# Patient Record
Sex: Male | Born: 1950 | Race: White | Hispanic: No | Marital: Married | Smoking: Never smoker
Health system: Southern US, Community
[De-identification: ages and names within clinical notes are randomized; demographics above are authoritative.]

## PROBLEM LIST (undated history)

## (undated) DIAGNOSIS — I10 Essential (primary) hypertension: Secondary | ICD-10-CM

## (undated) DIAGNOSIS — I455 Other specified heart block: Secondary | ICD-10-CM

## (undated) HISTORY — PX: SHOULDER ARTHROSCOPY: SHX128

---

## 2006-01-24 ENCOUNTER — Encounter: Admission: RE | Admit: 2006-01-24 | Discharge: 2006-01-24 | Payer: Self-pay | Admitting: *Deleted

## 2006-03-20 ENCOUNTER — Ambulatory Visit (HOSPITAL_BASED_OUTPATIENT_CLINIC_OR_DEPARTMENT_OTHER): Admission: RE | Admit: 2006-03-20 | Discharge: 2006-03-20 | Payer: Self-pay | Admitting: Orthopedic Surgery

## 2006-12-08 ENCOUNTER — Emergency Department (HOSPITAL_COMMUNITY): Admission: EM | Admit: 2006-12-08 | Discharge: 2006-12-08 | Payer: Self-pay | Admitting: *Deleted

## 2012-02-26 ENCOUNTER — Ambulatory Visit (INDEPENDENT_AMBULATORY_CARE_PROVIDER_SITE_OTHER): Payer: BC Managed Care – PPO | Admitting: Sports Medicine

## 2012-02-26 ENCOUNTER — Encounter: Payer: Self-pay | Admitting: Sports Medicine

## 2012-02-26 VITALS — BP 123/79 | Ht 69.0 in | Wt 185.0 lb

## 2012-02-26 DIAGNOSIS — M25519 Pain in unspecified shoulder: Secondary | ICD-10-CM

## 2012-02-26 DIAGNOSIS — M67919 Unspecified disorder of synovium and tendon, unspecified shoulder: Secondary | ICD-10-CM

## 2012-02-26 NOTE — Progress Notes (Signed)
  Subjective:    Patient ID: Charles Ellison, male    DOB: 1950-08-22, 61 y.o.   MRN: [id number]  HPI patient is a 61 year old right-hand-dominant male that comes in today complaining of 1 year of right shoulder pain. No recent trauma, but he does state that he fell on his right shoulder on some ice several years ago. Main complaint is a diffuse pain deep in the shoulder which is worse with activities such as throwing a baseball. He's begun to notice some limited range of motion as well some weakness in this arm. No neck pain, no associated numbness or tingling. No significant nighttime pain. His primary care physician has prescribed him some physical therapy but no other treatment. No prior surgery on the right shoulder, but he did have surgery on the left shoulder done by Dr. Teressa Senter several years ago. This sounds like it was a rotator cuff debridement. He has done well postoperatively.   He takes lisinopril for hypertension. No known drug allergies. Family history noncontributory. 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    Review of Systems     Objective:   Physical Exam Well-developed and well-nourished, no acute distress. Awake alert and oriented x3. Right shoulder: Active forward flexion to 160, active abduction to 140. External rotation is 70. Internal rotation is 90 but painful. Negative Spurling's. No tenderness over the contour of the a.c. Joint. No tenderness over the bicipital groove. His strength is 5/5 and slightly reproducible pain with resisted supraspinatus. Negative O'Brien's. No muscle atrophy skin is intact. He is neurovascularly intact distally.  Left shoulder: Full range of motion, no tenderness to palpation. Strength is 5/5, sensation is intact to light-touch. Skin is intact. Neurovascular intact distally.  MSK ultrasound right shoulder: Ultrasound images of the right shoulder were obtained in both  the longitudinal and transverse planes. Patient shows areas of hypoechogenicity in the supraspinatus tendon which may suggest a small tear here. I do not appreciate any full-thickness rotator cuff tear. Biceps tendon sheath has a fair amount of fluid but no discrete tear is seen. This could suggest possible labral pathology. The humeral joint was visualized the labrum was difficult to see. Subscapularis and interspinalis tendons appear to be intact. A.c. joint has small amount of fluid.       Assessment & Plan:  #1. Right shoulder pain secondary to rotator cuff tendinopathy versus possible degenerative labral tear  After informed consent the patient's right shoulder was prepped in a sterile fashion. Intra-articular cortisone injection was administered into the right glenohumeral joint. This was 40 mg of Depo-Medrol and 3 cc of Marcaine. Patient tolerated the procedure without any difficulty. Patient will continue with range of motion exercises at home and will followup with me in 4 weeks. If symptoms persist we may consider merits of MRI arthrogram to rule out a degenerative labral tear. Call questions or concerns in the interim.

## 2012-03-25 ENCOUNTER — Ambulatory Visit (INDEPENDENT_AMBULATORY_CARE_PROVIDER_SITE_OTHER): Payer: BC Managed Care – PPO | Admitting: Sports Medicine

## 2012-03-25 ENCOUNTER — Encounter: Payer: Self-pay | Admitting: Sports Medicine

## 2012-03-25 VITALS — BP 129/81 | HR 50 | Ht 69.0 in | Wt 185.0 lb

## 2012-03-25 DIAGNOSIS — M25511 Pain in right shoulder: Secondary | ICD-10-CM

## 2012-03-25 DIAGNOSIS — M25519 Pain in unspecified shoulder: Secondary | ICD-10-CM

## 2012-03-25 NOTE — Patient Instructions (Addendum)
You have been scheduled for an appointment for MRI 03/26/12 at 4:30 pm at [facility] [address] [place] [address].  Phone number is [phone].  Please go to their office today to have an x-ray of you shoulder.   Thank you for seeing [place] today!

## 2012-03-25 NOTE — Progress Notes (Addendum)
  Subjective:    Patient ID: Charles Ellison, male    DOB: 1951/07/26, 61 y.o.   MRN: [id number]  HPI Patient comes in today for followup on his right shoulder pain. Still struggling with pain despite subacromial cortisone injection. Recent muscle skeletal ultrasound suggested a possible partial thickness rotator cuff tear. He's had one year of symptoms. Has failed conservative treatment to date including physical therapy. He had a similar problem in the left shoulder several years ago that responded well to surgery.   Review of Systems     Objective:   Physical Exam Fairly good range of motion but some limitation with internal rotation. Markedly positive Hawkins. Slightly positive empty can. Rotator cuff strength 5/5. Neurovascular is intact distally.       Assessment & Plan:  Chronic right shoulder pain likely secondary to partial thickness rotator cuff tear-rule out full-thickness rotator cuff  History of prior left shoulder arthroscopy done by Dr. Teressa Senter  MRI of the right shoulder. AP and outlet x-rays. I expect that I will be referring him to Dr. Teressa Senter I will call him with MRI findings once available we will delineate further treatment at that time.   03/27/2012 Addendum to note above  I spoke with the patient today on the phone regarding MRI findings of his right shoulder. He has evidence of a degenerative labral tear. Minimal rotator cuff tendinopathy. Again, his symptoms have been present now for about a year and for that reason I wore refer him to Dr. Teressa Senter for definitive treatment.

## 2012-03-26 ENCOUNTER — Encounter: Payer: Self-pay | Admitting: Sports Medicine

## 2012-03-27 ENCOUNTER — Encounter: Payer: Self-pay | Admitting: *Deleted

## 2012-03-27 NOTE — Progress Notes (Signed)
Patient ID: Charles Ellison, male   DOB: 05/02/1951, 61 y.o.   MRN: [id number]   Pt scheduled for appt with Dr. Teressa Senter 04/03/12 at 10:30 am - arrive at 10 am.  Pt notified of appt via phone.

## 2012-03-28 ENCOUNTER — Ambulatory Visit: Payer: Self-pay | Admitting: Sports Medicine

## 2012-03-28 ENCOUNTER — Encounter: Payer: Self-pay | Admitting: Sports Medicine

## 2012-04-02 ENCOUNTER — Ambulatory Visit: Payer: BC Managed Care – PPO | Admitting: Sports Medicine

## 2014-09-30 ENCOUNTER — Ambulatory Visit (INDEPENDENT_AMBULATORY_CARE_PROVIDER_SITE_OTHER): Payer: BLUE CROSS/BLUE SHIELD | Admitting: Podiatry

## 2014-09-30 ENCOUNTER — Encounter: Payer: Self-pay | Admitting: Podiatry

## 2014-09-30 VITALS — BP 139/76 | HR 58 | Ht 70.0 in | Wt 190.0 lb

## 2014-09-30 DIAGNOSIS — L608 Other nail disorders: Secondary | ICD-10-CM | POA: Diagnosis not present

## 2014-09-30 DIAGNOSIS — L603 Nail dystrophy: Secondary | ICD-10-CM

## 2014-09-30 NOTE — Patient Instructions (Signed)

## 2014-09-30 NOTE — Progress Notes (Signed)
Patient ID: Charles Ellison, male   DOB: 1950/10/07, 64 y.o.   MRN: [id number]  Subjective: 64 year old male presents the office with complaints of nail fungus on his right big toe. He states that he has had fungus there for several years and he is tried multiple treatments. He has attempted multiple over-the-counter treatments as well as home remedies. He states approximately 3 years ago he was on a course of Lamisil which helped some but is not cleared completely. He denies any pain associated with the nail any redness or drainage. No other complaints at this time.  Objective: AAO x3, NAD DP/PT pulses palpable bilaterally, CRT less than 3 seconds Protective sensation intact with Simms Weinstein monofilament, vibratory sensation intact, Achilles tendon reflex intact Right hallux nails hypertrophic, dystrophic, discolored, brittle. The remaining nails without pathology. There is no tenderness palpation along the nail this time and there is no swelling erythema or drainage. No other areas of tenderness to bilateral lower extremities. No overlying edema, erythema, increased warmth. MMT 5/5, ROM WNL No open lesions or pre-ulcerative lesions are identified. No pain with calf compression, swelling, warmth, erythema.  Assessment: 64 year old male with right onychodystrophy, likely onychomycosis  Plan: -Treatment options discussed including alternatives, risks, complications. -At the patient has previously had multiple treatments without resolution nail was biopsied and sent to [facility] labs for evaluation of onychomycosis. Discussed the various treatments of onychomycosis however we'll await the results of the biopsy before proceeding. -Follow-up after the biopsy results are obtained. In the meantime, encouraged to call the office with any questions, concerns, change in symptoms.

## 2014-11-19 ENCOUNTER — Telehealth: Payer: Self-pay | Admitting: *Deleted

## 2014-11-19 NOTE — Telephone Encounter (Signed)
Pt requests results of fungal testing.  I left a message informing pt that the testing of his nails would be back in 4-6 weeks and we would call with the results.

## 2014-12-03 ENCOUNTER — Encounter: Payer: Self-pay | Admitting: Podiatry

## 2014-12-09 ENCOUNTER — Ambulatory Visit (INDEPENDENT_AMBULATORY_CARE_PROVIDER_SITE_OTHER): Payer: BLUE CROSS/BLUE SHIELD | Admitting: Podiatry

## 2014-12-09 ENCOUNTER — Encounter: Payer: Self-pay | Admitting: Podiatry

## 2014-12-09 VITALS — BP 129/75 | HR 62 | Resp 18

## 2014-12-09 DIAGNOSIS — B351 Tinea unguium: Secondary | ICD-10-CM

## 2014-12-09 MED ORDER — ITRACONAZOLE 200 MG PO TABS: 1.0000 | ORAL_TABLET | Freq: Every day | ORAL | Status: DC

## 2014-12-09 MED ORDER — TERBINAFINE HCL 250 MG PO TABS
250.0000 mg | ORAL_TABLET | Freq: Every day | ORAL | Status: DC
Start: 2014-12-09 — End: 2014-12-09

## 2014-12-09 MED ORDER — EFINACONAZOLE 10 % EX SOLN: 1.0000 "application " | Freq: Every day | CUTANEOUS | Status: DC

## 2014-12-09 MED ORDER — TERBINAFINE HCL 250 MG PO TABS: 250.0000 mg | ORAL_TABLET | Freq: Every day | ORAL | Status: DC

## 2014-12-13 ENCOUNTER — Encounter: Payer: Self-pay | Admitting: Podiatry

## 2014-12-13 NOTE — Progress Notes (Signed)
Patient ID: Charles Ellison, male   DOB: [birth year], 64 y.o.   MRN: [id number]  Subjective: 64 year old male returns the office they discussed nail biopsy results. He denies any acute changes since last appointment. Denies any pain or any redness or drainage along the nail sites. He states he is previously been on Lamisil which helps somewhat however he states the fungus reoccurred.   Objective: AAO 3, NAD Neurovascular status unchanged Right hallux nail hypertrophic, dystrophic, discolored, brittle. There is no tenderness palpation along the nail is no surrounding erythema or drainage. The nails have some dystrophy however grossly unremarkable. No overlying edema, erythema, increase in warmth to bilateral lower extremities No areas of tenderness to bilateral lower extremity is. No pain with calf compression, swelling, warmth, erythema.  Assessment: 64 year old male with onychomycosis  Plan: -Nail culture results were obtained which revealed onychomycosis (T. Rubrum). I discussed various shaving options with the patient in regards to this. As he is previously had Lamisil we will try Onmel. He would also like to proceed with topical treatment as well. Prescribed Jublia. The scar side effects with with medications and directed to stop if any are to occur call the office. Follow up as needed. Call with questions or concerns.

## 2015-01-13 ENCOUNTER — Ambulatory Visit (INDEPENDENT_AMBULATORY_CARE_PROVIDER_SITE_OTHER): Payer: BLUE CROSS/BLUE SHIELD | Admitting: Sports Medicine

## 2015-01-13 VITALS — BP 128/64 | HR 57 | Temp 98.4°F | Resp 20 | Ht 69.0 in | Wt 191.4 lb

## 2015-01-13 DIAGNOSIS — H0011 Chalazion right upper eyelid: Secondary | ICD-10-CM | POA: Diagnosis not present

## 2015-01-13 MED ORDER — CIPROFLOXACIN HCL 0.3 % OP SOLN: 1.0000 [drp] | OPHTHALMIC | Status: DC

## 2015-01-13 NOTE — Progress Notes (Signed)
  Charles Ellison - 64 y.o. male MRN [id number]  Date of birth: 06/19/1951  SUBJECTIVE:  Including CC & ROS.  A 64 year old male presents today with right eye irritation. Patient is a foreign body stuck side of his upper eyelid. Denies any itching, redness or purulent drainage. He is not tried any warm compresses has some artificial tears no relief. Denies any history of the past.   ROS:  Constitutional:  No fever, chills, or fatigue.  Respiratory:  No shortness of breath, cough, or wheezing Cardiovascular:  No palpitations, chest pain or syncope Gastrointestinal:  No nausea, no abdominal pain Review of systems otherwise negative except for what is stated in HPI  HISTORY: Past Medical, Surgical, Social, and Family History Reviewed & Updated per EMR. Pertinent Historical Findings include: Hypertension  PHYSICAL EXAM:  VS: BP:128/64 mmHg  HR:(!) 57bpm  TEMP:98.4 F (36.9 C)(Oral)  RESP:97 %  HT:5\' 9"  (175.3 cm)   WT:191 lb 6 oz (86.807 kg)  BMI:28.3 PHYSICAL EXAM: General:  Alert and oriented, No acute distress.   HENT:  Normocephalic, Oral mucosa is moist.  Patient's exam revealed normal ocular motion. Pupils were equal and react to light and accommodating. fluorescein exam of the eye with Wood's lamp revealed no signs of renal abrasion. Eyelid exam revealed a small area of swelling on the upper eyelid consistent with a very early chalazion.  Respiratory:  Lungs are clear to auscultation, Respirations are non-labored, Symmetrical chest wall expansion.   Cardiovascular:  Normal rate, Regular rhythm, No murmur, Good pulses equal in all extremities, No edema.   Gastrointestinal:  Soft, Non-tender, Non-distended, Normal bowel sounds, No organomegaly.   Integumentary:  Warm, Dry, No rash.   Neurologic:  Alert, Oriented, No focal defects Psychiatric:  Cooperative, Appropriate mood & affect.    ASSESSMENT & PLAN:  Impression: Small early on upper eyelid  Chalazion  Recommendations: -Advised patient compresses on daily for the next 5 days to help with the granular tissue swelling. -Advised patient about therapy is not recommended typically. Provided patient with ciprofloxacin more apparent in 5 days he may start drop at that time -Advised patient follow swelling and nodular becomes larger and likely needs lancing

## 2015-01-13 NOTE — Patient Instructions (Addendum)
Chalazion A chalazion is a swelling or hard lump on the eyelid caused by a blocked oil gland. Chalazions may occur on the upper or the lower eyelid.  CAUSES  Oil gland in the eyelid becomes blocked. SYMPTOMS   Swelling or hard lump on the eyelid. This lump may make it hard to see out of the eye.  The swelling may spread to areas around the eye. TREATMENT  Treatment - Antibiotics are not indicated since chalazion is a granulomatous condition. Small chalazia often resolve without intervention. For larger lesions, frequent hot compresses may allow them to drain although typically most clear spontaneously in weeks to months. Symptomatic patients with recalcitrant lesions can be referred to an ophthalmologist for incision and curettage or direct glucocorticoid injection Independence your hands often and dry them with a clean towel. Do not touch the chalazion.  Apply heat to the eyelid several times a day for 10 minutes to help ease discomfort and bring any yellowish white fluid (pus) to the surface. One way to apply heat to a chalazion is to use the handle of a metal spoon.  Hold the handle under hot water until it is hot, and then wrap the handle in paper towels so that the heat can come through without burning your skin.  Hold the wrapped handle against the chalazion and reheat the spoon handle as needed.  Apply heat in this fashion for 10 minutes, 4 times per day.  Return to your caregiver to have the pus removed if it does not break (rupture) on its own.  Do not try to remove the pus yourself by squeezing the chalazion or sticking it with a pin or needle.  Only take over-the-counter or prescription medicines for pain, discomfort, or fever as directed by your caregiver. SEEK IMMEDIATE MEDICAL CARE IF:   You have pain in your eye.  Your vision changes.  The chalazion does not go away.  The chalazion becomes painful, red, or swollen, grows larger, or does not start to  disappear after 2 weeks. MAKE SURE YOU:   Understand these instructions.  Will watch your condition.  Will get help right away if you are not doing well or get worse. Document Released: 07/28/2000 Document Revised: 10/23/2011 Document Reviewed: 11/15/2009 [facility] Patient Information 2015 [place], [place]. This information is not intended to replace advice given to you by your health care provider. Make sure you discuss any questions you have with your health care provider.

## 2016-05-25 DIAGNOSIS — L821 Other seborrheic keratosis: Secondary | ICD-10-CM | POA: Diagnosis not present

## 2016-05-25 DIAGNOSIS — D235 Other benign neoplasm of skin of trunk: Secondary | ICD-10-CM | POA: Diagnosis not present

## 2016-05-25 DIAGNOSIS — D1801 Hemangioma of skin and subcutaneous tissue: Secondary | ICD-10-CM | POA: Diagnosis not present

## 2016-05-25 DIAGNOSIS — L814 Other melanin hyperpigmentation: Secondary | ICD-10-CM | POA: Diagnosis not present

## 2016-05-25 DIAGNOSIS — L570 Actinic keratosis: Secondary | ICD-10-CM | POA: Diagnosis not present

## 2016-11-02 DIAGNOSIS — H524 Presbyopia: Secondary | ICD-10-CM | POA: Diagnosis not present

## 2016-11-02 DIAGNOSIS — H43391 Other vitreous opacities, right eye: Secondary | ICD-10-CM | POA: Diagnosis not present

## 2016-11-02 DIAGNOSIS — H2513 Age-related nuclear cataract, bilateral: Secondary | ICD-10-CM | POA: Diagnosis not present

## 2016-11-02 DIAGNOSIS — H25043 Posterior subcapsular polar age-related cataract, bilateral: Secondary | ICD-10-CM | POA: Diagnosis not present

## 2016-11-22 DIAGNOSIS — N529 Male erectile dysfunction, unspecified: Secondary | ICD-10-CM | POA: Diagnosis not present

## 2016-11-22 DIAGNOSIS — R21 Rash and other nonspecific skin eruption: Secondary | ICD-10-CM | POA: Diagnosis not present

## 2016-11-22 DIAGNOSIS — E782 Mixed hyperlipidemia: Secondary | ICD-10-CM | POA: Diagnosis not present

## 2016-11-22 DIAGNOSIS — Z0000 Encounter for general adult medical examination without abnormal findings: Secondary | ICD-10-CM | POA: Diagnosis not present

## 2016-11-22 DIAGNOSIS — Z125 Encounter for screening for malignant neoplasm of prostate: Secondary | ICD-10-CM | POA: Diagnosis not present

## 2016-11-22 DIAGNOSIS — I10 Essential (primary) hypertension: Secondary | ICD-10-CM | POA: Diagnosis not present

## 2016-11-22 DIAGNOSIS — Z79899 Other long term (current) drug therapy: Secondary | ICD-10-CM | POA: Diagnosis not present

## 2016-12-25 DIAGNOSIS — R001 Bradycardia, unspecified: Secondary | ICD-10-CM | POA: Diagnosis not present

## 2016-12-25 DIAGNOSIS — I952 Hypotension due to drugs: Secondary | ICD-10-CM | POA: Diagnosis not present

## 2016-12-27 ENCOUNTER — Encounter (HOSPITAL_COMMUNITY): Payer: Self-pay | Admitting: *Deleted

## 2016-12-27 ENCOUNTER — Inpatient Hospital Stay (HOSPITAL_COMMUNITY)
Admission: EM | Admit: 2016-12-27 | Discharge: 2016-12-29 | DRG: 244 | Disposition: A | Discharge status: Home / Self Care | Payer: BLUE CROSS/BLUE SHIELD | Attending: Cardiology | Admitting: Cardiology

## 2016-12-27 DIAGNOSIS — Z950 Presence of cardiac pacemaker: Secondary | ICD-10-CM | POA: Diagnosis not present

## 2016-12-27 DIAGNOSIS — I952 Hypotension due to drugs: Secondary | ICD-10-CM | POA: Diagnosis not present

## 2016-12-27 DIAGNOSIS — R55 Syncope and collapse: Secondary | ICD-10-CM | POA: Diagnosis not present

## 2016-12-27 DIAGNOSIS — R001 Bradycardia, unspecified: Secondary | ICD-10-CM | POA: Diagnosis not present

## 2016-12-27 DIAGNOSIS — I10 Essential (primary) hypertension: Secondary | ICD-10-CM | POA: Diagnosis not present

## 2016-12-27 DIAGNOSIS — I495 Sick sinus syndrome: Secondary | ICD-10-CM | POA: Diagnosis not present

## 2016-12-27 DIAGNOSIS — I455 Other specified heart block: Secondary | ICD-10-CM | POA: Diagnosis present

## 2016-12-27 DIAGNOSIS — Z95818 Presence of other cardiac implants and grafts: Secondary | ICD-10-CM

## 2016-12-27 DIAGNOSIS — E782 Mixed hyperlipidemia: Secondary | ICD-10-CM | POA: Diagnosis not present

## 2016-12-27 HISTORY — DX: Essential (primary) hypertension: I10

## 2016-12-27 HISTORY — DX: Other specified heart block: I45.5

## 2016-12-27 LAB — BASIC METABOLIC PANEL
Anion gap: 7 (ref 5–15)
BUN: 16 mg/dL (ref 6–20)
CALCIUM: 9.2 mg/dL (ref 8.9–10.3)
CO2: 26 mmol/L (ref 22–32)
CREATININE: 1.03 mg/dL (ref 0.61–1.24)
Chloride: 108 mmol/L (ref 101–111)
Glucose, Bld: 95 mg/dL (ref 65–99)
Potassium: 4.1 mmol/L (ref 3.5–5.1)
SODIUM: 141 mmol/L (ref 135–145)

## 2016-12-27 LAB — CBC
HCT: 45.7 % (ref 39.0–52.0)
Hemoglobin: 15.1 g/dL (ref 13.0–17.0)
MCH: 30.7 pg (ref 26.0–34.0)
MCHC: 33 g/dL (ref 30.0–36.0)
MCV: 92.9 fL (ref 78.0–100.0)
PLATELETS: 151 10*3/uL (ref 150–400)
RBC: 4.92 MIL/uL (ref 4.22–5.81)
RDW: 12.6 % (ref 11.5–15.5)
WBC: 7.2 10*3/uL (ref 4.0–10.5)

## 2016-12-27 LAB — URINALYSIS, ROUTINE W REFLEX MICROSCOPIC
Bilirubin Urine: NEGATIVE
Glucose, UA: NEGATIVE mg/dL
Hgb urine dipstick: NEGATIVE
KETONES UR: NEGATIVE mg/dL
LEUKOCYTES UA: NEGATIVE
Nitrite: NEGATIVE
PROTEIN: NEGATIVE mg/dL
Specific Gravity, Urine: 1.02 (ref 1.005–1.030)
pH: 5 (ref 5.0–8.0)

## 2016-12-27 LAB — SURGICAL PCR SCREEN
MRSA, PCR: NEGATIVE
Staphylococcus aureus: NEGATIVE

## 2016-12-27 LAB — I-STAT TROPONIN, ED: TROPONIN I, POC: 0 ng/mL (ref 0.00–0.08)

## 2016-12-27 LAB — MAGNESIUM: MAGNESIUM: 2.1 mg/dL (ref 1.7–2.4)

## 2016-12-27 LAB — TSH: TSH: 2.145 u[IU]/mL (ref 0.350–4.500)

## 2016-12-27 MED ORDER — OFF THE BEAT BOOK
Freq: Once | Status: AC
  Administered 2016-12-27: 21:00:00 1
  Filled 2016-12-27: qty 1

## 2016-12-27 MED ORDER — CHLORHEXIDINE GLUCONATE 4 % EX LIQD
60.0000 mL | Freq: Once | CUTANEOUS | Status: AC
  Administered 2016-12-28: 4 via TOPICAL
  Filled 2016-12-27: qty 60

## 2016-12-27 MED ORDER — YOU HAVE A PACEMAKER BOOK
Freq: Once | Status: AC
  Administered 2016-12-27: 21:00:00 1
  Filled 2016-12-27: qty 1

## 2016-12-27 MED ORDER — SODIUM CHLORIDE 0.9 % IV SOLN
INTRAVENOUS | Status: DC
  Administered 2016-12-28: 06:00:00 1000 mL via INTRAVENOUS

## 2016-12-27 MED ORDER — SODIUM CHLORIDE 0.9 % IR SOLN
80.0000 mg | Status: AC
  Administered 2016-12-28: 80 mg

## 2016-12-27 MED ORDER — CEFAZOLIN SODIUM-DEXTROSE 2-4 GM/100ML-% IV SOLN
2.0000 g | INTRAVENOUS | Status: AC
  Administered 2016-12-28: 2 g via INTRAVENOUS

## 2016-12-27 MED ORDER — SODIUM CHLORIDE 0.45 % IV SOLN
INTRAVENOUS | Status: DC
Start: 2016-12-28 — End: 2016-12-28
  Administered 2016-12-28: 08:00:00 via INTRAVENOUS

## 2016-12-27 MED ORDER — CHLORHEXIDINE GLUCONATE 4 % EX LIQD
60.0000 mL | Freq: Once | CUTANEOUS | Status: AC
Start: 2016-12-27 — End: 2016-12-27
  Administered 2016-12-27: 4 via TOPICAL
  Filled 2016-12-27: qty 60

## 2016-12-27 NOTE — ED Provider Notes (Signed)
[facility] DEPT Provider Note   CSN: [id number] Arrival date & time: 12/27/16  1333     History   Chief Complaint Chief Complaint  Patient presents with  . Dizziness  . Bradycardia    HPI Charles Ellison. is a 66 y.o. male.  HPI   66 yo M w/ h/o DM and HTN here with a couple weeks of positional dizziness/llightheadedness. Saw his PCP on Monday, ecg shows junctional rhythm, stopped lisinopril and went home. Continued worsening symptoms, recheck today and still junctional rhythm so sent here for eval. Worsened over last couple days as well. No recent illnesses. Did start a probiotic a few weeks ago but no other new medications or travels.   No CP, SOB or other associated sy mptoms.   Past Medical History:  Diagnosis Date  . Essential hypertension   . 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 PACEMAKER IMPLANT N/A 12/28/2016   Procedure: Pacemaker Implant;  Surgeon: Constance Haw, MD;  Location: [facility];  Service: Cardiovascular;  Laterality: N/A;  . SHOULDER ARTHROSCOPY Left ~ 2005   "for calcium deposits; impinged shoulder"       Home Medications    Prior to Admission medications   Medication Sig Start Date End Date Taking? Authorizing Provider  aspirin EC 81 MG tablet Take 81 mg by mouth daily.   Yes [provider]  Probiotic Product (PROBIOTIC PO) Take 1 tablet by mouth daily.   Yes [provider]    Family History Family History  Problem Relation Age of Onset  . Heart disease Mother        died at 1 unclear heart condition  . Valvular heart disease Father        died at 74, 2/2 traumatic SDH    Social History Social History  Substance Use Topics  . Smoking status: Never Smoker  . Smokeless tobacco: Never Used  . Alcohol use 0.0 oz/week     Comment: 12/27/2016 "might  have 1 drink/month"     Allergies   Patient has no known allergies.   Review of Systems Review of Systems  All other systems reviewed and are negative.    Physical Exam Updated Vital Signs BP 133/70 (BP Location: Right Arm)   Pulse 61   Temp 98.2 F (36.8 C) (Oral)   Resp 20   Ht 5\' 10"  (1.778 m)   Wt 184 lb (83.5 kg)   SpO2 94%   BMI 26.40 kg/m   Physical Exam  Constitutional: He is oriented to person, place, and time. He appears well-developed and well-nourished.  HENT:  Head: Normocephalic and atraumatic.  Eyes: Conjunctivae and EOM are normal.  Neck: Normal range of motion.  Cardiovascular: Regular rhythm.  Bradycardia present.   Pulmonary/Chest: Effort normal. No respiratory distress.  Abdominal: Soft. Bowel sounds are normal. He exhibits no distension. There is no tenderness.  Musculoskeletal: Normal range of motion. He exhibits no edema or deformity.  Neurological: He is alert and oriented to person, place, and time. No cranial nerve deficit. Coordination normal.  Nursing note and vitals reviewed.    ED Treatments / Results  Labs (all labs ordered are listed, but only abnormal results are displayed) Labs Reviewed  SURGICAL PCR SCREEN  BASIC METABOLIC PANEL  CBC  URINALYSIS, ROUTINE W REFLEX MICROSCOPIC  MAGNESIUM  HIV ANTIBODY (ROUTINE TESTING)  TSH  I-STAT TROPOININ, ED    EKG  EKG Interpretation  Date/Time:  Wednesday Dec 27 2016 13:54:44 EDT Ventricular Rate:  42 PR Interval:    QRS Duration: 90 QT Interval:  454 QTC Calculation: 379 R Axis:   45 Text Interpretation:  Marked sinus bradycardia Abnormal ECG bradiacardia is worsened otherwise no significant change Confirmed by Isla Pence [id number]) on 12/29/2016 4:55:00 AM       Radiology Dg Chest 2 View  Result Date: 12/29/2016 CLINICAL DATA:  Status post pacemaker placement. EXAM: CHEST  2 VIEW COMPARISON:  None. FINDINGS: The heart size and mediastinal contours are within normal  limits. Both lungs are clear. No pneumothorax or pleural effusion is noted. Left-sided pacemaker is noted with leads in grossly good position. The visualized skeletal structures are unremarkable. IMPRESSION: No active cardiopulmonary disease. Left-sided pacemaker is noted with leads in grossly good position. Electronically Signed   By: Marijo Conception, M.D.   On: 12/29/2016 07:23    Procedures Procedures (including critical care time)  Medications Ordered in ED Medications  acetaminophen (TYLENOL) tablet 325-650 mg (650 mg Oral Given 12/29/16 0110)  ondansetron (ZOFRAN) injection 4 mg (not administered)  ceFAZolin (ANCEF) IVPB 1 g/50 mL premix (0 g Intravenous Stopped 12/29/16 0138)  you have a pacemaker book (1 each Does not apply Given 12/27/16 2125)  off the beat book (1 each Does not apply Given 12/27/16 2120)  gentamicin (GARAMYCIN) 80 mg in sodium chloride irrigation 0.9 % 500 mL irrigation (80 mg Irrigation Given 12/28/16 1526)  chlorhexidine (HIBICLENS) 4 % liquid 4 application (4 application Topical Given 12/27/16 2235)  chlorhexidine (HIBICLENS) 4 % liquid 4 application (4 application Topical Given 12/28/16 0615)  ceFAZolin (ANCEF) IVPB 2g/100 mL premix (0 g Intravenous Stopped 12/28/16 1452)  heparin infusion 2 units/mL in 0.9 % sodium chloride (500 mLs Other New Bag/Given 12/28/16 1431)     Initial Impression / Assessment and Plan / ED Course  I have reviewed the triage vital signs and the nursing notes.  Pertinent labs & imaging results that were available during my care of the patient were reviewed by me and considered in my medical decision making (see chart for details).     Suspect possible symptomatic bradycardia, cardiology consulted and will admit for consideration of pacemaker for likely sick sinus syndrome.   Final Clinical Impressions(s) / ED Diagnoses   Final diagnoses:  Cardiac device in situ, other      Vinod Mikesell, Corene Cornea, MD 12/29/16 1200

## 2016-12-27 NOTE — ED Triage Notes (Signed)
Pt reports feeling weak and lightheaded for several weeks. Dizzy when standing up. Pt went to pcp and sent here due to HR 40.Charles Ellison

## 2016-12-27 NOTE — H&P (Signed)
Charles Ellison. is an 66 y.o. male.   Chief Complaint: Near syncope and dizziness HPI: Charles Ellison.  is a 66 y.o. male  With mild hypertension, otherwise no other significant past medical history, normal lipids, not a diabetic, nonsmoker who exercises regularly, was the past 2 weeks or so has noticed slight decrease in his exercise tolerance and episodes of unexplained dizziness and near syncope. He is seen by Dr. Louisa Second today and was found to be in junctional rhythm along with bradycardia and hence was advised to go to the emergency room.  Otherwise asymptomatic, he walked 3 miles yesterday, usually they walk 5 miles and he is scheduled to make a trip to [place] where he'll be walking for about 18 miles in a day sometime in September 2018. His wife is present the bedside.   History reviewed. No pertinent surgical history.  History reviewed. No pertinent family history. There is no premature coronary artery disease in the family. Social History:  reports that he has never smoked. He has never used smokeless tobacco. His alcohol and drug histories are not on file. He drinks alcoholic very rarely on a social basis.  Allergies: No Known Allergies  Review of Systems - Negative except Dizziness and near syncope. No TIA, no neurologic weakness, no leg edema, no recent weight changes.    Blood pressure 137/80, pulse (!) 52, temperature 97.7 F (36.5 C), temperature source Oral, resp. rate (!) 21, SpO2 95 %. There is no height or weight on file to calculate BMI.  General appearance: alert, cooperative, appears stated age and no distress Eyes: negative findings: lids and lashes normal Neck: no adenopathy, no carotid bruit, no JVD, supple, symmetrical, trachea midline and thyroid not enlarged, symmetric, no tenderness/mass/nodules Neck: JVP - normal, carotids 2+= without bruits Resp: clear to auscultation bilaterally Chest wall: no tenderness Cardio: regular rate and rhythm, S1, S2  normal, no murmur, click, rub or gallop and Markedly bradycardic. GI: soft, non-tender; bowel sounds normal; no masses,  no organomegaly Extremities: extremities normal, atraumatic, no cyanosis or edema Pulses: 2+ and symmetric Skin: Skin color, texture, turgor normal. No rashes or lesions Neurologic: Grossly normal  Results for orders placed or performed during the hospital encounter of 12/27/16 (from the past 48 hour(s))  Basic metabolic panel     Status: None   Collection Time: 12/27/16  2:00 PM  Result Value Ref Range   Sodium 141 135 - 145 mmol/L   Potassium 4.1 3.5 - 5.1 mmol/L   Chloride 108 101 - 111 mmol/L   CO2 26 22 - 32 mmol/L   Glucose, Bld 95 65 - 99 mg/dL   BUN 16 6 - 20 mg/dL   Creatinine, Ser 1.03 0.61 - 1.24 mg/dL   Calcium 9.2 8.9 - 10.3 mg/dL   GFR calc non Af Amer >60 >60 mL/min   GFR calc Af Amer >60 >60 mL/min    Comment: (NOTE) The eGFR has been calculated using the CKD EPI equation. This calculation has not been validated in all clinical situations. eGFR's persistently <60 mL/min signify possible Chronic Kidney Disease.    Anion gap 7 5 - 15  CBC     Status: None   Collection Time: 12/27/16  2:00 PM  Result Value Ref Range   WBC 7.2 4.0 - 10.5 K/uL   RBC 4.92 4.22 - 5.81 MIL/uL   Hemoglobin 15.1 13.0 - 17.0 g/dL   HCT 45.7 39.0 - 52.0 %   MCV 92.9 78.0 -  100.0 fL   MCH 30.7 26.0 - 34.0 pg   MCHC 33.0 30.0 - 36.0 g/dL   RDW 12.6 11.5 - 15.5 %   Platelets 151 150 - 400 K/uL  I-Stat Troponin, ED (not at [facility])     Status: None   Collection Time: 12/27/16  2:13 PM  Result Value Ref Range   Troponin i, poc 0.00 0.00 - 0.08 ng/mL   Comment 3            Comment: Due to the release kinetics of cTnI, a negative result within the first hours of the onset of symptoms does not rule out myocardial infarction with certainty. If myocardial infarction is still suspected, repeat the test at appropriate intervals.   Urinalysis, Routine w reflex microscopic      Status: None   Collection Time: 12/27/16  3:52 PM  Result Value Ref Range   Color, Urine YELLOW YELLOW   APPearance CLEAR CLEAR   Specific Gravity, Urine 1.020 1.005 - 1.030   pH 5.0 5.0 - 8.0   Glucose, UA NEGATIVE NEGATIVE mg/dL   Hgb urine dipstick NEGATIVE NEGATIVE   Bilirubin Urine NEGATIVE NEGATIVE   Ketones, ur NEGATIVE NEGATIVE mg/dL   Protein, ur NEGATIVE NEGATIVE mg/dL   Nitrite NEGATIVE NEGATIVE   Leukocytes, UA NEGATIVE NEGATIVE    Labs:   Lab Results  Component Value Date   WBC 7.2 12/27/2016   HGB 15.1 12/27/2016   HCT 45.7 12/27/2016   MCV 92.9 12/27/2016   PLT 151 12/27/2016    Recent Labs Lab 12/27/16 1400  NA 141  K 4.1  CL 108  CO2 26  BUN 16  CREATININE 1.03  CALCIUM 9.2  GLUCOSE 95    Lipid Panel  No results found for: CHOL, TRIG, HDL, CHOLHDL, VLDL, LDLCALC  BNP (last 3 results) No results for input(s): BNP in the last 8760 hours.  HEMOGLOBIN A1C No results found for: HGBA1C, MPG  Cardiac Panel (last 3 results) No results for input(s): CKTOTAL, CKMB, TROPONINI, RELINDX in the last 8760 hours.  No results found for: CKTOTAL, CKMB, CKMBINDEX, TROPONINI   TSH No results for input(s): TSH in the last 8760 hours.   No current facility-administered medications for this encounter.   Current Outpatient Prescriptions:  .  aspirin EC 81 MG tablet, Take 81 mg by mouth daily., Disp: , Rfl:  .  Probiotic Product (PROBIOTIC PO), Take 1 tablet by mouth daily., Disp: , Rfl:  .  ciprofloxacin (CILOXAN) 0.3 % ophthalmic solution, Place 1 drop into the right eye every 4 (four) hours. x 5 days (Patient not taking: Reported on 12/27/2016), Disp: 5 mL, Rfl: 0  CARDIAC STUDIES:  EKG 12/27/2016: Marked sinus bradycardia at a rate of 52 bpm. Repeat EKGs show sinus arrest with junctional escape at a rate of 32 bpm, junctional escape as low as 28 bpm.   ECHO pending  Assessment/Plan 1. Near syncope secondary to sick sinus syndrome. 2. Sinus node  dysfunction 3. Hypertension  Recommendation: Patient is very healthy, physical examination is unremarkable, no explanation for his sinus arrest. No clinical suggestion of any connective tissue disorders, not anemic and not jaundiced. He will need permanent pacemaker implantation.   I had a very long discussion with the patient and his wife at the bedside, who agreed very healthy lifestyle, they had multiple questions regarding sick sinus syndrome, types of pacemaker and lifestyle issues that may come about. Also discussed regarding symptoms of complete heart block, exercise intolerance. After long discussion,  they're in agreement to proceed with pacemaker implantation. I'll obtain an echocardiogram to exclude any structural heart disease. I do not suspect ischemic etiology, patient exercises on a regular basis, they walk at least 4-5 days a week for 5 miles without any limitations. Also we could certainly consider stress testing at a later date. I discussed with Dr. Thompson Grayer who will see the patient in the morning.  Adrian Prows, MD 12/27/2016, 5:08 PM [facility] [place] Pager: [phone] Office: [phone] If no answer: Cell:  [phone]

## 2016-12-28 ENCOUNTER — Other Ambulatory Visit (HOSPITAL_COMMUNITY): Payer: BLUE CROSS/BLUE SHIELD

## 2016-12-28 ENCOUNTER — Inpatient Hospital Stay (HOSPITAL_COMMUNITY): Payer: BLUE CROSS/BLUE SHIELD

## 2016-12-28 ENCOUNTER — Encounter (HOSPITAL_COMMUNITY): Payer: Self-pay | Admitting: Physician Assistant

## 2016-12-28 ENCOUNTER — Inpatient Hospital Stay (HOSPITAL_COMMUNITY)
Admission: EM | Disposition: A | Discharge status: Home / Self Care | Payer: Self-pay | Source: Home / Self Care | Attending: Cardiology

## 2016-12-28 DIAGNOSIS — I495 Sick sinus syndrome: Secondary | ICD-10-CM

## 2016-12-28 DIAGNOSIS — Z950 Presence of cardiac pacemaker: Secondary | ICD-10-CM

## 2016-12-28 HISTORY — PX: PACEMAKER IMPLANT: EP1218

## 2016-12-28 LAB — HIV ANTIBODY (ROUTINE TESTING W REFLEX): HIV Screen 4th Generation wRfx: NONREACTIVE

## 2016-12-28 LAB — ECHOCARDIOGRAM COMPLETE
Height: 70 in
WEIGHTICAEL: 2944 [oz_av]

## 2016-12-28 SURGERY — PACEMAKER IMPLANT

## 2016-12-28 MED ORDER — GENTAMICIN SULFATE 40 MG/ML IJ SOLN
INTRAMUSCULAR | Status: AC
  Filled 2016-12-28: qty 2

## 2016-12-28 MED ORDER — CEFAZOLIN SODIUM-DEXTROSE 1-4 GM/50ML-% IV SOLN
1.0000 g | Freq: Four times a day (QID) | INTRAVENOUS | Status: DC
  Administered 2016-12-28 – 2016-12-29 (×2): 1 g via INTRAVENOUS
  Filled 2016-12-28 (×3): qty 50

## 2016-12-28 MED ORDER — CEFAZOLIN SODIUM-DEXTROSE 2-4 GM/100ML-% IV SOLN
INTRAVENOUS | Status: AC
Start: 2016-12-28 — End: 2016-12-28
  Filled 2016-12-28: qty 100

## 2016-12-28 MED ORDER — HEPARIN (PORCINE) IN NACL 2-0.9 UNIT/ML-% IJ SOLN
INTRAMUSCULAR | Status: AC
Start: 2016-12-28 — End: 2016-12-28
  Filled 2016-12-28: qty 500

## 2016-12-28 MED ORDER — ONDANSETRON HCL 4 MG/2ML IJ SOLN: 4.0000 mg | Freq: Four times a day (QID) | INTRAMUSCULAR | Status: DC | PRN

## 2016-12-28 MED ORDER — FENTANYL CITRATE (PF) 100 MCG/2ML IJ SOLN
INTRAMUSCULAR | Status: DC | PRN
  Administered 2016-12-28 (×2): 25 ug via INTRAVENOUS

## 2016-12-28 MED ORDER — FENTANYL CITRATE (PF) 100 MCG/2ML IJ SOLN
INTRAMUSCULAR | Status: AC
  Filled 2016-12-28: qty 2

## 2016-12-28 MED ORDER — MIDAZOLAM HCL 5 MG/5ML IJ SOLN
INTRAMUSCULAR | Status: AC
  Filled 2016-12-28: qty 5

## 2016-12-28 MED ORDER — LIDOCAINE HCL (PF) 1 % IJ SOLN
INTRAMUSCULAR | Status: AC
  Filled 2016-12-28: qty 60

## 2016-12-28 MED ORDER — HEPARIN (PORCINE) IN NACL 2-0.9 UNIT/ML-% IJ SOLN
INTRAMUSCULAR | Status: AC | PRN
  Administered 2016-12-28: 500 mL

## 2016-12-28 MED ORDER — CHLORHEXIDINE GLUCONATE 4 % EX LIQD
CUTANEOUS | Status: AC
  Filled 2016-12-28: qty 15

## 2016-12-28 MED ORDER — LIDOCAINE HCL (PF) 1 % IJ SOLN
INTRAMUSCULAR | Status: DC | PRN
Start: 2016-12-28 — End: 2016-12-28
  Administered 2016-12-28: 40 mL

## 2016-12-28 MED ORDER — ACETAMINOPHEN 325 MG PO TABS
325.0000 mg | ORAL_TABLET | ORAL | Status: DC | PRN
  Administered 2016-12-29: 01:00:00 650 mg via ORAL
  Filled 2016-12-28: qty 2

## 2016-12-28 MED ORDER — MIDAZOLAM HCL 5 MG/5ML IJ SOLN
INTRAMUSCULAR | Status: DC | PRN
Start: 2016-12-28 — End: 2016-12-28
  Administered 2016-12-28 (×3): 1 mg via INTRAVENOUS

## 2016-12-28 SURGICAL SUPPLY — 8 items
CABLE SURGICAL S-101-97-12 (CABLE) ×2 IMPLANT
IPG PACE AZUR XT DR MRI W1DR01 (Pacemaker) ×1 IMPLANT
LEAD CAPSURE NOVUS 5076-52CM (Lead) ×2 IMPLANT
LEAD CAPSURE NOVUS 5076-58CM (Lead) ×2 IMPLANT
PACE AZURE XT DR MRI W1DR01 (Pacemaker) ×2 IMPLANT
PAD DEFIB LIFELINK (PAD) ×2 IMPLANT
SHEATH CLASSIC 7F (SHEATH) ×4 IMPLANT
TRAY PACEMAKER INSERTION (PACKS) ×2 IMPLANT

## 2016-12-28 NOTE — Progress Notes (Signed)
Subjective:  Feels well, no specific symptoms. Wife and daughter present at the bedside.  Objective:  Vital Signs in the last 24 hours: Temp:  [97.7 F (36.5 C)-98.2 F (36.8 C)] 97.7 F (36.5 C) (05/17 1130) Pulse Rate:  [40-52] 40 (05/17 1130) Resp:  [13-25] 22 (05/17 1130) BP: (112-166)/(61-89) 115/82 (05/17 1130) SpO2:  [95 %-100 %] 100 % (05/17 1420) Weight:  [83.5 kg (184 lb)] 83.5 kg (184 lb) (05/16 1825)  Intake/Output from previous day: 05/16 0701 - 05/17 0700 In: 240 [P.O.:240] Out: -   Physical Exam:   General appearance: alert, cooperative, appears stated age and no distress Eyes: negative findings: lids and lashes normal Neck: no adenopathy, no carotid bruit, no JVD, supple, symmetrical, trachea midline and thyroid not enlarged, symmetric, no tenderness/mass/nodules Neck: JVP - normal, carotids 2+= without bruits Resp: clear to auscultation bilaterally Chest wall: no tenderness Cardio: regular rate and rhythm, S1, S2 normal, no murmur, click, rub or gallop GI: soft, non-tender; bowel sounds normal; no masses,  no organomegaly Extremities: extremities normal, atraumatic, no cyanosis or edema    Lab Results: BMP  Recent Labs  12/27/16 1400  NA 141  K 4.1  CL 108  CO2 26  GLUCOSE 95  BUN 16  CREATININE 1.03  CALCIUM 9.2  GFRNONAA >60  GFRAA >60    CBC  Recent Labs Lab 12/27/16 1400  WBC 7.2  RBC 4.92  HGB 15.1  HCT 45.7  PLT 151  MCV 92.9  MCH 30.7  MCHC 33.0  RDW 12.6    HEMOGLOBIN A1C No results found for: HGBA1C, MPG  Cardiac Panel (last 3 results) No results for input(s): CKTOTAL, CKMB, TROPONINI, RELINDX in the last 8760 hours.  BNP (last 3 results) No results for input(s): PROBNP in the last 8760 hours.  TSH  Recent Labs  12/27/16 1852  TSH 2.145    Lipid Panel  No results found for: CHOL, TRIG, HDL, CHOLHDL, VLDL, LDLCALC, LDLDIRECT   Hepatic Function Panel No results for input(s): PROT, ALBUMIN, AST, ALT,  ALKPHOS, BILITOT, BILIDIR, IBILI in the last 8760 hours.  Imaging: Imaging results have been reviewed  Cardiac Studies: EKG 12/27/2016: Marked sinus bradycardia at a rate of 52 bpm. Repeat EKGs show sinus arrest with junctional escape at a rate of 32 bpm, junctional escape as low as 28 bpm.  Telemetry 12/28/2016: Sinus rhythm, sinus bradycardia, paroxysmal episodes of sinus arrest with junctional escape rhythm.  ECHO: 12/28/2016: Normal LV systolic function, mild MR, mild to moderate TR, no pulmonary hypertension.   Assessment/Plan:  1. Sick sinus syndrome, sinus arrest 2. Near syncope, dizziness 3. Hypertension  Recommendation: Patient and the family are now ready for this evening day permanent pacemaker. They have done their own research and agree with my assessment. Dr. Curt Bears has gracefully accepted the patient for the procedure. I will continue to follow the patient. All questions answered.  Adrian Prows, M.D. 12/28/2016, 2:36 PM [facility] Pager: [phone] Office: [phone] If no answer: [phone]

## 2016-12-28 NOTE — Care Management Note (Signed)
Case Management Note  Patient Details  Name: Charles Ellison. MRN: [id number] Date of Birth: 01/08/1951  Subjective/Objective:     From home with wife, presents with symptomatic bradycardia, he is for PPM implantation.                Action/Plan: NCM will follow for dc needs.   Expected Discharge Date:                  Expected Discharge Plan:  Home/Self Care  In-House Referral:     Discharge planning Services  CM Consult  Post Acute Care Choice:    Choice offered to:     DME Arranged:    DME Agency:     HH Arranged:    HH Agency:     Status of Service:  In process, will continue to follow  If discussed at Long Length of Stay Meetings, dates discussed:    Additional Comments:  Zenon Mayo, RN 12/28/2016, 11:32 AM

## 2016-12-28 NOTE — Consult Note (Signed)
ELECTROPHYSIOLOGY CONSULT NOTE    Patient ID: Charles Ellison. MRN: [id number], DOB/AGE: 04-09-1951 66 y.o.  Admit date: 12/27/2016 Date of Consult: 12/28/2016  Primary Physician: Charles Huddle, MD Primary Cardiologist: Dr. Einar Ellison   HPI: Charles Ellison. is a 66 y.o. male who is being seen today for the evaluation of symptomatic bradycardia at the request of [place].  PMHx is noted for HTN only.  The patient in hindsight reports for a bout a month feeling lightheaded particularly upon standing, didn't think too much of it, in the last couple weeks not much energy, and last week started checking his BP thinking it was the cause, he did notice his BP unusually low, but more so, his HR was in the 40's, this persisted and he saw his PMD who had him stop his lisinopril noting his BP low.  The patient is very active, walks daily 4-5 miles at a brisk pace, often outdoors [place] hike as well, all without any kind of CP or SOB.  He continued have weak spells but no dizziness, his BP and HR continued to be low and was noted by his PMD to have junctional rhythm and referred to the ER yesterday.  He has not had syncope.  This morning at rest he feels well.   LABS K+ 4.1 BUN/Creat 16/1.03 Mag 2.1 WBC 7.2 H/H 15/45 Plts 151 TSH 2.145  Home meds reviewed: No nodal blocking/rate limiting agents  Past Medical History:  Diagnosis Date  . Essential hypertension   . Sinus arrest 12/27/2016   Charles Ellison 12/27/2016     Surgical History:  Past Surgical History:  Procedure Laterality Date  . SHOULDER ARTHROSCOPY Left ~ 2005   "for calcium deposits; impinged shoulder"     Prescriptions Prior to Admission  Medication Sig Dispense Refill Last Dose  . aspirin EC 81 MG tablet Take 81 mg by mouth daily.   Past Week at Unknown time  . Probiotic Product (PROBIOTIC PO) Take 1 tablet by mouth daily.   12/26/2016 at Unknown time  . ciprofloxacin (CILOXAN) 0.3 % ophthalmic solution Place 1 drop into the  right eye every 4 (four) hours. x 5 days (Patient not taking: Reported on 12/27/2016) 5 mL 0 Completed Course at Unknown time    Inpatient Medications:  . gentamicin irrigation  80 mg Irrigation On Call    Allergies: No Known Allergies  Social History   Social History  . Marital status: Married    Spouse name: N/A  . Number of children: N/A  . Years of education: N/A   Occupational History  . Not on file.   Social History Main Topics  . Smoking status: Never Smoker  . Smokeless tobacco: Never Used  . Alcohol use 0.0 oz/week     Comment: 12/27/2016 "might have 1 drink/month"  . Drug use: No  . Sexual activity: Yes   Other Topics Concern  . Not on file   Social History Narrative  . No narrative on file     Family History  Problem Relation Age of Onset  . Heart disease Mother        died at 50 unclear heart condition  . Valvular heart disease Father        died at 23, 2/2 traumatic SDH     Review of Systems: All other systems reviewed and are otherwise negative except as noted above.  Physical Exam: Vitals:   12/27/16 1938 12/27/16 2135 12/27/16 2320 12/28/16 0530  BP: (!) 166/89 131/72  131/61 132/72  Pulse: (!) 42 (!) 47 (!) 43 (!) 47  Resp: 15 (!) 21 (!) 21 17  Temp: 97.7 F (36.5 C)   98.2 F (36.8 C)  TempSrc: Oral   Oral  SpO2: 98% 95% 95% 95%  Weight:      Height:        GEN- The patient is well appearing, alert and oriented x 3 today.   HEENT: normocephalic, atraumatic; sclera clear, conjunctiva pink; hearing intact; oropharynx clear; neck supple, no JVP Lymph- no cervical lymphadenopathy Lungs- CTA b/l, normal work of breathing.  No wheezes, rales, rhonchi Heart- RRR, bradycardic, no murmurs, rubs or gallops, PMI not laterally displaced GI- soft, non-tender, non-distended Extremities- no clubbing, cyanosis, or edema MS- no significant deformity or atrophy Skin- warm and dry, no rash or lesion Psych- euthymic mood, full affect Neuro- no gross  deficits observed  Labs:   Lab Results  Component Value Date   WBC 7.2 12/27/2016   HGB 15.1 12/27/2016   HCT 45.7 12/27/2016   MCV 92.9 12/27/2016   PLT 151 12/27/2016     Recent Labs Lab 12/27/16 1400  NA 141  K 4.1  CL 108  CO2 26  BUN 16  CREATININE 1.03  CALCIUM 9.2  GLUCOSE 95      Radiology/Studies: No results found.  Reviewed by myself: EKG: SB w/junctional beats as well, PR 176ms, QRS 85ms, QTc 333ms TELEMETRY: SB 40's with junctional rhythm 35-40's  Echo is ordered, pending    Assessment and Plan:   1. Symptomatic bradycardia     Sinus node dysfunction     No reversible causes are identified     Echo is pending    I have discussed PPM implantation with the patient and his wife, risks/benefits of the procedure and he is agreeable to proceed.  Dr. Curt Ellison to see  2. HTN     No meds for now,looks OK    Signed, Charles Standard, PA-C 12/28/2016 7:35 AM   I have seen and examined this patient with Charles Ellison.  Agree with above, note added to reflect my findings.  On exam, bradycardic, no murmurs, lungs clear. Presented to the hospital with fatigue, found to have sinus bradycardia with episodes of junctional rhythm. No medications to cause bradycardia. Discussed pacemaker. Risks and benefits discussed. Risks include but not limited to bleeding, infection, tamponade, pneumothorax. The patient understands the risks and has agreed to the procedure..    Charles M. Camnitz MD 12/28/2016 12:54 PM

## 2016-12-28 NOTE — Progress Notes (Signed)
  Echocardiogram 2D Echocardiogram has been performed.  Darlina Sicilian M 12/28/2016, 8:59 AM

## 2016-12-28 NOTE — Progress Notes (Signed)
0545 Consent form signed and on front of chart. Pt NPO since MN. Chest shaved and prepped as ordered. Hibiclens bath administered x2. 1000 cc's NSS hung and infusing at 50 cc/hr through 2nd IV site. Pt slept comfortably throughout the night. VSS HR 32-44 SB/JR.

## 2016-12-29 ENCOUNTER — Encounter (HOSPITAL_COMMUNITY): Payer: Self-pay | Admitting: Cardiology

## 2016-12-29 ENCOUNTER — Inpatient Hospital Stay (HOSPITAL_COMMUNITY): Payer: BLUE CROSS/BLUE SHIELD

## 2016-12-29 DIAGNOSIS — Z950 Presence of cardiac pacemaker: Secondary | ICD-10-CM | POA: Insufficient documentation

## 2016-12-29 DIAGNOSIS — Z45018 Encounter for adjustment and management of other part of cardiac pacemaker: Secondary | ICD-10-CM | POA: Insufficient documentation

## 2016-12-29 DIAGNOSIS — I495 Sick sinus syndrome: Secondary | ICD-10-CM | POA: Insufficient documentation

## 2016-12-29 MED FILL — Cefazolin Sodium-Dextrose IV Solution 2 GM/100ML-4%: INTRAVENOUS | Qty: 100 | Status: AC

## 2016-12-29 MED FILL — Gentamicin Sulfate Inj 40 MG/ML: INTRAMUSCULAR | Qty: 2 | Status: AC

## 2016-12-29 MED FILL — Sodium Chloride Irrigation Soln 0.9%: Qty: 500 | Status: AC

## 2016-12-29 NOTE — Discharge Summary (Signed)
Physician Discharge Summary  Patient ID: Charles Ellison. MRN: [id number] DOB/AGE: 1951-05-17 66 y.o.  Admit date: 12/27/2016 Discharge date: 12/29/2016  Primary Discharge Diagnosis 1.  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 History of symptomatic bradycardia                                                          3. Hypertension  Significant Diagnostic Studies:  Echo 12/28/2016: - Left ventricle: The cavity size was normal. Systolic function was   normal. The estimated ejection fraction was in the range of 55%   to 60%. Wall motion was normal; there were no regional wall   motion abnormalities. Left ventricular diastolic function   parameters were normal. - Aortic valve: There was trivial regurgitation. - Mitral valve: There was mild regurgitation. - Pulmonary arteries: The main pulmonary artery was normal-sized.   PA peak pressure: 29 mm Hg (S).  Hospital Course: Patient presented with near syncope and dizziness and was found to be in junctional rhythm along with bradycardia in PCP office. Underwent pacemaker implantation with Dr. Curt Bears on 12/28/2016.   Patient has recovered well overnight from the procedure. No complaints today.    Recommendations on discharge: Patient will need follow up with wound care after discharge as well as follow up with pacemaker check in 1 month. Arrangements will be arranged prior to discharge. Post-pacemaker limitation instructions will be provided.   Discharge Exam: Blood pressure (!) 142/73, pulse 60, temperature 97.8 F (36.6 C), temperature source Oral, resp. rate 15, height 5\' 10"  (1.778 m), weight 83.5 kg (184 lb), SpO2 96 %.     General appearance: alert, cooperative, appears stated age and no distress Resp: clear to auscultation bilaterally Chest wall: no tenderness Cardio: regular rate and rhythm, S1, S2 normal, no murmur,  click, rub or gallop Extremities: extremities normal, atraumatic, no cyanosis or edema Pulses: 2+ and symmetric Skin: Skin color, texture, turgor normal. No rashes or lesions Incision/Wound: Left chest pacemaker scar noted. No pocket hematoma. No drainage. Dsg d/i Labs:   Lab Results  Component Value Date   WBC 7.2 12/27/2016   HGB 15.1 12/27/2016   HCT 45.7 12/27/2016   MCV 92.9 12/27/2016   PLT 151 12/27/2016    Recent Labs Lab 12/27/16 1400  NA 141  K 4.1  CL 108  CO2 26  BUN 16  CREATININE 1.03  CALCIUM 9.2  GLUCOSE 95    Lipid Panel  No results found for: CHOL, TRIG, HDL, CHOLHDL, VLDL, LDLCALC  BNP (last 3 results) No results for input(s): BNP in the last 8760 hours.  HEMOGLOBIN A1C No results found for: HGBA1C, MPG  Cardiac Panel (last 3 results) No results for input(s): CKTOTAL, CKMB, TROPONINI, RELINDX in the last 8760 hours.  No results found for: CKTOTAL, CKMB, CKMBINDEX, TROPONINI   TSH  Recent Labs  12/27/16 1852  TSH 2.145    EKG 12/29/2016: Atrial paced rhythm at 60 bpm, inferior infarct old  Telemetry 12/29/2016: AV paced rhythm at 60 bpm    Radiology: Dg Chest 2 View  Result Date: 12/29/2016 CLINICAL DATA:  Status post pacemaker placement. EXAM: CHEST  2 VIEW COMPARISON:  None. FINDINGS: The heart size and mediastinal contours  are within normal limits. Both lungs are clear. No pneumothorax or pleural effusion is noted. Left-sided pacemaker is noted with leads in grossly good position. The visualized skeletal structures are unremarkable. IMPRESSION: No active cardiopulmonary disease. Left-sided pacemaker is noted with leads in grossly good position. Electronically Signed   By: Marijo Conception, M.D.   On: 12/29/2016 07:23      FOLLOW UP PLANS AND APPOINTMENTS      Follow-up Information    [facility] Follow up on 01/11/2017.   Specialty:  Cardiology Why:  at 12noon for wound check  Contact information: [address] [place]       Constance Haw, MD Follow up on 04/03/2017.   Specialty:  Cardiology Why:  at 8:45AM Contact information: [address] [place] [address] [phone]            Miquel Dunn, [place] 12/29/2016, 7:44 AM  Pager: [phone] Office: [phone] If no answer: [phone]

## 2016-12-29 NOTE — Discharge Instructions (Signed)
° ° °  Supplemental Discharge Instructions for  Pacemaker/Defibrillator Patients  Activity No heavy lifting or vigorous activity with your left/right arm for 6 to 8 weeks.  Do not raise your left/right arm above your head for one week.  Gradually raise your affected arm as drawn below.           __         01/01/17                     01/02/17                    01/03/17                 01/04/17  NO DRIVING for  1 week   ; you may begin driving on   4/96/75  .  WOUND CARE - Keep the wound area clean and dry.  Do not get this area wet for one week. No showers for one week; you may shower on  01/04/17   . - The tape/steri-strips on your wound will fall off; do not pull them off.  No bandage is needed on the site.  DO  NOT apply any creams, oils, or ointments to the wound area. - If you notice any drainage or discharge from the wound, any swelling or bruising at the site, or you develop a fever > 101? F after you are discharged home, call the office at once.  Special Instructions - You are still able to use cellular telephones; use the ear opposite the side where you have your pacemaker/defibrillator.  Avoid carrying your cellular phone near your device. - When traveling through airports, show security personnel your identification card to avoid being screened in the metal detectors.  Ask the security personnel to use the hand wand. - Avoid arc welding equipment, TENS units (transcutaneous nerve stimulators).  Call the office for questions about other devices. - Avoid electrical appliances that are in poor condition or are not properly grounded. - Microwave ovens are safe to be near or to operate.

## 2017-01-04 ENCOUNTER — Encounter: Payer: Self-pay | Admitting: Cardiology

## 2017-01-04 DIAGNOSIS — Z95810 Presence of automatic (implantable) cardiac defibrillator: Secondary | ICD-10-CM | POA: Diagnosis not present

## 2017-01-04 NOTE — Telephone Encounter (Signed)
lmtcb

## 2017-01-04 NOTE — Telephone Encounter (Signed)
Pt returns my call.  He is going to send manual transmission.  He understands office will only call him back if there is a concern or need to discuss transmission findings. The reported dizziness is not new as he experienced it prior to PPM placement.  He does state that it is improved from before PPM, but still slightly lingering.  He is going to continue to monitor dizziness.

## 2017-01-11 ENCOUNTER — Ambulatory Visit (INDEPENDENT_AMBULATORY_CARE_PROVIDER_SITE_OTHER): Payer: BLUE CROSS/BLUE SHIELD | Admitting: *Deleted

## 2017-01-11 DIAGNOSIS — Z950 Presence of cardiac pacemaker: Secondary | ICD-10-CM

## 2017-01-11 DIAGNOSIS — I455 Other specified heart block: Secondary | ICD-10-CM

## 2017-01-11 LAB — CUP PACEART INCLINIC DEVICE CHECK
Brady Statistic AP VS Percent: 96.7 %
Brady Statistic AS VP Percent: 0.15 %
Brady Statistic AS VS Percent: 2.45 %
Date Time Interrogation Session: 20180531134728
Implantable Lead Implant Date: 20180517
Implantable Lead Location: 753859
Implantable Lead Location: 753860
Implantable Lead Model: 5076
Lead Channel Impedance Value: 361 Ohm
Lead Channel Pacing Threshold Amplitude: 1.25 V
Lead Channel Pacing Threshold Pulse Width: 0.4 ms
Lead Channel Setting Pacing Amplitude: 3.5 V
Lead Channel Setting Pacing Amplitude: 3.5 V
Lead Channel Setting Pacing Pulse Width: 0.4 ms
MDC IDC LEAD IMPLANT DT: 20180517
MDC IDC MSMT BATTERY REMAINING LONGEVITY: 144 mo
MDC IDC MSMT BATTERY VOLTAGE: 3.17 V
MDC IDC MSMT LEADCHNL RA IMPEDANCE VALUE: 361 Ohm
MDC IDC MSMT LEADCHNL RA IMPEDANCE VALUE: 494 Ohm
MDC IDC MSMT LEADCHNL RA PACING THRESHOLD AMPLITUDE: 0.75 V
MDC IDC MSMT LEADCHNL RA PACING THRESHOLD PULSEWIDTH: 0.4 ms
MDC IDC MSMT LEADCHNL RV IMPEDANCE VALUE: 456 Ohm
MDC IDC MSMT LEADCHNL RV SENSING INTR AMPL: 24.125 mV
MDC IDC PG IMPLANT DT: 20180517
MDC IDC SET LEADCHNL RV SENSING SENSITIVITY: 2 mV
MDC IDC STAT BRADY AP VP PERCENT: 0.71 %
MDC IDC STAT BRADY RA PERCENT PACED: 97.53 %
MDC IDC STAT BRADY RV PERCENT PACED: 0.86 %

## 2017-01-11 NOTE — Progress Notes (Signed)
Wound check appointment. Steri-strips removed. Wound without redness or edema. Incision edges approximated, wound well healed. Normal device function. Thresholds, sensing, and impedances consistent with implant measurements. Device programmed at 3.5V with auto capture programmed on for extra safety margin until 3 month visit. Reprogrammed RA/RV max pacing impedance to 2000ohms. Histogram distribution appropriate for patient and level of activity. No mode switches or high ventricular rates noted. Patient educated about wound care, arm mobility, lifting restrictions. ROV with WC on 04/03/17.

## 2017-01-29 DIAGNOSIS — H2513 Age-related nuclear cataract, bilateral: Secondary | ICD-10-CM | POA: Diagnosis not present

## 2017-01-29 DIAGNOSIS — H25043 Posterior subcapsular polar age-related cataract, bilateral: Secondary | ICD-10-CM | POA: Diagnosis not present

## 2017-02-21 DIAGNOSIS — H25042 Posterior subcapsular polar age-related cataract, left eye: Secondary | ICD-10-CM | POA: Diagnosis not present

## 2017-02-21 DIAGNOSIS — H25041 Posterior subcapsular polar age-related cataract, right eye: Secondary | ICD-10-CM | POA: Diagnosis not present

## 2017-02-21 DIAGNOSIS — H2512 Age-related nuclear cataract, left eye: Secondary | ICD-10-CM | POA: Diagnosis not present

## 2017-02-21 DIAGNOSIS — H2511 Age-related nuclear cataract, right eye: Secondary | ICD-10-CM | POA: Diagnosis not present

## 2017-02-28 DIAGNOSIS — H25042 Posterior subcapsular polar age-related cataract, left eye: Secondary | ICD-10-CM | POA: Diagnosis not present

## 2017-02-28 DIAGNOSIS — H2512 Age-related nuclear cataract, left eye: Secondary | ICD-10-CM | POA: Diagnosis not present

## 2017-03-11 DIAGNOSIS — Z45010 Encounter for checking and testing of cardiac pacemaker pulse generator [battery]: Secondary | ICD-10-CM | POA: Diagnosis not present

## 2017-03-11 DIAGNOSIS — Z950 Presence of cardiac pacemaker: Secondary | ICD-10-CM | POA: Diagnosis not present

## 2017-03-12 DIAGNOSIS — Z0189 Encounter for other specified special examinations: Secondary | ICD-10-CM | POA: Diagnosis not present

## 2017-03-12 DIAGNOSIS — Z950 Presence of cardiac pacemaker: Secondary | ICD-10-CM | POA: Diagnosis not present

## 2017-03-12 DIAGNOSIS — I495 Sick sinus syndrome: Secondary | ICD-10-CM | POA: Diagnosis not present

## 2017-03-12 DIAGNOSIS — I340 Nonrheumatic mitral (valve) insufficiency: Secondary | ICD-10-CM | POA: Diagnosis not present

## 2017-04-03 ENCOUNTER — Encounter: Payer: BLUE CROSS/BLUE SHIELD | Admitting: Cardiology

## 2017-04-09 DIAGNOSIS — Z950 Presence of cardiac pacemaker: Secondary | ICD-10-CM | POA: Diagnosis not present

## 2017-04-09 DIAGNOSIS — Z0189 Encounter for other specified special examinations: Secondary | ICD-10-CM | POA: Diagnosis not present

## 2017-04-09 DIAGNOSIS — I495 Sick sinus syndrome: Secondary | ICD-10-CM | POA: Diagnosis not present

## 2017-04-09 DIAGNOSIS — I340 Nonrheumatic mitral (valve) insufficiency: Secondary | ICD-10-CM | POA: Diagnosis not present

## 2017-05-07 DIAGNOSIS — I10 Essential (primary) hypertension: Secondary | ICD-10-CM | POA: Diagnosis not present

## 2017-05-07 DIAGNOSIS — Z136 Encounter for screening for cardiovascular disorders: Secondary | ICD-10-CM | POA: Diagnosis not present

## 2017-05-07 DIAGNOSIS — I472 Ventricular tachycardia: Secondary | ICD-10-CM | POA: Diagnosis not present

## 2017-05-30 DIAGNOSIS — L821 Other seborrheic keratosis: Secondary | ICD-10-CM | POA: Diagnosis not present

## 2017-05-30 DIAGNOSIS — L304 Erythema intertrigo: Secondary | ICD-10-CM | POA: Diagnosis not present

## 2017-05-30 DIAGNOSIS — D229 Melanocytic nevi, unspecified: Secondary | ICD-10-CM | POA: Diagnosis not present

## 2017-06-21 DIAGNOSIS — L600 Ingrowing nail: Secondary | ICD-10-CM | POA: Diagnosis not present

## 2017-07-04 DIAGNOSIS — R202 Paresthesia of skin: Secondary | ICD-10-CM | POA: Diagnosis not present

## 2017-07-04 DIAGNOSIS — L304 Erythema intertrigo: Secondary | ICD-10-CM | POA: Diagnosis not present

## 2017-07-09 DIAGNOSIS — Z01818 Encounter for other preprocedural examination: Secondary | ICD-10-CM | POA: Diagnosis not present

## 2017-07-09 DIAGNOSIS — Z1211 Encounter for screening for malignant neoplasm of colon: Secondary | ICD-10-CM | POA: Diagnosis not present

## 2017-07-11 DIAGNOSIS — H9041 Sensorineural hearing loss, unilateral, right ear, with unrestricted hearing on the contralateral side: Secondary | ICD-10-CM | POA: Diagnosis not present

## 2017-07-11 DIAGNOSIS — H903 Sensorineural hearing loss, bilateral: Secondary | ICD-10-CM | POA: Diagnosis not present

## 2017-07-12 DIAGNOSIS — H04122 Dry eye syndrome of left lacrimal gland: Secondary | ICD-10-CM | POA: Diagnosis not present

## 2017-07-12 DIAGNOSIS — Z961 Presence of intraocular lens: Secondary | ICD-10-CM | POA: Diagnosis not present

## 2017-07-16 DIAGNOSIS — Z45018 Encounter for adjustment and management of other part of cardiac pacemaker: Secondary | ICD-10-CM | POA: Diagnosis not present

## 2017-07-16 DIAGNOSIS — I495 Sick sinus syndrome: Secondary | ICD-10-CM | POA: Diagnosis not present

## 2017-07-16 DIAGNOSIS — Z950 Presence of cardiac pacemaker: Secondary | ICD-10-CM | POA: Diagnosis not present

## 2017-08-03 DIAGNOSIS — D175 Benign lipomatous neoplasm of intra-abdominal organs: Secondary | ICD-10-CM | POA: Diagnosis not present

## 2017-08-03 DIAGNOSIS — D126 Benign neoplasm of colon, unspecified: Secondary | ICD-10-CM | POA: Diagnosis not present

## 2017-08-03 DIAGNOSIS — Z1211 Encounter for screening for malignant neoplasm of colon: Secondary | ICD-10-CM | POA: Diagnosis not present

## 2017-08-10 DIAGNOSIS — Z1211 Encounter for screening for malignant neoplasm of colon: Secondary | ICD-10-CM | POA: Diagnosis not present

## 2017-08-10 DIAGNOSIS — D126 Benign neoplasm of colon, unspecified: Secondary | ICD-10-CM | POA: Diagnosis not present

## 2017-09-16 IMAGING — DX DG CHEST 2V
2 series · 2 of 2 positions shown · non-contrast
Comparison: None.

CLINICAL DATA: Status post pacemaker placement.

EXAM:
CHEST  2 VIEW

[chest pa]
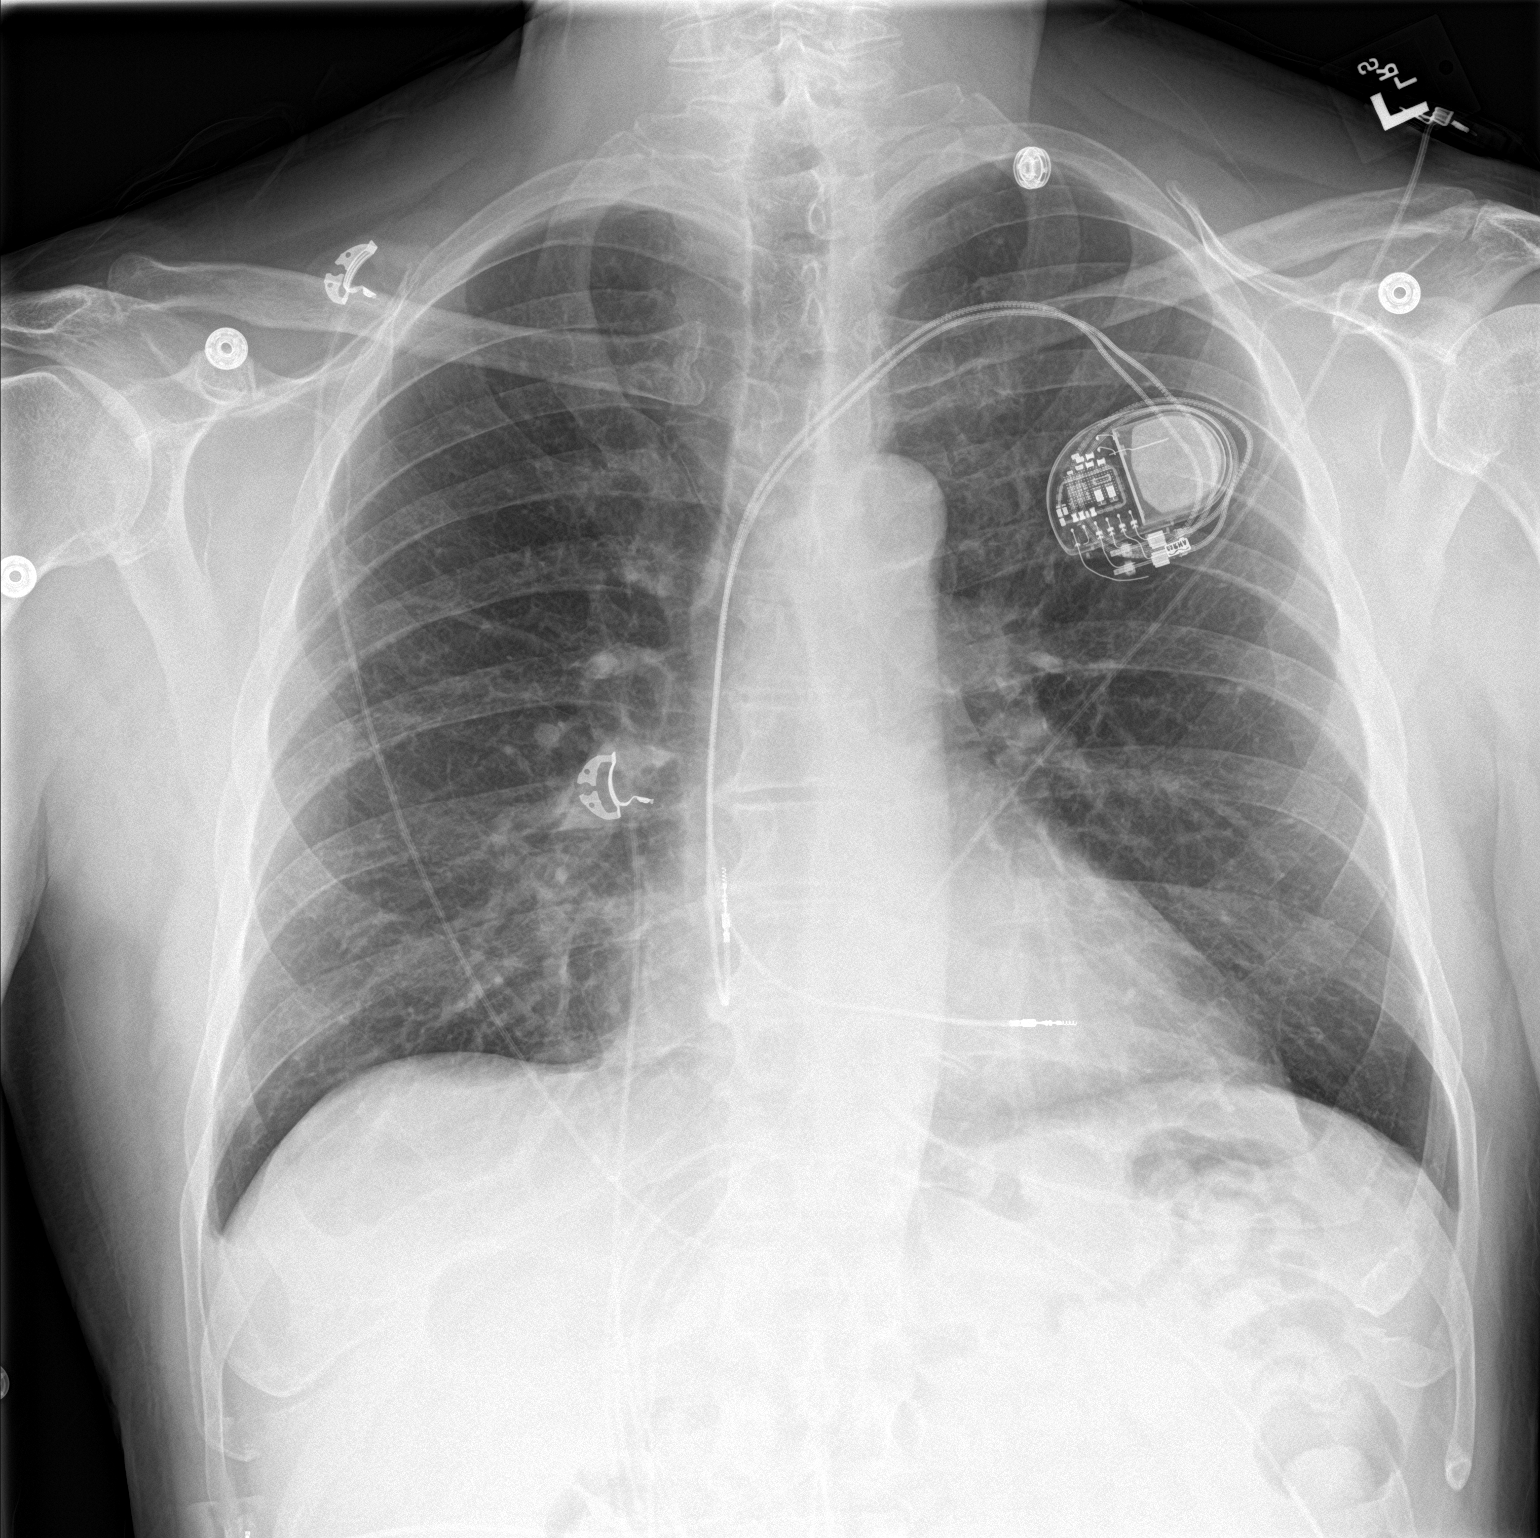

[chest lat]
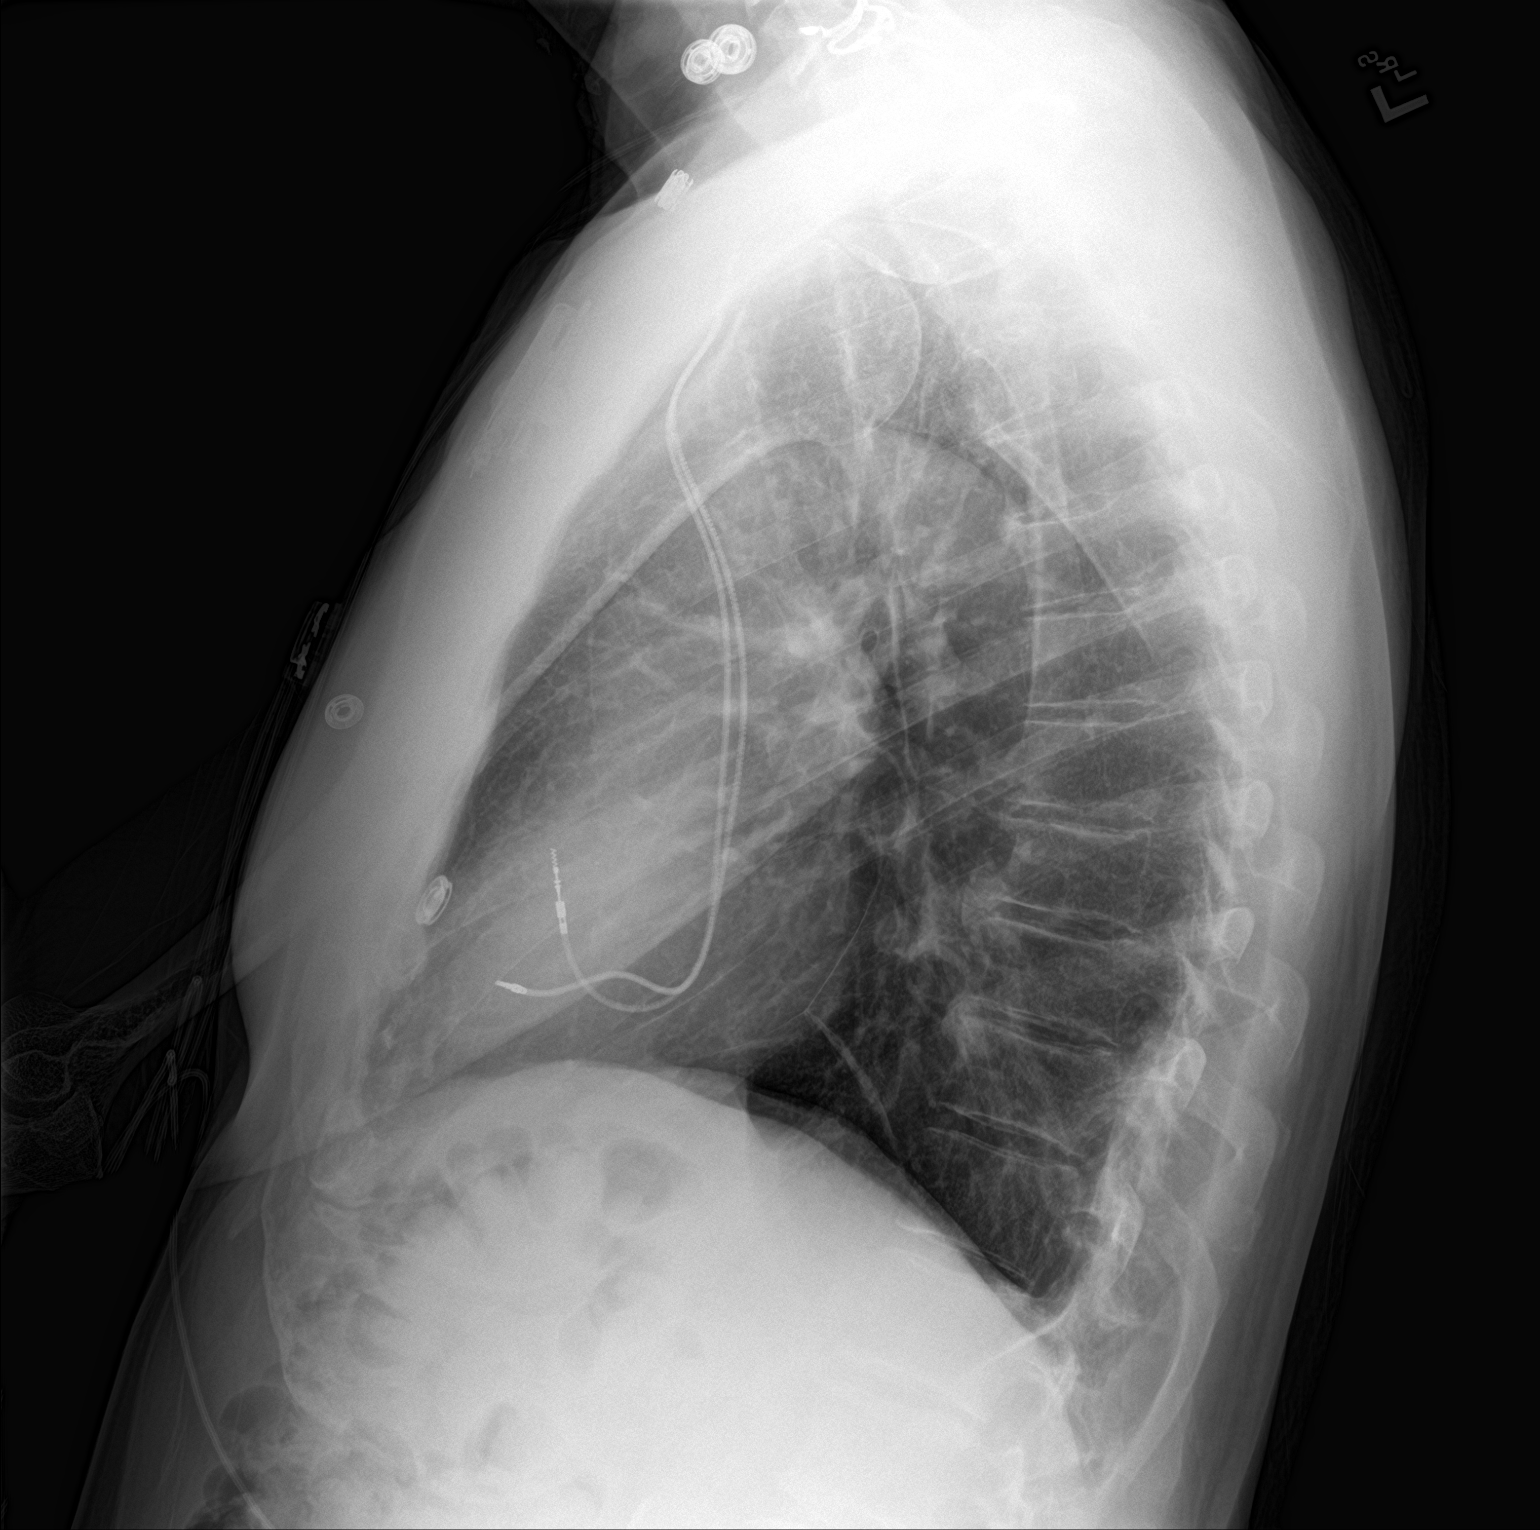

[2 of 2 positions shown; findings below may reference images not displayed]

FINDINGS: The heart size and mediastinal contours are within normal limits.
Both lungs are clear. No pneumothorax or pleural effusion is noted.
Left-sided pacemaker is noted with leads in grossly good position.
The visualized skeletal structures are unremarkable.
IMPRESSION: No active cardiopulmonary disease. Left-sided pacemaker is noted
with leads in grossly good position.

## 2017-10-12 DIAGNOSIS — I10 Essential (primary) hypertension: Secondary | ICD-10-CM | POA: Diagnosis not present

## 2017-10-12 DIAGNOSIS — Z950 Presence of cardiac pacemaker: Secondary | ICD-10-CM | POA: Diagnosis not present

## 2017-10-12 DIAGNOSIS — I340 Nonrheumatic mitral (valve) insufficiency: Secondary | ICD-10-CM | POA: Diagnosis not present

## 2017-10-15 DIAGNOSIS — Z45018 Encounter for adjustment and management of other part of cardiac pacemaker: Secondary | ICD-10-CM | POA: Diagnosis not present

## 2017-10-15 DIAGNOSIS — Z950 Presence of cardiac pacemaker: Secondary | ICD-10-CM | POA: Diagnosis not present

## 2017-10-15 DIAGNOSIS — I498 Other specified cardiac arrhythmias: Secondary | ICD-10-CM | POA: Diagnosis not present

## 2017-12-03 DIAGNOSIS — L03031 Cellulitis of right toe: Secondary | ICD-10-CM | POA: Diagnosis not present

## 2017-12-03 DIAGNOSIS — E782 Mixed hyperlipidemia: Secondary | ICD-10-CM | POA: Diagnosis not present

## 2017-12-03 DIAGNOSIS — Z125 Encounter for screening for malignant neoplasm of prostate: Secondary | ICD-10-CM | POA: Diagnosis not present

## 2017-12-03 DIAGNOSIS — R001 Bradycardia, unspecified: Secondary | ICD-10-CM | POA: Diagnosis not present

## 2017-12-03 DIAGNOSIS — I10 Essential (primary) hypertension: Secondary | ICD-10-CM | POA: Diagnosis not present

## 2017-12-03 DIAGNOSIS — Z79899 Other long term (current) drug therapy: Secondary | ICD-10-CM | POA: Diagnosis not present

## 2018-01-14 DIAGNOSIS — Z950 Presence of cardiac pacemaker: Secondary | ICD-10-CM | POA: Diagnosis not present

## 2018-01-14 DIAGNOSIS — Z45018 Encounter for adjustment and management of other part of cardiac pacemaker: Secondary | ICD-10-CM | POA: Diagnosis not present

## 2018-02-28 ENCOUNTER — Encounter: Payer: Self-pay | Admitting: Cardiology

## 2018-04-15 DIAGNOSIS — Z45018 Encounter for adjustment and management of other part of cardiac pacemaker: Secondary | ICD-10-CM | POA: Diagnosis not present

## 2018-04-15 DIAGNOSIS — Z950 Presence of cardiac pacemaker: Secondary | ICD-10-CM | POA: Diagnosis not present

## 2018-07-15 DIAGNOSIS — H524 Presbyopia: Secondary | ICD-10-CM | POA: Diagnosis not present

## 2018-07-15 DIAGNOSIS — Z961 Presence of intraocular lens: Secondary | ICD-10-CM | POA: Diagnosis not present

## 2018-10-14 DIAGNOSIS — Z45018 Encounter for adjustment and management of other part of cardiac pacemaker: Secondary | ICD-10-CM | POA: Diagnosis not present

## 2018-10-14 DIAGNOSIS — Z950 Presence of cardiac pacemaker: Secondary | ICD-10-CM | POA: Diagnosis not present

## 2018-10-14 DIAGNOSIS — I495 Sick sinus syndrome: Secondary | ICD-10-CM | POA: Diagnosis not present

## 2018-10-19 ENCOUNTER — Telehealth: Payer: Self-pay | Admitting: Cardiology

## 2018-10-19 ENCOUNTER — Encounter: Payer: Self-pay | Admitting: Cardiology

## 2018-10-19 DIAGNOSIS — I10 Essential (primary) hypertension: Secondary | ICD-10-CM | POA: Insufficient documentation

## 2018-10-28 NOTE — Progress Notes (Deleted)
Patient is here for follow up visit.  Subjective:   @Patient  ID: [name], male    DOB: 1951/07/03, 68 y.o.   MRN: [id number]  No chief complaint on file.   *** HPI  68 y.o.***male with ***  *** Past Medical History:  Diagnosis Date  . Essential hypertension   . Sinus arrest 12/27/2016   [name] 12/27/2016    *** Past Surgical History:  Procedure Laterality Date  . PACEMAKER IMPLANT N/A 12/28/2016   Procedure: Pacemaker Implant;  Surgeon: Constance Haw, MD;  Location: [place] [facility];  Service: Cardiovascular;  Laterality: N/A;  . SHOULDER ARTHROSCOPY Left ~ 2005   "for calcium deposits; impinged shoulder"    *** Social History   Socioeconomic History  . Marital status: Married    Spouse name: Not on file  . Number of children: Not on file  . Years of education: Not on file  . Highest education level: Not on file  Occupational History  . Not on file  Social Needs  . Financial resource strain: Not on file  . Food insecurity:    Worry: Not on file    Inability: Not on file  . Transportation needs:    Medical: Not on file    Non-medical: Not on file  Tobacco Use  . Smoking status: Never Smoker  . Smokeless tobacco: Never Used  Substance and Sexual Activity  . Alcohol use: Yes    Alcohol/week: 0.0 standard drinks    Comment: 12/27/2016 "might have 1 drink/month"  . Drug use: No  . Sexual activity: Yes  Lifestyle  . Physical activity:    Days per week: Not on file    Minutes per session: Not on file  . Stress: Not on file  Relationships  . Social connections:    Talks on phone: Not on file    Gets together: Not on file    Attends religious service: Not on file    Active member of club or organization: Not on file    Attends meetings of clubs or organizations: Not on file    Relationship status: Not on file  . Intimate partner violence:    Fear of current or ex partner: Not on file    Emotionally abused: Not on file   Physically abused: Not on file    Forced sexual activity: Not on file  Other Topics Concern  . Not on file  Social History Narrative  . Not on file    *** Current Outpatient Medications on File Prior to Visit  Medication Sig Dispense Refill  . aspirin EC 81 MG tablet Take 81 mg by mouth daily.    . Probiotic Product (PROBIOTIC PO) Take 1 tablet by mouth daily.     No current facility-administered medications on file prior to visit.     Cardiovascular studies:  ***  *** Recent labs:  ***  Review of Systems  Constitution: Negative for decreased appetite, malaise/fatigue, weight gain and weight loss.  HENT: Negative for congestion.   Eyes: Negative for visual disturbance.  Cardiovascular: Negative for chest pain, dyspnea on exertion, leg swelling, palpitations and syncope.  Respiratory: Negative for shortness of breath.   Endocrine: Negative for cold intolerance.  Hematologic/Lymphatic: Does not bruise/bleed easily.  Skin: Negative for itching and rash.  Musculoskeletal: Negative for myalgias.  Gastrointestinal: Negative for abdominal pain, nausea and vomiting.  Genitourinary: Negative for dysuria.  Neurological: Negative for dizziness and weakness.  Psychiatric/Behavioral: The patient is not  nervous/anxious.   All other systems reviewed and are negative.      Objective:   *** There were no vitals filed for this visit.   Physical Exam  Constitutional: He is oriented to person, place, and time. He appears well-developed and well-nourished. No distress.  HENT:  Head: Normocephalic and atraumatic.  Eyes: Pupils are equal, round, and reactive to light. Conjunctivae are normal.  Neck: No JVD present.  Cardiovascular: Normal rate, regular rhythm and intact distal pulses.  Pulmonary/Chest: Effort normal and breath sounds normal. He has no wheezes. He has no rales.  Abdominal: Soft. Bowel sounds are normal. There is no rebound.  Musculoskeletal:        General: No  edema.  Lymphadenopathy:    He has no cervical adenopathy.  Neurological: He is alert and oriented to person, place, and time. No cranial nerve deficit.  Skin: Skin is warm and dry.  Psychiatric: He has a normal mood and affect.  Nursing note and vitals reviewed.       Assessment & Recommendations:   ***  There are no diagnoses linked to this encounter.   Nigel Mormon, MD [facility]. [place] Pager: [phone] Office: [phone] If no answer Cell [phone]

## 2018-10-30 ENCOUNTER — Ambulatory Visit: Payer: Self-pay | Admitting: Cardiology

## 2018-12-23 ENCOUNTER — Telehealth: Payer: Self-pay

## 2018-12-23 ENCOUNTER — Telehealth: Payer: Self-pay | Admitting: Cardiology

## 2018-12-23 DIAGNOSIS — I10 Essential (primary) hypertension: Secondary | ICD-10-CM

## 2018-12-23 MED ORDER — METOPROLOL SUCCINATE ER 50 MG PO TB24: 50.0000 mg | ORAL_TABLET | Freq: Every day | ORAL | 1 refills | Status: DC

## 2018-12-23 MED ORDER — METOPROLOL SUCCINATE ER 50 MG PO TB24: 50.0000 mg | ORAL_TABLET | Freq: Every day | ORAL | 0 refills | Status: DC

## 2018-12-24 ENCOUNTER — Telehealth: Payer: Self-pay | Admitting: Cardiology

## 2018-12-24 NOTE — Telephone Encounter (Signed)
New message:    Patient calling to get a appt with Dr. Curt Bears. Patient has not been seen since 2018. Please call patient.

## 2018-12-25 NOTE — Telephone Encounter (Signed)
Requested the pt be released in Carelink today.

## 2018-12-25 NOTE — Telephone Encounter (Signed)
Returned pt call.  Pt would like to transfer his care back to [facility]. Pt aware I will forward this to our device clinic to aid in getting his device transferred from [facility] to [place].  Once done pt understands we will obtain a transmission and then schedule him for visit w/ Camnitz. Patient verbalized understanding and agreeable to plan.

## 2018-12-26 NOTE — Telephone Encounter (Signed)
Shanna from Dr. Einar Gip office called and agreed to release the pt in Carelink.

## 2018-12-31 NOTE — Telephone Encounter (Signed)
error 

## 2019-01-02 NOTE — Telephone Encounter (Signed)
Patient will be scheduled for 1 year appt with Dr. Virgina Jock

## 2019-01-13 DIAGNOSIS — I480 Paroxysmal atrial fibrillation: Secondary | ICD-10-CM

## 2019-01-13 DIAGNOSIS — Z950 Presence of cardiac pacemaker: Secondary | ICD-10-CM

## 2019-01-13 DIAGNOSIS — Z45018 Encounter for adjustment and management of other part of cardiac pacemaker: Secondary | ICD-10-CM

## 2019-01-16 ENCOUNTER — Telehealth: Payer: Self-pay

## 2019-01-16 NOTE — Telephone Encounter (Signed)
The pt called because he received the error code 3248 on his home monitor. I gave him the number to [facility] support. I notice that Dr. Nadyne Coombes office still have not release Charles Ellison in [place] yet.   I Called Dr. Nadyne Coombes office again and talked with Rachel Moulds again. She asked was the pt coming to our office permanently. I told her I was told by the pt to have him released to our office in [place]. She states she will do it. This is the 3rd time talking to Dr. Nadyne Coombes office to get this pt released.

## 2019-01-17 NOTE — Telephone Encounter (Signed)
Spoke with patient and he stated that he hasn't called Carelink tech support. He said he lets it charge all the time but he still gets the error code 3248. Monitor will send automatic transmissions. Patient stated that he would call carelink today.

## 2019-01-20 ENCOUNTER — Other Ambulatory Visit: Payer: Self-pay | Admitting: Cardiology

## 2019-01-20 ENCOUNTER — Ambulatory Visit (INDEPENDENT_AMBULATORY_CARE_PROVIDER_SITE_OTHER): Payer: PPO | Admitting: *Deleted

## 2019-01-20 DIAGNOSIS — I455 Other specified heart block: Secondary | ICD-10-CM | POA: Diagnosis not present

## 2019-01-20 DIAGNOSIS — R001 Bradycardia, unspecified: Secondary | ICD-10-CM

## 2019-01-20 DIAGNOSIS — I10 Essential (primary) hypertension: Secondary | ICD-10-CM

## 2019-01-20 NOTE — Telephone Encounter (Signed)
Transmission received 01/17/2019

## 2019-01-21 ENCOUNTER — Other Ambulatory Visit: Payer: Self-pay

## 2019-01-21 LAB — CUP PACEART REMOTE DEVICE CHECK
Battery Remaining Longevity: 132 mo
Battery Voltage: 3.02 V
Brady Statistic AP VP Percent: 0.08 %
Brady Statistic AP VS Percent: 99.54 %
Brady Statistic AS VP Percent: 0 %
Brady Statistic AS VS Percent: 0.38 %
Brady Statistic RA Percent Paced: 99.65 %
Brady Statistic RV Percent Paced: 0.08 %
Date Time Interrogation Session: 20200605151152
Implantable Lead Implant Date: 20180517
Implantable Lead Implant Date: 20180517
Implantable Lead Location: 753859
Implantable Lead Location: 753860
Implantable Lead Model: 5076
Implantable Lead Model: 5076
Implantable Pulse Generator Implant Date: 20180517
Lead Channel Impedance Value: 285 Ohm
Lead Channel Impedance Value: 323 Ohm
Lead Channel Impedance Value: 361 Ohm
Lead Channel Impedance Value: 494 Ohm
Lead Channel Pacing Threshold Amplitude: 0.75 V
Lead Channel Pacing Threshold Amplitude: 1.25 V
Lead Channel Pacing Threshold Pulse Width: 0.4 ms
Lead Channel Pacing Threshold Pulse Width: 0.4 ms
Lead Channel Sensing Intrinsic Amplitude: 1.5 mV
Lead Channel Sensing Intrinsic Amplitude: 1.5 mV
Lead Channel Sensing Intrinsic Amplitude: 16.875 mV
Lead Channel Sensing Intrinsic Amplitude: 16.875 mV
Lead Channel Setting Pacing Amplitude: 2 V
Lead Channel Setting Pacing Amplitude: 2.75 V
Lead Channel Setting Pacing Pulse Width: 0.4 ms
Lead Channel Setting Sensing Sensitivity: 2 mV

## 2019-01-22 ENCOUNTER — Encounter: Payer: Self-pay | Admitting: Cardiology

## 2019-01-22 ENCOUNTER — Ambulatory Visit: Payer: Self-pay | Admitting: Cardiology

## 2019-01-28 NOTE — Progress Notes (Signed)
Remote pacemaker transmission.   

## 2019-01-31 ENCOUNTER — Telehealth: Payer: Self-pay | Admitting: Cardiology

## 2019-01-31 NOTE — Telephone Encounter (Signed)
Pt scheduled for in-office visit w/ Camnitz to re-establish, next Thursday. Instructions on coming into the office during pandemic rv w/ pt Patient verbalized understanding and agreeable to plan.

## 2019-01-31 NOTE — Telephone Encounter (Signed)
New message   Per the previous message on 12/24/18 the patient states that he has sent a transmission in and now wants to set up an appointment with Dr. Curt Bears. Please call patient.

## 2019-02-06 ENCOUNTER — Encounter: Payer: Self-pay | Admitting: Cardiology

## 2019-02-06 ENCOUNTER — Other Ambulatory Visit: Payer: Self-pay

## 2019-02-06 ENCOUNTER — Ambulatory Visit: Payer: PPO | Admitting: Cardiology

## 2019-02-06 VITALS — BP 126/80 | HR 65 | Ht 70.0 in | Wt 196.0 lb

## 2019-02-06 DIAGNOSIS — I495 Sick sinus syndrome: Secondary | ICD-10-CM | POA: Diagnosis not present

## 2019-02-06 DIAGNOSIS — I10 Essential (primary) hypertension: Secondary | ICD-10-CM | POA: Diagnosis not present

## 2019-02-06 LAB — CUP PACEART INCLINIC DEVICE CHECK
Battery Remaining Longevity: 131 mo
Battery Voltage: 3.02 V
Brady Statistic AP VP Percent: 0.23 %
Brady Statistic AP VS Percent: 98.67 %
Brady Statistic AS VP Percent: 0.02 %
Brady Statistic AS VS Percent: 1.08 %
Brady Statistic RA Percent Paced: 98.98 %
Brady Statistic RV Percent Paced: 0.25 %
Date Time Interrogation Session: 20200625164403
Implantable Lead Implant Date: 20180517
Implantable Lead Implant Date: 20180517
Implantable Lead Location: 753859
Implantable Lead Location: 753860
Implantable Lead Model: 5076
Implantable Lead Model: 5076
Implantable Pulse Generator Implant Date: 20180517
Lead Channel Impedance Value: 285 Ohm
Lead Channel Impedance Value: 323 Ohm
Lead Channel Impedance Value: 361 Ohm
Lead Channel Impedance Value: 475 Ohm
Lead Channel Pacing Threshold Amplitude: 0.625 V
Lead Channel Pacing Threshold Amplitude: 1.25 V
Lead Channel Pacing Threshold Pulse Width: 0.4 ms
Lead Channel Pacing Threshold Pulse Width: 0.4 ms
Lead Channel Sensing Intrinsic Amplitude: 1.375 mV
Lead Channel Sensing Intrinsic Amplitude: 1.375 mV
Lead Channel Sensing Intrinsic Amplitude: 22.125 mV
Lead Channel Setting Pacing Amplitude: 2 V
Lead Channel Setting Pacing Amplitude: 2.5 V
Lead Channel Setting Pacing Pulse Width: 0.4 ms
Lead Channel Setting Sensing Sensitivity: 2 mV

## 2019-02-06 MED ORDER — METOPROLOL SUCCINATE ER 50 MG PO TB24: ORAL_TABLET | ORAL | 11 refills | Status: DC

## 2019-02-06 NOTE — Addendum Note (Signed)
Addended by: Stanton Kidney on: 02/06/2019 05:40 PM   Modules accepted: Orders

## 2019-02-06 NOTE — Progress Notes (Signed)
Electrophysiology Office Note   Date:  02/06/2019   ID:  Charles Ellison., DOB 04-13-1951, MRN [id number]  PCP:  Charles Huddle, MD  Cardiologist:  Charles Ellison Primary Electrophysiologist:   Charles Leeds, MD    No chief complaint on file.    History of Present Illness: Charles Ellison. is a 68 y.o. male who is being seen today for the evaluation of pacemaker at the request of Charles Huddle, MD. Presenting today for electrophysiology evaluation.  He has a history of hypertension and sinus arrest.  He is now status post Medtronic dual-chamber pacemaker implanted May 2018.  Today, he denies symptoms of palpitations, chest pain, shortness of breath, orthopnea, PND, lower extremity edema, claudication, dizziness, presyncope, syncope, bleeding, or neurologic sequela. The patient is tolerating medications without difficulties.    Past Medical History:  Diagnosis Date  . Essential hypertension   . Sinus arrest 12/27/2016   [name] 12/27/2016   Past Surgical History:  Procedure Laterality Date  . PACEMAKER IMPLANT N/A 12/28/2016   Procedure: Pacemaker Implant;  Surgeon: Constance Haw, MD;  Location: [place] [facility];  Service: Cardiovascular;  Laterality: N/A;  . SHOULDER ARTHROSCOPY Left ~ 2005   "for calcium deposits; impinged shoulder"     Current Outpatient Medications  Medication Sig Dispense Refill  . metoprolol succinate (TOPROL-XL) 50 MG 24 hr tablet TAKE 1 TABLET(50 MG) BY MOUTH DAILY 30 tablet 5   No current facility-administered medications for this visit.     Allergies:   Patient has no known allergies.   Social History:  The patient  reports that he has never smoked. He has never used smokeless tobacco. He reports current alcohol use. He reports that he does not use drugs.   Family History:  The patient's family history includes Heart disease in his mother; Valvular heart disease in his father.    ROS:  Please see the history of present illness.    Otherwise, review of systems is positive for none.   All other systems are reviewed and negative.    PHYSICAL EXAM: VS:  BP 126/80   Pulse 65   Ht 5\' 10"  (1.778 m)   Wt 196 lb (88.9 kg)   BMI 28.12 kg/m  , BMI Body mass index is 28.12 kg/m. GEN: Well nourished, well developed, in no acute distress  HEENT: normal  Neck: no JVD, carotid bruits, or masses Cardiac: RRR; no murmurs, rubs, or gallops,no edema  Respiratory:  clear to auscultation bilaterally, normal work of breathing GI: soft, nontender, nondistended, + BS MS: no deformity or atrophy  Skin: warm and dry, device pocket is well healed Neuro:  Strength and sensation are intact Psych: euthymic mood, full affect  EKG:  EKG is ordered today. Personal review of the ekg ordered shows atrial paced, ventricular sensed  Device interrogation is reviewed today in detail.  See PaceArt for details.   Recent Labs: No results found for requested labs within last 8760 hours.    Lipid Panel  No results found for: CHOL, TRIG, HDL, CHOLHDL, VLDL, LDLCALC, LDLDIRECT   Wt Readings from Last 3 Encounters:  02/06/19 196 lb (88.9 kg)  12/27/16 184 lb (83.5 kg)  01/13/15 191 lb 6 oz (86.8 kg)      Other studies Reviewed: Additional studies/ records that were reviewed today include: TTE 12/28/16  Review of the above records today demonstrates:  - Left ventricle: The cavity size was normal. Systolic function was   normal. The estimated ejection fraction  was in the range of 55%   to 60%. Wall motion was normal; there were no regional wall   motion abnormalities. Left ventricular diastolic function   parameters were normal. - Aortic valve: There was trivial regurgitation. - Mitral valve: There was mild regurgitation. - Pulmonary arteries: The main pulmonary artery was normal-sized.   PA peak pressure: 29 mm Hg (S).    ASSESSMENT AND PLAN:  1.  Sinus arrest: Status post Medtronic dual-chamber pacemaker implanted 12/28/2016.   Device functioning appropriately.  No changes at this time.  2.  Hypertension: Currently well controlled.  We  refill his metoprolol today.    Current medicines are reviewed at length with the patient today.   The patient does not have concerns regarding his medicines.  The following changes were made today:  none  Labs/ tests ordered today include:  Orders Placed This Encounter  Procedures  . EKG 12-Lead     Disposition:   FU with   1 year  Signed,  Charles Leeds, MD  02/06/2019 4:28 PM     [facility] [address] [place] [address] [phone] (office) [phone] (fax)

## 2019-02-11 DIAGNOSIS — Z79899 Other long term (current) drug therapy: Secondary | ICD-10-CM | POA: Diagnosis not present

## 2019-02-11 DIAGNOSIS — Z0000 Encounter for general adult medical examination without abnormal findings: Secondary | ICD-10-CM | POA: Diagnosis not present

## 2019-02-11 DIAGNOSIS — Z23 Encounter for immunization: Secondary | ICD-10-CM | POA: Diagnosis not present

## 2019-02-11 DIAGNOSIS — E782 Mixed hyperlipidemia: Secondary | ICD-10-CM | POA: Diagnosis not present

## 2019-02-11 DIAGNOSIS — Z125 Encounter for screening for malignant neoplasm of prostate: Secondary | ICD-10-CM | POA: Diagnosis not present

## 2019-02-11 DIAGNOSIS — I10 Essential (primary) hypertension: Secondary | ICD-10-CM | POA: Diagnosis not present

## 2019-02-11 DIAGNOSIS — Z950 Presence of cardiac pacemaker: Secondary | ICD-10-CM | POA: Diagnosis not present

## 2019-02-23 NOTE — Telephone Encounter (Signed)
Patient not seen by me for 2 years and missed appointments. Now under care of Dr. Curt Bears

## 2019-02-25 NOTE — Telephone Encounter (Signed)
Pt aware and will stay with camnitz

## 2019-04-22 ENCOUNTER — Ambulatory Visit (INDEPENDENT_AMBULATORY_CARE_PROVIDER_SITE_OTHER): Payer: PPO | Admitting: *Deleted

## 2019-04-22 DIAGNOSIS — I495 Sick sinus syndrome: Secondary | ICD-10-CM

## 2019-04-22 LAB — CUP PACEART REMOTE DEVICE CHECK
Battery Remaining Longevity: 128 mo
Battery Voltage: 3.02 V
Brady Statistic AP VP Percent: 0.13 %
Brady Statistic AP VS Percent: 99.49 %
Brady Statistic AS VP Percent: 0.01 %
Brady Statistic AS VS Percent: 0.37 %
Brady Statistic RA Percent Paced: 99.63 %
Brady Statistic RV Percent Paced: 0.13 %
Date Time Interrogation Session: 20200908055949
Implantable Lead Implant Date: 20180517
Implantable Lead Implant Date: 20180517
Implantable Lead Location: 753859
Implantable Lead Location: 753860
Implantable Lead Model: 5076
Implantable Lead Model: 5076
Implantable Pulse Generator Implant Date: 20180517
Lead Channel Impedance Value: 285 Ohm
Lead Channel Impedance Value: 304 Ohm
Lead Channel Impedance Value: 361 Ohm
Lead Channel Impedance Value: 456 Ohm
Lead Channel Pacing Threshold Amplitude: 0.75 V
Lead Channel Pacing Threshold Amplitude: 1.375 V
Lead Channel Pacing Threshold Pulse Width: 0.4 ms
Lead Channel Pacing Threshold Pulse Width: 0.4 ms
Lead Channel Sensing Intrinsic Amplitude: 1.625 mV
Lead Channel Sensing Intrinsic Amplitude: 1.625 mV
Lead Channel Sensing Intrinsic Amplitude: 20.25 mV
Lead Channel Sensing Intrinsic Amplitude: 20.25 mV
Lead Channel Setting Pacing Amplitude: 2 V
Lead Channel Setting Pacing Amplitude: 2.75 V
Lead Channel Setting Pacing Pulse Width: 0.4 ms
Lead Channel Setting Sensing Sensitivity: 2 mV

## 2019-04-29 ENCOUNTER — Telehealth: Payer: Self-pay

## 2019-04-29 NOTE — Telephone Encounter (Signed)
I spoke with the pt and he states he do not want to transfer to [facility]. I have rejected there request in [place]. The pt thanked me for calling to check with him first.

## 2019-04-29 NOTE — Telephone Encounter (Signed)
Refer to last note

## 2019-05-07 ENCOUNTER — Encounter: Payer: Self-pay | Admitting: Cardiology

## 2019-05-07 NOTE — Progress Notes (Signed)
Remote pacemaker transmission.   

## 2019-05-16 ENCOUNTER — Telehealth: Payer: Self-pay

## 2019-05-16 NOTE — Telephone Encounter (Signed)
I spoke with the pt and he states he do not want to switch to [facility]. Pt states he wants to stay with our office. I let the pt know that I have reject [facility] request in [place]. The pt states he will give them a call to let them know he wants to keep his Care with [facility]. The pt thanked me for giving him a call.

## 2019-05-16 NOTE — Telephone Encounter (Signed)
I left a message for the pt to call me at my direct office number. I need to confirm with the pt if he wants to be release in [facility]link to [facility].

## 2019-06-30 DIAGNOSIS — Z20828 Contact with and (suspected) exposure to other viral communicable diseases: Secondary | ICD-10-CM | POA: Diagnosis not present

## 2019-07-18 DIAGNOSIS — Z961 Presence of intraocular lens: Secondary | ICD-10-CM | POA: Diagnosis not present

## 2019-07-18 DIAGNOSIS — H524 Presbyopia: Secondary | ICD-10-CM | POA: Diagnosis not present

## 2019-07-22 ENCOUNTER — Ambulatory Visit (INDEPENDENT_AMBULATORY_CARE_PROVIDER_SITE_OTHER): Payer: PPO | Admitting: *Deleted

## 2019-07-22 DIAGNOSIS — I498 Other specified cardiac arrhythmias: Secondary | ICD-10-CM

## 2019-07-22 DIAGNOSIS — I495 Sick sinus syndrome: Secondary | ICD-10-CM

## 2019-07-22 LAB — CUP PACEART REMOTE DEVICE CHECK
Battery Remaining Longevity: 124 mo
Battery Voltage: 3.01 V
Brady Statistic AP VP Percent: 0.1 %
Brady Statistic AP VS Percent: 99.54 %
Brady Statistic AS VP Percent: 0 %
Brady Statistic AS VS Percent: 0.36 %
Brady Statistic RA Percent Paced: 99.66 %
Brady Statistic RV Percent Paced: 0.1 %
Date Time Interrogation Session: 20201207195140
Implantable Lead Implant Date: 20180517
Implantable Lead Implant Date: 20180517
Implantable Lead Location: 753859
Implantable Lead Location: 753860
Implantable Lead Model: 5076
Implantable Lead Model: 5076
Implantable Pulse Generator Implant Date: 20180517
Lead Channel Impedance Value: 285 Ohm
Lead Channel Impedance Value: 304 Ohm
Lead Channel Impedance Value: 361 Ohm
Lead Channel Impedance Value: 437 Ohm
Lead Channel Pacing Threshold Amplitude: 0.625 V
Lead Channel Pacing Threshold Amplitude: 1.25 V
Lead Channel Pacing Threshold Pulse Width: 0.4 ms
Lead Channel Pacing Threshold Pulse Width: 0.4 ms
Lead Channel Sensing Intrinsic Amplitude: 1.875 mV
Lead Channel Sensing Intrinsic Amplitude: 1.875 mV
Lead Channel Sensing Intrinsic Amplitude: 18.5 mV
Lead Channel Sensing Intrinsic Amplitude: 18.5 mV
Lead Channel Setting Pacing Amplitude: 2 V
Lead Channel Setting Pacing Amplitude: 2.5 V
Lead Channel Setting Pacing Pulse Width: 0.4 ms
Lead Channel Setting Sensing Sensitivity: 2 mV

## 2019-08-14 ENCOUNTER — Telehealth: Payer: Self-pay

## 2019-08-14 NOTE — Telephone Encounter (Signed)
error 

## 2019-08-22 NOTE — Progress Notes (Signed)
PPM Remote  

## 2019-09-03 ENCOUNTER — Ambulatory Visit: Payer: PPO | Attending: Internal Medicine

## 2019-09-03 DIAGNOSIS — Z23 Encounter for immunization: Secondary | ICD-10-CM | POA: Insufficient documentation

## 2019-09-03 NOTE — Progress Notes (Signed)
   [id number]ination [facility]  Name:  [name].    MRN: [id number] DOB: 1951-03-01  09/03/2019  [name] was observed post Covid-19 immunization for 15 minutes without incidence. He was provided with Vaccine Information Sheet and instruction to access the V-Safe system.   [name] was instructed to call 911 with any severe reactions post vaccine: Marland Kitchen Difficulty breathing  . Swelling of your face and throat  . A fast heartbeat  . A bad rash all over your body  . Dizziness and weakness    Immunizations Administered    Name Date Dose VIS Date Route   Pfizer COVID-19 Vaccine 09/03/2019 11:46 AM 0.3 mL 07/25/2019 Intramuscular   Manufacturer: [place]   Lot: [id number]   [place]: [id number]

## 2019-09-24 ENCOUNTER — Ambulatory Visit: Payer: PPO | Attending: Internal Medicine

## 2019-09-24 DIAGNOSIS — Z23 Encounter for immunization: Secondary | ICD-10-CM | POA: Insufficient documentation

## 2019-09-24 NOTE — Progress Notes (Signed)
   [id number] Vaccination Clinic  Name:  Charles Ellison.    MRN: [id number] DOB: [birth year]  09/24/2019  Mr. Charles Ellison was observed post Covid-19 immunization for 15 minutes without incidence. He was provided with Vaccine Information Sheet and instruction to access the V-Safe system.   Mr. Charles Ellison was instructed to call 911 with any severe reactions post vaccine: Marland Kitchen Difficulty breathing  . Swelling of your face and throat  . A fast heartbeat  . A bad rash all over your body  . Dizziness and weakness    Immunizations Administered    Name Date Dose VIS Date Route   Pfizer COVID-19 Vaccine 09/24/2019  8:09 AM 0.3 mL 07/25/2019 Intramuscular   Manufacturer: [place]   Lot: [id number]   [place]: [id number]

## 2019-10-21 ENCOUNTER — Ambulatory Visit (INDEPENDENT_AMBULATORY_CARE_PROVIDER_SITE_OTHER): Payer: PPO | Admitting: *Deleted

## 2019-10-21 DIAGNOSIS — I495 Sick sinus syndrome: Secondary | ICD-10-CM | POA: Diagnosis not present

## 2019-10-21 DIAGNOSIS — I498 Other specified cardiac arrhythmias: Secondary | ICD-10-CM

## 2019-10-21 LAB — CUP PACEART REMOTE DEVICE CHECK
Battery Remaining Longevity: 122 mo
Battery Voltage: 3.01 V
Brady Statistic AP VP Percent: 0.07 %
Brady Statistic AP VS Percent: 99.58 %
Brady Statistic AS VP Percent: 0 %
Brady Statistic AS VS Percent: 0.35 %
Brady Statistic RA Percent Paced: 99.65 %
Brady Statistic RV Percent Paced: 0.07 %
Date Time Interrogation Session: 20210308221645
Implantable Lead Implant Date: 20180517
Implantable Lead Implant Date: 20180517
Implantable Lead Location: 753859
Implantable Lead Location: 753860
Implantable Lead Model: 5076
Implantable Lead Model: 5076
Implantable Pulse Generator Implant Date: 20180517
Lead Channel Impedance Value: 304 Ohm
Lead Channel Impedance Value: 323 Ohm
Lead Channel Impedance Value: 380 Ohm
Lead Channel Impedance Value: 456 Ohm
Lead Channel Pacing Threshold Amplitude: 0.75 V
Lead Channel Pacing Threshold Amplitude: 1.375 V
Lead Channel Pacing Threshold Pulse Width: 0.4 ms
Lead Channel Pacing Threshold Pulse Width: 0.4 ms
Lead Channel Sensing Intrinsic Amplitude: 1.625 mV
Lead Channel Sensing Intrinsic Amplitude: 1.625 mV
Lead Channel Sensing Intrinsic Amplitude: 18 mV
Lead Channel Sensing Intrinsic Amplitude: 18 mV
Lead Channel Setting Pacing Amplitude: 2 V
Lead Channel Setting Pacing Amplitude: 2.75 V
Lead Channel Setting Pacing Pulse Width: 0.4 ms
Lead Channel Setting Sensing Sensitivity: 2 mV

## 2019-10-22 NOTE — Progress Notes (Signed)
PPM Remote  

## 2020-01-20 ENCOUNTER — Ambulatory Visit (INDEPENDENT_AMBULATORY_CARE_PROVIDER_SITE_OTHER): Payer: PPO | Admitting: *Deleted

## 2020-01-20 DIAGNOSIS — R001 Bradycardia, unspecified: Secondary | ICD-10-CM

## 2020-01-20 LAB — CUP PACEART REMOTE DEVICE CHECK
Battery Remaining Longevity: 119 mo
Battery Voltage: 3.01 V
Brady Statistic AP VP Percent: 0.1 %
Brady Statistic AP VS Percent: 99.46 %
Brady Statistic AS VP Percent: 0.01 %
Brady Statistic AS VS Percent: 0.43 %
Brady Statistic RA Percent Paced: 99.58 %
Brady Statistic RV Percent Paced: 0.11 %
Date Time Interrogation Session: 20210607205312
Implantable Lead Implant Date: 20180517
Implantable Lead Implant Date: 20180517
Implantable Lead Location: 753859
Implantable Lead Location: 753860
Implantable Lead Model: 5076
Implantable Lead Model: 5076
Implantable Pulse Generator Implant Date: 20180517
Lead Channel Impedance Value: 304 Ohm
Lead Channel Impedance Value: 323 Ohm
Lead Channel Impedance Value: 380 Ohm
Lead Channel Impedance Value: 475 Ohm
Lead Channel Pacing Threshold Amplitude: 0.625 V
Lead Channel Pacing Threshold Amplitude: 1.375 V
Lead Channel Pacing Threshold Pulse Width: 0.4 ms
Lead Channel Pacing Threshold Pulse Width: 0.4 ms
Lead Channel Sensing Intrinsic Amplitude: 1 mV
Lead Channel Sensing Intrinsic Amplitude: 1 mV
Lead Channel Sensing Intrinsic Amplitude: 19.125 mV
Lead Channel Sensing Intrinsic Amplitude: 19.125 mV
Lead Channel Setting Pacing Amplitude: 2 V
Lead Channel Setting Pacing Amplitude: 2.75 V
Lead Channel Setting Pacing Pulse Width: 0.4 ms
Lead Channel Setting Sensing Sensitivity: 2 mV

## 2020-01-21 NOTE — Progress Notes (Signed)
Remote pacemaker transmission.   

## 2020-01-23 ENCOUNTER — Telehealth: Payer: Self-pay | Admitting: *Deleted

## 2020-01-23 MED ORDER — METOPROLOL SUCCINATE ER 100 MG PO TB24: 100.0000 mg | ORAL_TABLET | Freq: Every day | ORAL | 3 refills | Status: DC

## 2020-01-23 NOTE — Telephone Encounter (Signed)
-----   Message from [name], MD sent at 01/20/2020  3:04 PM EDT ----- Abnormal device interrogation reviewed.  Lead parameters and battery status stable.  19 beats NSVT. Incresase toprol xl to 100 mg.

## 2020-01-23 NOTE — Telephone Encounter (Signed)
Pt informed of transmission findings and recommendation.  He will take two 50 mg tablets daily until he f/u w/ PA next week for his yearly OV. Will update chart, but not send in Rx.  If tolerating aware to request updated Rx to be sent at [place]. I attempted to explain to pt the best I could what NSVT was.  Went back as far as 01/2019 transmission (and made pt aware of each transmission finding), 5 nsvt episodes then, another 14 beat run on 07/2019 transmission and then this current transmission findings. Explained rationale as to why medication should be increased at this point.  Pt aware that Oda Kilts will further educate pt on this arrhythmia at [facility] next week.  Will forward to him for his FYI of further education discussion. Pt agreeable to plan and will call office if SE begin after medication increase.

## 2020-01-27 NOTE — Progress Notes (Signed)
Electrophysiology Office Note Date: 01/28/2020  ID:  Charles Crimes., DOB 1951-01-19, MRN [id number]  PCP: Josetta Huddle, MD Primary Cardiologist: No primary care provider on file. Electrophysiologist: [name], MD   CC: Pacemaker follow-up  Charles Pinn. is a 69 y.o. male seen today for [name], MD for routine electrophysiology followup.  Since last being seen in our clinic the patient reports doing very well. He was not aware of his NSVT. He has not yet increased his metoprolol. He denies chest pain, palpitations, dyspnea, PND, orthopnea, nausea, vomiting, dizziness, syncope, edema, weight gain, or early satiety.  Device History: Medtronic Dual Chamber PPM implanted 12/2016 for sinus arrest  Past Medical History:  Diagnosis Date  . Essential hypertension   . Sinus arrest 12/27/2016   Charles Ellison 12/27/2016   Past Surgical History:  Procedure Laterality Date  . PACEMAKER IMPLANT N/A 12/28/2016   Procedure: Pacemaker Implant;  Surgeon: Constance Haw, MD;  Location: [place] [facility];  Service: Cardiovascular;  Laterality: N/A;  . SHOULDER ARTHROSCOPY Left ~ 2005   "for calcium deposits; impinged shoulder"    Current Outpatient Medications  Medication Sig Dispense Refill  . metoprolol succinate (TOPROL-XL) 100 MG 24 hr tablet Take 1 tablet (100 mg total) by mouth daily. Take with or immediately following a meal. 90 tablet 3   No current facility-administered medications for this visit.    Allergies:   Patient has no known allergies.   Social History: Social History   Socioeconomic History  . Marital status: Married    Spouse name: Not on file  . Number of children: Not on file  . Years of education: Not on file  . Highest education level: Not on file  Occupational History  . Not on file  Tobacco Use  . Smoking status: Never Smoker  . Smokeless tobacco: Never Used  Vaping Use  . Vaping Use: Never used  Substance and Sexual  Activity  . Alcohol use: Yes    Alcohol/week: 0.0 standard drinks    Comment: 12/27/2016 "might have 1 drink/month"  . Drug use: No  . Sexual activity: Yes  Other Topics Concern  . Not on file  Social History Narrative  . Not on file   Social Determinants of Health   Financial Resource Strain:   . Difficulty of Paying Living Expenses:   Food Insecurity:   . Worried About Charity fundraiser in the Last Year:   . Arboriculturist in the Last Year:   Transportation Needs:   . Film/video editor (Medical):   Marland Kitchen Lack of Transportation (Non-Medical):   Physical Activity:   . Days of Exercise per Week:   . Minutes of Exercise per Session:   Stress:   . Feeling of Stress :   Social Connections:   . Frequency of Communication with Friends and Family:   . Frequency of Social Gatherings with Friends and Family:   . Attends Religious Services:   . Active Member of Clubs or Organizations:   . Attends Archivist Meetings:   Marland Kitchen Marital Status:   Intimate Partner Violence:   . Fear of Current or Ex-Partner:   . Emotionally Abused:   Marland Kitchen Physically Abused:   . Sexually Abused:     Family History: Family History  Problem Relation Age of Onset  . Heart disease Mother        died at 46 unclear heart condition  . Valvular heart disease  Father        died at 20, 2/2 traumatic SDH     Review of Systems: All other systems reviewed and are otherwise negative except as noted above.  Physical Exam: Vitals:   01/28/20 1027  BP: 120/78  Pulse: 62  SpO2: 96%  Weight: 197 lb (89.4 kg)  Height: 5\' 10"  (1.778 m)     GEN- The patient is well appearing, alert and oriented x 3 today.   HEENT: normocephalic, atraumatic; sclera clear, conjunctiva pink; hearing intact; oropharynx clear; neck supple  Lungs- Clear to ausculation bilaterally, normal work of breathing.  No wheezes, rales, rhonchi Heart- Regular rate and rhythm, no murmurs, rubs or gallops  GI- soft, non-tender,  non-distended, bowel sounds present  Extremities- no clubbing, cyanosis, or edema  MS- no significant deformity or atrophy Skin- warm and dry, no rash or lesion; PPM pocket well healed Psych- euthymic mood, full affect Neuro- strength and sensation are intact  PPM Interrogation- reviewed in detail today,  See PACEART report  EKG:  EKG is ordered today. The ekg ordered today shows A paced at 62 bpm.   Recent Labs: No results found for requested labs within last 8760 hours.   Wt Readings from Last 3 Encounters:  01/28/20 197 lb (89.4 kg)  02/06/19 196 lb (88.9 kg)  12/27/16 184 lb (83.5 kg)     Other studies Reviewed: Additional studies/ records that were reviewed today include: Previous EP office notes, Previous remote checks, Most recent labwork.   Assessment and Plan:  1. Sinus arrest s/p Medtronic PPM  Normal PPM function See Pace Art report No changes today  2. Hypertension Currently well controlled  3. NSVT Increase toprol to 100 mg daily as previously instructed.  He denies chest pain or any other symptoms at this time. He would prefer to hold off on echocardiography unless he has recurrence.  Labs today.  Current medicines are reviewed at length with the patient today.   The patient does not have concerns regarding his medicines.  The following changes were made today:  Increased metoprolol as discussed in phone notes.   Labs/ tests ordered today include:  Orders Placed This Encounter  Procedures  . Basic Metabolic Panel (BMET)  . CBC w/Diff  . TSH  . Magnesium  . EKG 12-Lead    Disposition:   Follow up with Dr. Curt Bears in 12 Months.   Charles Lefevre, PA-C  01/28/2020 2:00 PM  [place] [address] [phone] (office) [phone] (fax)

## 2020-01-28 ENCOUNTER — Other Ambulatory Visit: Payer: Self-pay

## 2020-01-28 ENCOUNTER — Encounter: Payer: Self-pay | Admitting: Student

## 2020-01-28 ENCOUNTER — Ambulatory Visit: Payer: PPO | Admitting: Student

## 2020-01-28 VITALS — BP 120/78 | HR 62 | Ht 70.0 in | Wt 197.0 lb

## 2020-01-28 DIAGNOSIS — R001 Bradycardia, unspecified: Secondary | ICD-10-CM

## 2020-01-28 DIAGNOSIS — I495 Sick sinus syndrome: Secondary | ICD-10-CM

## 2020-01-28 DIAGNOSIS — I4729 Other ventricular tachycardia: Secondary | ICD-10-CM

## 2020-01-28 DIAGNOSIS — I472 Ventricular tachycardia: Secondary | ICD-10-CM | POA: Diagnosis not present

## 2020-01-28 DIAGNOSIS — I10 Essential (primary) hypertension: Secondary | ICD-10-CM

## 2020-01-28 LAB — BASIC METABOLIC PANEL
BUN/Creatinine Ratio: 15 (ref 10–24)
BUN: 16 mg/dL (ref 8–27)
CO2: 25 mmol/L (ref 20–29)
Calcium: 9.6 mg/dL (ref 8.6–10.2)
Chloride: 104 mmol/L (ref 96–106)
Creatinine, Ser: 1.1 mg/dL (ref 0.76–1.27)
GFR calc Af Amer: 80 mL/min/{1.73_m2} (ref >59)
GFR calc non Af Amer: 70 mL/min/{1.73_m2} (ref >59)
Glucose: 88 mg/dL (ref 65–99)
Potassium: 4.3 mmol/L (ref 3.5–5.2)
Sodium: 143 mmol/L (ref 134–144)

## 2020-01-28 LAB — CUP PACEART INCLINIC DEVICE CHECK
Battery Remaining Longevity: 120 mo
Battery Voltage: 3.01 V
Brady Statistic AP VP Percent: 0.11 %
Brady Statistic AP VS Percent: 99.5 %
Brady Statistic AS VP Percent: 0.01 %
Brady Statistic AS VS Percent: 0.38 %
Brady Statistic RA Percent Paced: 99.63 %
Brady Statistic RV Percent Paced: 0.12 %
Date Time Interrogation Session: 20210616135506
Implantable Lead Implant Date: 20180517
Implantable Lead Implant Date: 20180517
Implantable Lead Location: 753859
Implantable Lead Location: 753860
Implantable Lead Model: 5076
Implantable Lead Model: 5076
Implantable Pulse Generator Implant Date: 20180517
Lead Channel Impedance Value: 323 Ohm
Lead Channel Impedance Value: 342 Ohm
Lead Channel Impedance Value: 399 Ohm
Lead Channel Impedance Value: 513 Ohm
Lead Channel Pacing Threshold Amplitude: 0.625 V
Lead Channel Pacing Threshold Amplitude: 1.375 V
Lead Channel Pacing Threshold Pulse Width: 0.4 ms
Lead Channel Pacing Threshold Pulse Width: 0.4 ms
Lead Channel Sensing Intrinsic Amplitude: 1.375 mV
Lead Channel Sensing Intrinsic Amplitude: 1.375 mV
Lead Channel Sensing Intrinsic Amplitude: 20.25 mV
Lead Channel Sensing Intrinsic Amplitude: 22.375 mV
Lead Channel Setting Pacing Amplitude: 2 V
Lead Channel Setting Pacing Amplitude: 2.75 V
Lead Channel Setting Pacing Pulse Width: 0.4 ms
Lead Channel Setting Sensing Sensitivity: 2 mV

## 2020-01-28 LAB — CBC WITH DIFFERENTIAL/PLATELET
Basophils Absolute: 0 10*3/uL (ref 0.0–0.2)
Basos: 0 %
EOS (ABSOLUTE): 0.2 10*3/uL (ref 0.0–0.4)
Eos: 3 %
Hematocrit: 46.5 % (ref 37.5–51.0)
Hemoglobin: 16.2 g/dL (ref 13.0–17.7)
Immature Grans (Abs): 0 10*3/uL (ref 0.0–0.1)
Immature Granulocytes: 0 %
Lymphocytes Absolute: 3.7 10*3/uL — ABNORMAL HIGH (ref 0.7–3.1)
Lymphs: 46 %
MCH: 30.5 pg (ref 26.6–33.0)
MCHC: 34.8 g/dL (ref 31.5–35.7)
MCV: 88 fL (ref 79–97)
Monocytes Absolute: 0.7 10*3/uL (ref 0.1–0.9)
Monocytes: 8 %
Neutrophils Absolute: 3.5 10*3/uL (ref 1.4–7.0)
Neutrophils: 43 %
Platelets: 187 10*3/uL (ref 150–450)
RBC: 5.31 x10E6/uL (ref 4.14–5.80)
RDW: 12.1 % (ref 11.6–15.4)
WBC: 8.1 10*3/uL (ref 3.4–10.8)

## 2020-01-28 LAB — MAGNESIUM: Magnesium: 2.3 mg/dL (ref 1.6–2.3)

## 2020-01-28 LAB — TSH: TSH: 1.95 u[IU]/mL (ref 0.450–4.500)

## 2020-01-28 MED ORDER — METOPROLOL SUCCINATE ER 100 MG PO TB24: 100.0000 mg | ORAL_TABLET | Freq: Every day | ORAL | 3 refills | Status: DC

## 2020-01-28 NOTE — Patient Instructions (Addendum)
Medication Instructions:  Your physician has recommended you make the following change in your medication:  --Start Metoprolol (Torpol XL) 100 mg - Take 1 tablet by mouth once daily  *If you need a refill on your cardiac medications before your next appointment, please call your pharmacy*  Lab Work: Your physician has recommended that you have lab work today: BMET, CBC, Mag, TSH  If you have labs (blood work) drawn today and your tests are completely normal, you will receive your results only by: Marland Kitchen MyChart Message (if you have MyChart) OR . A paper copy in the mail If you have any lab test that is abnormal or we need to change your treatment, we will call you to review the results.  Testing/Procedures: NONE  Follow-Up: At [facility], you and your health needs are our priority.  As part of our continuing mission to provide you with exceptional heart care, we have created designated Provider Care Teams.  These Care Teams include your primary Cardiologist (physician) and Advanced Practice Providers (APPs -  Physician Assistants and Nurse Practitioners) who all work together to provide you with the care you need, when you need it.  We recommend signing up for the patient portal called "MyChart".  Sign up information is provided on this After Visit Summary.  MyChart is used to connect with patients for Virtual Visits (Telemedicine).  Patients are able to view lab/test results, encounter notes, upcoming appointments, etc.  Non-urgent messages can be sent to your provider as well.   To learn more about what you can do with MyChart, go to [email or web address].    Your next appointment:   Your physician wants you to follow-up in: 1 YEAR with Dr. Curt Bears. You will receive a reminder letter in the mail two months in advance. If you don't receive a letter, please call our office to schedule the follow-up appointment.  The format for your next appointment:   In Person  Provider:   Allegra Lai, MD  Other Instructions

## 2020-03-09 DIAGNOSIS — F341 Dysthymic disorder: Secondary | ICD-10-CM | POA: Diagnosis not present

## 2020-03-09 DIAGNOSIS — Z0001 Encounter for general adult medical examination with abnormal findings: Secondary | ICD-10-CM | POA: Diagnosis not present

## 2020-03-09 DIAGNOSIS — Z125 Encounter for screening for malignant neoplasm of prostate: Secondary | ICD-10-CM | POA: Diagnosis not present

## 2020-03-09 DIAGNOSIS — L309 Dermatitis, unspecified: Secondary | ICD-10-CM | POA: Diagnosis not present

## 2020-03-09 DIAGNOSIS — I10 Essential (primary) hypertension: Secondary | ICD-10-CM | POA: Diagnosis not present

## 2020-03-09 DIAGNOSIS — B351 Tinea unguium: Secondary | ICD-10-CM | POA: Diagnosis not present

## 2020-03-09 DIAGNOSIS — Z79899 Other long term (current) drug therapy: Secondary | ICD-10-CM | POA: Diagnosis not present

## 2020-03-09 DIAGNOSIS — E782 Mixed hyperlipidemia: Secondary | ICD-10-CM | POA: Diagnosis not present

## 2020-03-09 DIAGNOSIS — Z1211 Encounter for screening for malignant neoplasm of colon: Secondary | ICD-10-CM | POA: Diagnosis not present

## 2020-04-10 ENCOUNTER — Other Ambulatory Visit: Payer: Self-pay | Admitting: Cardiology

## 2020-04-10 DIAGNOSIS — I10 Essential (primary) hypertension: Secondary | ICD-10-CM

## 2020-04-14 DIAGNOSIS — L814 Other melanin hyperpigmentation: Secondary | ICD-10-CM | POA: Diagnosis not present

## 2020-04-14 DIAGNOSIS — L819 Disorder of pigmentation, unspecified: Secondary | ICD-10-CM | POA: Diagnosis not present

## 2020-04-14 DIAGNOSIS — D1801 Hemangioma of skin and subcutaneous tissue: Secondary | ICD-10-CM | POA: Diagnosis not present

## 2020-04-14 DIAGNOSIS — D229 Melanocytic nevi, unspecified: Secondary | ICD-10-CM | POA: Diagnosis not present

## 2020-04-14 DIAGNOSIS — L218 Other seborrheic dermatitis: Secondary | ICD-10-CM | POA: Diagnosis not present

## 2020-04-14 DIAGNOSIS — B351 Tinea unguium: Secondary | ICD-10-CM | POA: Diagnosis not present

## 2020-04-14 DIAGNOSIS — L821 Other seborrheic keratosis: Secondary | ICD-10-CM | POA: Diagnosis not present

## 2020-04-15 DIAGNOSIS — J3089 Other allergic rhinitis: Secondary | ICD-10-CM | POA: Diagnosis not present

## 2020-04-15 DIAGNOSIS — R21 Rash and other nonspecific skin eruption: Secondary | ICD-10-CM | POA: Diagnosis not present

## 2020-04-20 ENCOUNTER — Ambulatory Visit (INDEPENDENT_AMBULATORY_CARE_PROVIDER_SITE_OTHER): Payer: PPO | Admitting: *Deleted

## 2020-04-20 DIAGNOSIS — R001 Bradycardia, unspecified: Secondary | ICD-10-CM

## 2020-04-20 LAB — CUP PACEART REMOTE DEVICE CHECK
Battery Remaining Longevity: 114 mo
Battery Voltage: 3.01 V
Brady Statistic AP VP Percent: 0.27 %
Brady Statistic AP VS Percent: 99.41 %
Brady Statistic AS VP Percent: 0 %
Brady Statistic AS VS Percent: 0.32 %
Brady Statistic RA Percent Paced: 99.68 %
Brady Statistic RV Percent Paced: 0.27 %
Date Time Interrogation Session: 20210906203858
Implantable Lead Implant Date: 20180517
Implantable Lead Implant Date: 20180517
Implantable Lead Location: 753859
Implantable Lead Location: 753860
Implantable Lead Model: 5076
Implantable Lead Model: 5076
Implantable Pulse Generator Implant Date: 20180517
Lead Channel Impedance Value: 285 Ohm
Lead Channel Impedance Value: 304 Ohm
Lead Channel Impedance Value: 380 Ohm
Lead Channel Impedance Value: 437 Ohm
Lead Channel Pacing Threshold Amplitude: 0.625 V
Lead Channel Pacing Threshold Amplitude: 1.375 V
Lead Channel Pacing Threshold Pulse Width: 0.4 ms
Lead Channel Pacing Threshold Pulse Width: 0.4 ms
Lead Channel Sensing Intrinsic Amplitude: 0.625 mV
Lead Channel Sensing Intrinsic Amplitude: 0.625 mV
Lead Channel Sensing Intrinsic Amplitude: 17.875 mV
Lead Channel Sensing Intrinsic Amplitude: 17.875 mV
Lead Channel Setting Pacing Amplitude: 2 V
Lead Channel Setting Pacing Amplitude: 2.75 V
Lead Channel Setting Pacing Pulse Width: 0.4 ms
Lead Channel Setting Sensing Sensitivity: 2 mV

## 2020-04-22 NOTE — Progress Notes (Signed)
Remote pacemaker transmission.   

## 2020-05-17 DIAGNOSIS — L249 Irritant contact dermatitis, unspecified cause: Secondary | ICD-10-CM | POA: Diagnosis not present

## 2020-05-17 DIAGNOSIS — L218 Other seborrheic dermatitis: Secondary | ICD-10-CM | POA: Diagnosis not present

## 2020-05-23 DIAGNOSIS — Z20822 Contact with and (suspected) exposure to covid-19: Secondary | ICD-10-CM | POA: Diagnosis not present

## 2020-07-19 DIAGNOSIS — H524 Presbyopia: Secondary | ICD-10-CM | POA: Diagnosis not present

## 2020-07-19 DIAGNOSIS — Z961 Presence of intraocular lens: Secondary | ICD-10-CM | POA: Diagnosis not present

## 2020-07-20 ENCOUNTER — Ambulatory Visit (INDEPENDENT_AMBULATORY_CARE_PROVIDER_SITE_OTHER): Payer: PPO

## 2020-07-20 DIAGNOSIS — R001 Bradycardia, unspecified: Secondary | ICD-10-CM

## 2020-07-20 LAB — CUP PACEART REMOTE DEVICE CHECK
Battery Remaining Longevity: 112 mo
Battery Voltage: 3 V
Brady Statistic AP VP Percent: 0.08 %
Brady Statistic AP VS Percent: 99.49 %
Brady Statistic AS VP Percent: 0 %
Brady Statistic AS VS Percent: 0.43 %
Brady Statistic RA Percent Paced: 99.56 %
Brady Statistic RV Percent Paced: 0.08 %
Date Time Interrogation Session: 20211206203955
Implantable Lead Implant Date: 20180517
Implantable Lead Implant Date: 20180517
Implantable Lead Location: 753859
Implantable Lead Location: 753860
Implantable Lead Model: 5076
Implantable Lead Model: 5076
Implantable Pulse Generator Implant Date: 20180517
Lead Channel Impedance Value: 285 Ohm
Lead Channel Impedance Value: 323 Ohm
Lead Channel Impedance Value: 380 Ohm
Lead Channel Impedance Value: 456 Ohm
Lead Channel Pacing Threshold Amplitude: 0.625 V
Lead Channel Pacing Threshold Amplitude: 1.25 V
Lead Channel Pacing Threshold Pulse Width: 0.4 ms
Lead Channel Pacing Threshold Pulse Width: 0.4 ms
Lead Channel Sensing Intrinsic Amplitude: 1.5 mV
Lead Channel Sensing Intrinsic Amplitude: 1.5 mV
Lead Channel Sensing Intrinsic Amplitude: 16.75 mV
Lead Channel Sensing Intrinsic Amplitude: 16.75 mV
Lead Channel Setting Pacing Amplitude: 2 V
Lead Channel Setting Pacing Amplitude: 2.75 V
Lead Channel Setting Pacing Pulse Width: 0.4 ms
Lead Channel Setting Sensing Sensitivity: 2 mV

## 2020-07-30 NOTE — Progress Notes (Signed)
Remote pacemaker transmission.   

## 2020-08-19 DIAGNOSIS — Z1159 Encounter for screening for other viral diseases: Secondary | ICD-10-CM | POA: Diagnosis not present

## 2020-08-24 DIAGNOSIS — K648 Other hemorrhoids: Secondary | ICD-10-CM | POA: Diagnosis not present

## 2020-08-24 DIAGNOSIS — K621 Rectal polyp: Secondary | ICD-10-CM | POA: Diagnosis not present

## 2020-08-24 DIAGNOSIS — K5730 Diverticulosis of large intestine without perforation or abscess without bleeding: Secondary | ICD-10-CM | POA: Diagnosis not present

## 2020-08-24 DIAGNOSIS — Z86010 Personal history of colonic polyps: Secondary | ICD-10-CM | POA: Diagnosis not present

## 2020-08-27 DIAGNOSIS — K621 Rectal polyp: Secondary | ICD-10-CM | POA: Diagnosis not present

## 2020-09-20 DIAGNOSIS — D229 Melanocytic nevi, unspecified: Secondary | ICD-10-CM | POA: Diagnosis not present

## 2020-09-20 DIAGNOSIS — L820 Inflamed seborrheic keratosis: Secondary | ICD-10-CM | POA: Diagnosis not present

## 2020-09-20 DIAGNOSIS — L819 Disorder of pigmentation, unspecified: Secondary | ICD-10-CM | POA: Diagnosis not present

## 2020-09-20 DIAGNOSIS — L814 Other melanin hyperpigmentation: Secondary | ICD-10-CM | POA: Diagnosis not present

## 2020-09-20 DIAGNOSIS — D485 Neoplasm of uncertain behavior of skin: Secondary | ICD-10-CM | POA: Diagnosis not present

## 2020-09-20 DIAGNOSIS — L821 Other seborrheic keratosis: Secondary | ICD-10-CM | POA: Diagnosis not present

## 2020-10-19 ENCOUNTER — Ambulatory Visit (INDEPENDENT_AMBULATORY_CARE_PROVIDER_SITE_OTHER): Payer: PPO

## 2020-10-19 DIAGNOSIS — R001 Bradycardia, unspecified: Secondary | ICD-10-CM

## 2020-10-19 LAB — CUP PACEART REMOTE DEVICE CHECK
Battery Remaining Longevity: 109 mo
Battery Voltage: 3 V
Brady Statistic AP VP Percent: 0.07 %
Brady Statistic AP VS Percent: 99.17 %
Brady Statistic AS VP Percent: 0 %
Brady Statistic AS VS Percent: 0.77 %
Brady Statistic RA Percent Paced: 99.24 %
Brady Statistic RV Percent Paced: 0.07 %
Date Time Interrogation Session: 20220307202305
Implantable Lead Implant Date: 20180517
Implantable Lead Implant Date: 20180517
Implantable Lead Location: 753859
Implantable Lead Location: 753860
Implantable Lead Model: 5076
Implantable Lead Model: 5076
Implantable Pulse Generator Implant Date: 20180517
Lead Channel Impedance Value: 304 Ohm
Lead Channel Impedance Value: 323 Ohm
Lead Channel Impedance Value: 380 Ohm
Lead Channel Impedance Value: 456 Ohm
Lead Channel Pacing Threshold Amplitude: 0.625 V
Lead Channel Pacing Threshold Amplitude: 1.375 V
Lead Channel Pacing Threshold Pulse Width: 0.4 ms
Lead Channel Pacing Threshold Pulse Width: 0.4 ms
Lead Channel Sensing Intrinsic Amplitude: 1 mV
Lead Channel Sensing Intrinsic Amplitude: 1 mV
Lead Channel Sensing Intrinsic Amplitude: 18.375 mV
Lead Channel Sensing Intrinsic Amplitude: 18.375 mV
Lead Channel Setting Pacing Amplitude: 2 V
Lead Channel Setting Pacing Amplitude: 2.75 V
Lead Channel Setting Pacing Pulse Width: 0.4 ms
Lead Channel Setting Sensing Sensitivity: 2 mV

## 2020-10-28 NOTE — Progress Notes (Signed)
Remote pacemaker transmission.   

## 2020-12-08 DIAGNOSIS — L600 Ingrowing nail: Secondary | ICD-10-CM | POA: Diagnosis not present

## 2021-01-18 ENCOUNTER — Ambulatory Visit (INDEPENDENT_AMBULATORY_CARE_PROVIDER_SITE_OTHER): Payer: PPO

## 2021-01-18 DIAGNOSIS — I495 Sick sinus syndrome: Secondary | ICD-10-CM | POA: Diagnosis not present

## 2021-01-18 LAB — CUP PACEART REMOTE DEVICE CHECK
Battery Remaining Longevity: 106 mo
Battery Voltage: 3 V
Brady Statistic AP VP Percent: 0.27 %
Brady Statistic AP VS Percent: 98.93 %
Brady Statistic AS VP Percent: 0.01 %
Brady Statistic AS VS Percent: 0.8 %
Brady Statistic RA Percent Paced: 99.21 %
Brady Statistic RV Percent Paced: 0.27 %
Date Time Interrogation Session: 20220607000235
Implantable Lead Implant Date: 20180517
Implantable Lead Implant Date: 20180517
Implantable Lead Location: 753859
Implantable Lead Location: 753860
Implantable Lead Model: 5076
Implantable Lead Model: 5076
Implantable Pulse Generator Implant Date: 20180517
Lead Channel Impedance Value: 304 Ohm
Lead Channel Impedance Value: 304 Ohm
Lead Channel Impedance Value: 380 Ohm
Lead Channel Impedance Value: 437 Ohm
Lead Channel Pacing Threshold Amplitude: 0.5 V
Lead Channel Pacing Threshold Amplitude: 1.375 V
Lead Channel Pacing Threshold Pulse Width: 0.4 ms
Lead Channel Pacing Threshold Pulse Width: 0.4 ms
Lead Channel Sensing Intrinsic Amplitude: 16 mV
Lead Channel Sensing Intrinsic Amplitude: 16 mV
Lead Channel Sensing Intrinsic Amplitude: 3 mV
Lead Channel Sensing Intrinsic Amplitude: 3 mV
Lead Channel Setting Pacing Amplitude: 2 V
Lead Channel Setting Pacing Amplitude: 2.75 V
Lead Channel Setting Pacing Pulse Width: 0.4 ms
Lead Channel Setting Sensing Sensitivity: 2 mV

## 2021-02-05 ENCOUNTER — Other Ambulatory Visit: Payer: Self-pay | Admitting: Student

## 2021-02-07 NOTE — Telephone Encounter (Signed)
Rx(s) sent to pharmacy electronically.  

## 2021-02-09 NOTE — Progress Notes (Signed)
Remote pacemaker transmission.   

## 2021-03-07 ENCOUNTER — Other Ambulatory Visit: Payer: Self-pay

## 2021-03-07 MED ORDER — METOPROLOL SUCCINATE ER 100 MG PO TB24: 100.0000 mg | ORAL_TABLET | Freq: Every day | ORAL | 0 refills | Status: DC

## 2021-03-07 NOTE — Telephone Encounter (Signed)
Received voicemail from patient requesting a refill on his Metoprolol. He states he thinks he needs an appointment and will schedule one if needed.   Chart reviewed.  Follow up appointment scheduled.  Rx(s) sent to pharmacy electronically.  Patient voiced understanding.

## 2021-03-16 DIAGNOSIS — Z125 Encounter for screening for malignant neoplasm of prostate: Secondary | ICD-10-CM | POA: Diagnosis not present

## 2021-03-16 DIAGNOSIS — L600 Ingrowing nail: Secondary | ICD-10-CM | POA: Diagnosis not present

## 2021-03-16 DIAGNOSIS — E782 Mixed hyperlipidemia: Secondary | ICD-10-CM | POA: Diagnosis not present

## 2021-03-16 DIAGNOSIS — B351 Tinea unguium: Secondary | ICD-10-CM | POA: Diagnosis not present

## 2021-03-16 DIAGNOSIS — F341 Dysthymic disorder: Secondary | ICD-10-CM | POA: Diagnosis not present

## 2021-03-16 DIAGNOSIS — I10 Essential (primary) hypertension: Secondary | ICD-10-CM | POA: Diagnosis not present

## 2021-03-16 DIAGNOSIS — Z79899 Other long term (current) drug therapy: Secondary | ICD-10-CM | POA: Diagnosis not present

## 2021-03-16 DIAGNOSIS — K5730 Diverticulosis of large intestine without perforation or abscess without bleeding: Secondary | ICD-10-CM | POA: Diagnosis not present

## 2021-03-16 DIAGNOSIS — Z0001 Encounter for general adult medical examination with abnormal findings: Secondary | ICD-10-CM | POA: Diagnosis not present

## 2021-03-16 DIAGNOSIS — Z23 Encounter for immunization: Secondary | ICD-10-CM | POA: Diagnosis not present

## 2021-03-16 DIAGNOSIS — K621 Rectal polyp: Secondary | ICD-10-CM | POA: Diagnosis not present

## 2021-03-20 NOTE — Progress Notes (Signed)
Electrophysiology Office Note Date: 03/22/2021  ID:  Charles Ellison., DOB 06-29-1951, MRN [id number]  PCP: Josetta Huddle, MD Primary Cardiologist: None Electrophysiologist: [name], MD   CC: Pacemaker follow-up  Charles Ellison. is a 70 y.o. male seen today for [name], MD for routine electrophysiology followup.  Since last being seen in our clinic the patient reports doing very well.  he denies chest pain, palpitations, dyspnea, PND, orthopnea, nausea, vomiting, dizziness, syncope, edema, weight gain, or early satiety.  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 90 tablet 0   No current facility-administered medications for this visit.    Allergies:   Patient has no known allergies.   Social History: Social History   Socioeconomic History   Marital status: Married    Spouse name: Not on file   Number of children: Not on file   Years of education: Not on file   Highest education level: Not on file  Occupational History   Not on file  Tobacco Use   Smoking status: Never   Smokeless tobacco: Never  Vaping Use   Vaping Use: Never used  Substance and Sexual Activity   Alcohol use: Yes    Alcohol/week: 0.0 standard drinks    Comment: 12/27/2016 "might have 1 drink/month"   Drug use: No   Sexual activity: Yes  Other Topics  Concern   Not on file  Social History Narrative   Not on file   Social Determinants of Health   Financial Resource Strain: Not on file  Food Insecurity: Not on file  Transportation Needs: Not on file  Physical Activity: Not on file  Stress: Not on file  Social Connections: Not on file  Intimate Partner Violence: Not on file    Family History: Family History  Problem Relation Age of Onset   Heart disease Mother        died at 65 unclear heart condition   Valvular heart disease Father        died at 40, 2/2 traumatic SDH     Review of Systems: All other systems reviewed and are otherwise negative except as noted above.  Physical Exam: Vitals:   03/22/21 0827  BP: 130/70  Pulse: 61  SpO2: 96%  Weight: 190 lb (86.2 kg)  Height: '5\' 9"'$  (1.753 m)     GEN- The patient is well appearing, alert and oriented x 3 today.   HEENT: normocephalic, atraumatic; sclera clear, conjunctiva pink; hearing intact; oropharynx clear; neck supple  Lungs- Clear to ausculation bilaterally, normal work of breathing.  No wheezes, rales, rhonchi Heart- Regular rate and rhythm, no murmurs, rubs or gallops  GI- soft, non-tender, non-distended, bowel sounds present  Extremities- no clubbing or cyanosis. No edema MS- no significant deformity or atrophy Skin- warm and dry, no rash or lesion; PPM pocket well healed Psych- euthymic  mood, full affect Neuro- strength and sensation are intact  PPM Interrogation- reviewed in detail today,  See PACEART report  EKG:  EKG is ordered today. Personal review of ekg ordered today shows AP/VS at 61 bpm (NSR)   Recent Labs: No results found for requested labs within last 8760 hours.   Wt Readings from Last 3 Encounters:  03/22/21 190 lb (86.2 kg)  01/28/20 197 lb (89.4 kg)  02/06/19 196 lb (88.9 kg)     Other studies Reviewed: Additional studies/ records that were reviewed today include: Previous EP office notes, Previous remote checks, Most recent  labwork.   Assessment and Plan:  1.  Sinus arrest  s/p Medtronic PPM  Normal PPM function See Pace Art report No changes today  2. Hypertension Well controlled on current regimen   3. NSVT Continue toprol 100 mg daily Labs today.    Current medicines are reviewed at length with the patient today.   The patient does not have concerns regarding his medicines.  The following changes were made today:  none  Labs/ tests ordered today include:  Labs requested from PCP No orders of the defined types were placed in this encounter.  Disposition:   Follow up with Dr. Curt Bears in 12 Months    Signed, Annamaria Helling  03/22/2021 8:37 AM  [facility] [address] [place] [address] [phone] (office) [phone] (fax)

## 2021-03-22 ENCOUNTER — Ambulatory Visit: Payer: PPO | Admitting: Student

## 2021-03-22 ENCOUNTER — Other Ambulatory Visit: Payer: Self-pay

## 2021-03-22 ENCOUNTER — Encounter: Payer: Self-pay | Admitting: Student

## 2021-03-22 VITALS — BP 130/70 | HR 61 | Ht 69.0 in | Wt 190.0 lb

## 2021-03-22 DIAGNOSIS — I10 Essential (primary) hypertension: Secondary | ICD-10-CM

## 2021-03-22 DIAGNOSIS — R001 Bradycardia, unspecified: Secondary | ICD-10-CM | POA: Diagnosis not present

## 2021-03-22 DIAGNOSIS — I472 Ventricular tachycardia: Secondary | ICD-10-CM | POA: Diagnosis not present

## 2021-03-22 DIAGNOSIS — I495 Sick sinus syndrome: Secondary | ICD-10-CM

## 2021-03-22 DIAGNOSIS — I4729 Other ventricular tachycardia: Secondary | ICD-10-CM

## 2021-03-22 LAB — CUP PACEART INCLINIC DEVICE CHECK
Battery Remaining Longevity: 106 mo
Battery Voltage: 3 V
Brady Statistic AP VP Percent: 0.19 %
Brady Statistic AP VS Percent: 99.26 %
Brady Statistic AS VP Percent: 0 %
Brady Statistic AS VS Percent: 0.55 %
Brady Statistic RA Percent Paced: 99.46 %
Brady Statistic RV Percent Paced: 0.2 %
Date Time Interrogation Session: 20220809084708
Implantable Lead Implant Date: 20180517
Implantable Lead Implant Date: 20180517
Implantable Lead Location: 753859
Implantable Lead Location: 753860
Implantable Lead Model: 5076
Implantable Lead Model: 5076
Implantable Pulse Generator Implant Date: 20180517
Lead Channel Impedance Value: 304 Ohm
Lead Channel Impedance Value: 323 Ohm
Lead Channel Impedance Value: 380 Ohm
Lead Channel Impedance Value: 475 Ohm
Lead Channel Pacing Threshold Amplitude: 0.625 V
Lead Channel Pacing Threshold Amplitude: 1.25 V
Lead Channel Pacing Threshold Pulse Width: 0.4 ms
Lead Channel Pacing Threshold Pulse Width: 0.4 ms
Lead Channel Sensing Intrinsic Amplitude: 1.625 mV
Lead Channel Sensing Intrinsic Amplitude: 1.625 mV
Lead Channel Sensing Intrinsic Amplitude: 19.25 mV
Lead Channel Sensing Intrinsic Amplitude: 21.875 mV
Lead Channel Setting Pacing Amplitude: 2 V
Lead Channel Setting Pacing Amplitude: 2.75 V
Lead Channel Setting Pacing Pulse Width: 0.4 ms
Lead Channel Setting Sensing Sensitivity: 2 mV

## 2021-03-22 NOTE — Patient Instructions (Signed)
Medication Instructions:  Your physician recommends that you continue on your current medications as directed. Please refer to the Current Medication list given to you today.  *If you need a refill on your cardiac medications before your next appointment, please call your pharmacy*   Lab Work: None If you have labs (blood work) drawn today and your tests are completely normal, you will receive your results only by: [place] (if you have MyChart) OR A paper copy in the mail If you have any lab test that is abnormal or we need to change your treatment, we will call you to review the results.   Follow-Up: At [facility], you and your health needs are our priority.  As part of our continuing mission to provide you with exceptional heart care, we have created designated Provider Care Teams.  These Care Teams include your primary Cardiologist (physician) and Advanced Practice Providers (APPs -  Physician Assistants and Nurse Practitioners) who all work together to provide you with the care you need, when you need it.   Your next appointment:   1 year(s)  The format for your next appointment:   In Person  Provider:   You may see [name], MD or one of the following Advanced Practice Providers on your designated Care Team:   Tommye Standard, [place] Legrand Como "[facility]" [place], [place]

## 2021-04-05 ENCOUNTER — Other Ambulatory Visit: Payer: Self-pay | Admitting: Cardiology

## 2021-04-19 ENCOUNTER — Ambulatory Visit (INDEPENDENT_AMBULATORY_CARE_PROVIDER_SITE_OTHER): Payer: PPO

## 2021-04-19 DIAGNOSIS — I495 Sick sinus syndrome: Secondary | ICD-10-CM

## 2021-04-19 LAB — CUP PACEART REMOTE DEVICE CHECK
Battery Remaining Longevity: 102 mo
Battery Voltage: 3 V
Brady Statistic AP VP Percent: 0.25 %
Brady Statistic AP VS Percent: 99.27 %
Brady Statistic AS VP Percent: 0.02 %
Brady Statistic AS VS Percent: 0.47 %
Brady Statistic RA Percent Paced: 99.6 %
Brady Statistic RV Percent Paced: 0.26 %
Date Time Interrogation Session: 20220905200549
Implantable Lead Implant Date: 20180517
Implantable Lead Implant Date: 20180517
Implantable Lead Location: 753859
Implantable Lead Location: 753860
Implantable Lead Model: 5076
Implantable Lead Model: 5076
Implantable Pulse Generator Implant Date: 20180517
Lead Channel Impedance Value: 285 Ohm
Lead Channel Impedance Value: 304 Ohm
Lead Channel Impedance Value: 361 Ohm
Lead Channel Impedance Value: 418 Ohm
Lead Channel Pacing Threshold Amplitude: 0.5 V
Lead Channel Pacing Threshold Amplitude: 1.375 V
Lead Channel Pacing Threshold Pulse Width: 0.4 ms
Lead Channel Pacing Threshold Pulse Width: 0.4 ms
Lead Channel Sensing Intrinsic Amplitude: 1.25 mV
Lead Channel Sensing Intrinsic Amplitude: 1.25 mV
Lead Channel Sensing Intrinsic Amplitude: 15.625 mV
Lead Channel Sensing Intrinsic Amplitude: 15.625 mV
Lead Channel Setting Pacing Amplitude: 2 V
Lead Channel Setting Pacing Amplitude: 2.75 V
Lead Channel Setting Pacing Pulse Width: 0.4 ms
Lead Channel Setting Sensing Sensitivity: 2 mV

## 2021-04-28 NOTE — Progress Notes (Signed)
Remote pacemaker transmission.   

## 2021-07-19 ENCOUNTER — Ambulatory Visit (INDEPENDENT_AMBULATORY_CARE_PROVIDER_SITE_OTHER): Payer: PPO

## 2021-07-19 DIAGNOSIS — I495 Sick sinus syndrome: Secondary | ICD-10-CM | POA: Diagnosis not present

## 2021-07-19 DIAGNOSIS — H524 Presbyopia: Secondary | ICD-10-CM | POA: Diagnosis not present

## 2021-07-19 DIAGNOSIS — Z961 Presence of intraocular lens: Secondary | ICD-10-CM | POA: Diagnosis not present

## 2021-07-19 LAB — CUP PACEART REMOTE DEVICE CHECK
Battery Remaining Longevity: 101 mo
Battery Voltage: 2.99 V
Brady Statistic AP VP Percent: 0.11 %
Brady Statistic AP VS Percent: 99.1 %
Brady Statistic AS VP Percent: 0.01 %
Brady Statistic AS VS Percent: 0.78 %
Brady Statistic RA Percent Paced: 99.23 %
Brady Statistic RV Percent Paced: 0.12 %
Date Time Interrogation Session: 20221205223814
Implantable Lead Implant Date: 20180517
Implantable Lead Implant Date: 20180517
Implantable Lead Location: 753859
Implantable Lead Location: 753860
Implantable Lead Model: 5076
Implantable Lead Model: 5076
Implantable Pulse Generator Implant Date: 20180517
Lead Channel Impedance Value: 304 Ohm
Lead Channel Impedance Value: 323 Ohm
Lead Channel Impedance Value: 380 Ohm
Lead Channel Impedance Value: 437 Ohm
Lead Channel Pacing Threshold Amplitude: 0.5 V
Lead Channel Pacing Threshold Amplitude: 1.375 V
Lead Channel Pacing Threshold Pulse Width: 0.4 ms
Lead Channel Pacing Threshold Pulse Width: 0.4 ms
Lead Channel Sensing Intrinsic Amplitude: 16.625 mV
Lead Channel Sensing Intrinsic Amplitude: 16.625 mV
Lead Channel Sensing Intrinsic Amplitude: 3.25 mV
Lead Channel Sensing Intrinsic Amplitude: 3.25 mV
Lead Channel Setting Pacing Amplitude: 2 V
Lead Channel Setting Pacing Amplitude: 2.75 V
Lead Channel Setting Pacing Pulse Width: 0.4 ms
Lead Channel Setting Sensing Sensitivity: 2 mV

## 2021-07-28 NOTE — Progress Notes (Signed)
Remote pacemaker transmission.   

## 2021-09-20 DIAGNOSIS — L819 Disorder of pigmentation, unspecified: Secondary | ICD-10-CM | POA: Diagnosis not present

## 2021-09-20 DIAGNOSIS — D225 Melanocytic nevi of trunk: Secondary | ICD-10-CM | POA: Diagnosis not present

## 2021-09-20 DIAGNOSIS — L578 Other skin changes due to chronic exposure to nonionizing radiation: Secondary | ICD-10-CM | POA: Diagnosis not present

## 2021-09-20 DIAGNOSIS — L570 Actinic keratosis: Secondary | ICD-10-CM | POA: Diagnosis not present

## 2021-09-20 DIAGNOSIS — L814 Other melanin hyperpigmentation: Secondary | ICD-10-CM | POA: Diagnosis not present

## 2021-09-20 DIAGNOSIS — L821 Other seborrheic keratosis: Secondary | ICD-10-CM | POA: Diagnosis not present

## 2021-10-18 ENCOUNTER — Ambulatory Visit (INDEPENDENT_AMBULATORY_CARE_PROVIDER_SITE_OTHER): Payer: PPO

## 2021-10-18 DIAGNOSIS — I495 Sick sinus syndrome: Secondary | ICD-10-CM | POA: Diagnosis not present

## 2021-10-18 LAB — CUP PACEART REMOTE DEVICE CHECK
Battery Remaining Longevity: 99 mo
Battery Voltage: 2.99 V
Brady Statistic AP VP Percent: 0.16 %
Brady Statistic AP VS Percent: 99.04 %
Brady Statistic AS VP Percent: 0.01 %
Brady Statistic AS VS Percent: 0.8 %
Brady Statistic RA Percent Paced: 99.17 %
Brady Statistic RV Percent Paced: 0.17 %
Date Time Interrogation Session: 20230306234902
Implantable Lead Implant Date: 20180517
Implantable Lead Implant Date: 20180517
Implantable Lead Location: 753859
Implantable Lead Location: 753860
Implantable Lead Model: 5076
Implantable Lead Model: 5076
Implantable Pulse Generator Implant Date: 20180517
Lead Channel Impedance Value: 304 Ohm
Lead Channel Impedance Value: 323 Ohm
Lead Channel Impedance Value: 380 Ohm
Lead Channel Impedance Value: 437 Ohm
Lead Channel Pacing Threshold Amplitude: 0.5 V
Lead Channel Pacing Threshold Amplitude: 1.25 V
Lead Channel Pacing Threshold Pulse Width: 0.4 ms
Lead Channel Pacing Threshold Pulse Width: 0.4 ms
Lead Channel Sensing Intrinsic Amplitude: 1.375 mV
Lead Channel Sensing Intrinsic Amplitude: 1.375 mV
Lead Channel Sensing Intrinsic Amplitude: 16.25 mV
Lead Channel Sensing Intrinsic Amplitude: 16.25 mV
Lead Channel Setting Pacing Amplitude: 2 V
Lead Channel Setting Pacing Amplitude: 2.5 V
Lead Channel Setting Pacing Pulse Width: 0.4 ms
Lead Channel Setting Sensing Sensitivity: 2 mV

## 2021-11-01 NOTE — Progress Notes (Signed)
Remote pacemaker transmission.   

## 2022-01-17 ENCOUNTER — Ambulatory Visit (INDEPENDENT_AMBULATORY_CARE_PROVIDER_SITE_OTHER): Payer: PPO

## 2022-01-17 DIAGNOSIS — I495 Sick sinus syndrome: Secondary | ICD-10-CM

## 2022-01-17 LAB — CUP PACEART REMOTE DEVICE CHECK
Battery Remaining Longevity: 96 mo
Battery Voltage: 2.99 V
Brady Statistic AP VP Percent: 0.08 %
Brady Statistic AP VS Percent: 99.26 %
Brady Statistic AS VP Percent: 0 %
Brady Statistic AS VS Percent: 0.65 %
Brady Statistic RA Percent Paced: 99.45 %
Brady Statistic RV Percent Paced: 0.09 %
Date Time Interrogation Session: 20230605225225
Implantable Lead Implant Date: 20180517
Implantable Lead Implant Date: 20180517
Implantable Lead Location: 753859
Implantable Lead Location: 753860
Implantable Lead Model: 5076
Implantable Lead Model: 5076
Implantable Pulse Generator Implant Date: 20180517
Lead Channel Impedance Value: 285 Ohm
Lead Channel Impedance Value: 304 Ohm
Lead Channel Impedance Value: 361 Ohm
Lead Channel Impedance Value: 437 Ohm
Lead Channel Pacing Threshold Amplitude: 0.5 V
Lead Channel Pacing Threshold Amplitude: 1.25 V
Lead Channel Pacing Threshold Pulse Width: 0.4 ms
Lead Channel Pacing Threshold Pulse Width: 0.4 ms
Lead Channel Sensing Intrinsic Amplitude: 1.125 mV
Lead Channel Sensing Intrinsic Amplitude: 1.125 mV
Lead Channel Sensing Intrinsic Amplitude: 15.625 mV
Lead Channel Sensing Intrinsic Amplitude: 15.625 mV
Lead Channel Setting Pacing Amplitude: 2 V
Lead Channel Setting Pacing Amplitude: 2.5 V
Lead Channel Setting Pacing Pulse Width: 0.4 ms
Lead Channel Setting Sensing Sensitivity: 2 mV

## 2022-01-31 NOTE — Progress Notes (Signed)
Remote pacemaker transmission.   

## 2022-04-03 DIAGNOSIS — J309 Allergic rhinitis, unspecified: Secondary | ICD-10-CM | POA: Diagnosis not present

## 2022-04-03 DIAGNOSIS — Z1331 Encounter for screening for depression: Secondary | ICD-10-CM | POA: Diagnosis not present

## 2022-04-03 DIAGNOSIS — N529 Male erectile dysfunction, unspecified: Secondary | ICD-10-CM | POA: Diagnosis not present

## 2022-04-03 DIAGNOSIS — K5730 Diverticulosis of large intestine without perforation or abscess without bleeding: Secondary | ICD-10-CM | POA: Diagnosis not present

## 2022-04-03 DIAGNOSIS — K621 Rectal polyp: Secondary | ICD-10-CM | POA: Diagnosis not present

## 2022-04-03 DIAGNOSIS — I10 Essential (primary) hypertension: Secondary | ICD-10-CM | POA: Diagnosis not present

## 2022-04-03 DIAGNOSIS — R635 Abnormal weight gain: Secondary | ICD-10-CM | POA: Diagnosis not present

## 2022-04-03 DIAGNOSIS — E782 Mixed hyperlipidemia: Secondary | ICD-10-CM | POA: Diagnosis not present

## 2022-04-03 DIAGNOSIS — F341 Dysthymic disorder: Secondary | ICD-10-CM | POA: Diagnosis not present

## 2022-04-03 DIAGNOSIS — Z79899 Other long term (current) drug therapy: Secondary | ICD-10-CM | POA: Diagnosis not present

## 2022-04-03 DIAGNOSIS — Z0000 Encounter for general adult medical examination without abnormal findings: Secondary | ICD-10-CM | POA: Diagnosis not present

## 2022-04-03 DIAGNOSIS — Z125 Encounter for screening for malignant neoplasm of prostate: Secondary | ICD-10-CM | POA: Diagnosis not present

## 2022-04-03 DIAGNOSIS — Z950 Presence of cardiac pacemaker: Secondary | ICD-10-CM | POA: Diagnosis not present

## 2022-04-03 DIAGNOSIS — Z23 Encounter for immunization: Secondary | ICD-10-CM | POA: Diagnosis not present

## 2022-04-03 DIAGNOSIS — Z7189 Other specified counseling: Secondary | ICD-10-CM | POA: Diagnosis not present

## 2022-04-18 ENCOUNTER — Ambulatory Visit (INDEPENDENT_AMBULATORY_CARE_PROVIDER_SITE_OTHER): Payer: PPO

## 2022-04-18 DIAGNOSIS — I495 Sick sinus syndrome: Secondary | ICD-10-CM

## 2022-04-25 LAB — CUP PACEART REMOTE DEVICE CHECK
Battery Remaining Longevity: 93 mo
Battery Voltage: 2.99 V
Brady Statistic AP VP Percent: 0.28 %
Brady Statistic AP VS Percent: 99.14 %
Brady Statistic AS VP Percent: 0.02 %
Brady Statistic AS VS Percent: 0.57 %
Brady Statistic RA Percent Paced: 99.2 %
Brady Statistic RV Percent Paced: 0.39 %
Date Time Interrogation Session: 20230905001204
Implantable Lead Implant Date: 20180517
Implantable Lead Implant Date: 20180517
Implantable Lead Location: 753859
Implantable Lead Location: 753860
Implantable Lead Model: 5076
Implantable Lead Model: 5076
Implantable Pulse Generator Implant Date: 20180517
Lead Channel Impedance Value: 304 Ohm
Lead Channel Impedance Value: 323 Ohm
Lead Channel Impedance Value: 380 Ohm
Lead Channel Impedance Value: 418 Ohm
Lead Channel Pacing Threshold Amplitude: 0.5 V
Lead Channel Pacing Threshold Amplitude: 1.25 V
Lead Channel Pacing Threshold Pulse Width: 0.4 ms
Lead Channel Pacing Threshold Pulse Width: 0.4 ms
Lead Channel Sensing Intrinsic Amplitude: 16.5 mV
Lead Channel Sensing Intrinsic Amplitude: 16.5 mV
Lead Channel Sensing Intrinsic Amplitude: 2.75 mV
Lead Channel Sensing Intrinsic Amplitude: 2.75 mV
Lead Channel Setting Pacing Amplitude: 2 V
Lead Channel Setting Pacing Amplitude: 2.5 V
Lead Channel Setting Pacing Pulse Width: 0.4 ms
Lead Channel Setting Sensing Sensitivity: 2 mV

## 2022-05-11 NOTE — Progress Notes (Signed)
Remote pacemaker transmission.   

## 2022-06-30 ENCOUNTER — Other Ambulatory Visit: Payer: Self-pay | Admitting: *Deleted

## 2022-06-30 MED ORDER — METOPROLOL SUCCINATE ER 100 MG PO TB24: ORAL_TABLET | ORAL | 0 refills | Status: AC

## 2022-07-18 ENCOUNTER — Ambulatory Visit (INDEPENDENT_AMBULATORY_CARE_PROVIDER_SITE_OTHER): Payer: PPO

## 2022-07-18 DIAGNOSIS — I495 Sick sinus syndrome: Secondary | ICD-10-CM | POA: Diagnosis not present

## 2022-07-18 LAB — CUP PACEART REMOTE DEVICE CHECK
Battery Remaining Longevity: 92 mo
Battery Voltage: 2.99 V
Brady Statistic AP VP Percent: 0.31 %
Brady Statistic AP VS Percent: 98.99 %
Brady Statistic AS VP Percent: 0.04 %
Brady Statistic AS VS Percent: 0.66 %
Brady Statistic RA Percent Paced: 99.35 %
Brady Statistic RV Percent Paced: 0.34 %
Date Time Interrogation Session: 20231204203033
Implantable Lead Connection Status: 753985
Implantable Lead Connection Status: 753985
Implantable Lead Implant Date: 20180517
Implantable Lead Implant Date: 20180517
Implantable Lead Location: 753859
Implantable Lead Location: 753860
Implantable Lead Model: 5076
Implantable Lead Model: 5076
Implantable Pulse Generator Implant Date: 20180517
Lead Channel Impedance Value: 285 Ohm
Lead Channel Impedance Value: 323 Ohm
Lead Channel Impedance Value: 380 Ohm
Lead Channel Impedance Value: 437 Ohm
Lead Channel Pacing Threshold Amplitude: 0.625 V
Lead Channel Pacing Threshold Amplitude: 1.25 V
Lead Channel Pacing Threshold Pulse Width: 0.4 ms
Lead Channel Pacing Threshold Pulse Width: 0.4 ms
Lead Channel Sensing Intrinsic Amplitude: 1.375 mV
Lead Channel Sensing Intrinsic Amplitude: 1.375 mV
Lead Channel Sensing Intrinsic Amplitude: 16.25 mV
Lead Channel Sensing Intrinsic Amplitude: 16.25 mV
Lead Channel Setting Pacing Amplitude: 2 V
Lead Channel Setting Pacing Amplitude: 2.5 V
Lead Channel Setting Pacing Pulse Width: 0.4 ms
Lead Channel Setting Sensing Sensitivity: 2 mV
Zone Setting Status: 755011
Zone Setting Status: 755011

## 2022-07-20 DIAGNOSIS — H524 Presbyopia: Secondary | ICD-10-CM | POA: Diagnosis not present

## 2022-07-20 DIAGNOSIS — Z961 Presence of intraocular lens: Secondary | ICD-10-CM | POA: Diagnosis not present

## 2022-08-15 DIAGNOSIS — R0981 Nasal congestion: Secondary | ICD-10-CM | POA: Diagnosis not present

## 2022-08-15 DIAGNOSIS — J40 Bronchitis, not specified as acute or chronic: Secondary | ICD-10-CM | POA: Diagnosis not present

## 2022-08-16 NOTE — Progress Notes (Signed)
Remote pacemaker transmission.   

## 2022-09-21 DIAGNOSIS — D225 Melanocytic nevi of trunk: Secondary | ICD-10-CM | POA: Diagnosis not present

## 2022-09-21 DIAGNOSIS — L821 Other seborrheic keratosis: Secondary | ICD-10-CM | POA: Diagnosis not present

## 2022-09-21 DIAGNOSIS — L570 Actinic keratosis: Secondary | ICD-10-CM | POA: Diagnosis not present

## 2022-09-21 DIAGNOSIS — L814 Other melanin hyperpigmentation: Secondary | ICD-10-CM | POA: Diagnosis not present

## 2022-09-21 DIAGNOSIS — L218 Other seborrheic dermatitis: Secondary | ICD-10-CM | POA: Diagnosis not present

## 2022-09-21 DIAGNOSIS — L304 Erythema intertrigo: Secondary | ICD-10-CM | POA: Diagnosis not present

## 2022-10-17 ENCOUNTER — Ambulatory Visit (INDEPENDENT_AMBULATORY_CARE_PROVIDER_SITE_OTHER): Payer: PPO

## 2022-10-17 DIAGNOSIS — I495 Sick sinus syndrome: Secondary | ICD-10-CM | POA: Diagnosis not present

## 2022-10-17 LAB — CUP PACEART REMOTE DEVICE CHECK
Battery Remaining Longevity: 88 mo
Battery Voltage: 2.98 V
Brady Statistic AP VP Percent: 0.12 %
Brady Statistic AP VS Percent: 99.55 %
Brady Statistic AS VP Percent: 0 %
Brady Statistic AS VS Percent: 0.33 %
Brady Statistic RA Percent Paced: 99.65 %
Brady Statistic RV Percent Paced: 0.12 %
Date Time Interrogation Session: 20240304210608
Implantable Lead Connection Status: 753985
Implantable Lead Connection Status: 753985
Implantable Lead Implant Date: 20180517
Implantable Lead Implant Date: 20180517
Implantable Lead Location: 753859
Implantable Lead Location: 753860
Implantable Lead Model: 5076
Implantable Lead Model: 5076
Implantable Pulse Generator Implant Date: 20180517
Lead Channel Impedance Value: 304 Ohm
Lead Channel Impedance Value: 323 Ohm
Lead Channel Impedance Value: 380 Ohm
Lead Channel Impedance Value: 437 Ohm
Lead Channel Pacing Threshold Amplitude: 0.625 V
Lead Channel Pacing Threshold Amplitude: 1.25 V
Lead Channel Pacing Threshold Pulse Width: 0.4 ms
Lead Channel Pacing Threshold Pulse Width: 0.4 ms
Lead Channel Sensing Intrinsic Amplitude: 1.75 mV
Lead Channel Sensing Intrinsic Amplitude: 1.75 mV
Lead Channel Sensing Intrinsic Amplitude: 16.25 mV
Lead Channel Sensing Intrinsic Amplitude: 16.25 mV
Lead Channel Setting Pacing Amplitude: 2 V
Lead Channel Setting Pacing Amplitude: 2.5 V
Lead Channel Setting Pacing Pulse Width: 0.4 ms
Lead Channel Setting Sensing Sensitivity: 2 mV
Zone Setting Status: 755011
Zone Setting Status: 755011

## 2022-11-22 NOTE — Progress Notes (Signed)
Remote pacemaker transmission.   

## 2023-01-16 ENCOUNTER — Ambulatory Visit (INDEPENDENT_AMBULATORY_CARE_PROVIDER_SITE_OTHER): Payer: PPO

## 2023-01-16 DIAGNOSIS — I495 Sick sinus syndrome: Secondary | ICD-10-CM | POA: Diagnosis not present

## 2023-01-16 LAB — CUP PACEART REMOTE DEVICE CHECK
Battery Remaining Longevity: 84 mo
Battery Voltage: 2.98 V
Brady Statistic AP VP Percent: 0.19 %
Brady Statistic AP VS Percent: 99.53 %
Brady Statistic AS VP Percent: 0.01 %
Brady Statistic AS VS Percent: 0.28 %
Brady Statistic RA Percent Paced: 99.37 %
Brady Statistic RV Percent Paced: 0.31 %
Date Time Interrogation Session: 20240603220940
Implantable Lead Connection Status: 753985
Implantable Lead Connection Status: 753985
Implantable Lead Implant Date: 20180517
Implantable Lead Implant Date: 20180517
Implantable Lead Location: 753859
Implantable Lead Location: 753860
Implantable Lead Model: 5076
Implantable Lead Model: 5076
Implantable Pulse Generator Implant Date: 20180517
Lead Channel Impedance Value: 285 Ohm
Lead Channel Impedance Value: 323 Ohm
Lead Channel Impedance Value: 361 Ohm
Lead Channel Impedance Value: 418 Ohm
Lead Channel Pacing Threshold Amplitude: 0.625 V
Lead Channel Pacing Threshold Amplitude: 1.375 V
Lead Channel Pacing Threshold Pulse Width: 0.4 ms
Lead Channel Pacing Threshold Pulse Width: 0.4 ms
Lead Channel Sensing Intrinsic Amplitude: 1.5 mV
Lead Channel Sensing Intrinsic Amplitude: 1.5 mV
Lead Channel Sensing Intrinsic Amplitude: 16.625 mV
Lead Channel Sensing Intrinsic Amplitude: 16.625 mV
Lead Channel Setting Pacing Amplitude: 2 V
Lead Channel Setting Pacing Amplitude: 2.75 V
Lead Channel Setting Pacing Pulse Width: 0.4 ms
Lead Channel Setting Sensing Sensitivity: 2 mV
Zone Setting Status: 755011
Zone Setting Status: 755011

## 2023-02-08 NOTE — Progress Notes (Signed)
Remote pacemaker transmission.   

## 2023-04-17 ENCOUNTER — Ambulatory Visit (INDEPENDENT_AMBULATORY_CARE_PROVIDER_SITE_OTHER): Payer: PPO

## 2023-04-17 DIAGNOSIS — I495 Sick sinus syndrome: Secondary | ICD-10-CM

## 2023-04-17 LAB — CUP PACEART REMOTE DEVICE CHECK
Battery Remaining Longevity: 80 mo
Battery Voltage: 2.98 V
Brady Statistic AP VP Percent: 0.42 %
Brady Statistic AP VS Percent: 99.09 %
Brady Statistic AS VP Percent: 0.02 %
Brady Statistic AS VS Percent: 0.46 %
Brady Statistic RA Percent Paced: 99.54 %
Brady Statistic RV Percent Paced: 0.44 %
Date Time Interrogation Session: 20240903000612
Implantable Lead Connection Status: 753985
Implantable Lead Connection Status: 753985
Implantable Lead Implant Date: 20180517
Implantable Lead Implant Date: 20180517
Implantable Lead Location: 753859
Implantable Lead Location: 753860
Implantable Lead Model: 5076
Implantable Lead Model: 5076
Implantable Pulse Generator Implant Date: 20180517
Lead Channel Impedance Value: 285 Ohm
Lead Channel Impedance Value: 323 Ohm
Lead Channel Impedance Value: 361 Ohm
Lead Channel Impedance Value: 418 Ohm
Lead Channel Pacing Threshold Amplitude: 0.625 V
Lead Channel Pacing Threshold Amplitude: 1.125 V
Lead Channel Pacing Threshold Pulse Width: 0.4 ms
Lead Channel Pacing Threshold Pulse Width: 0.4 ms
Lead Channel Sensing Intrinsic Amplitude: 1.25 mV
Lead Channel Sensing Intrinsic Amplitude: 1.25 mV
Lead Channel Sensing Intrinsic Amplitude: 19.75 mV
Lead Channel Sensing Intrinsic Amplitude: 19.75 mV
Lead Channel Setting Pacing Amplitude: 2 V
Lead Channel Setting Pacing Amplitude: 2.75 V
Lead Channel Setting Pacing Pulse Width: 0.4 ms
Lead Channel Setting Sensing Sensitivity: 2 mV
Zone Setting Status: 755011
Zone Setting Status: 755011

## 2023-04-25 NOTE — Progress Notes (Signed)
Remote pacemaker transmission.   

## 2023-07-17 ENCOUNTER — Ambulatory Visit (INDEPENDENT_AMBULATORY_CARE_PROVIDER_SITE_OTHER): Payer: PPO

## 2023-07-17 DIAGNOSIS — I495 Sick sinus syndrome: Secondary | ICD-10-CM | POA: Diagnosis not present

## 2023-07-18 LAB — CUP PACEART REMOTE DEVICE CHECK
Battery Remaining Longevity: 77 mo
Battery Voltage: 2.98 V
Brady Statistic AP VP Percent: 0.28 %
Brady Statistic AP VS Percent: 99.32 %
Brady Statistic AS VP Percent: 0.02 %
Brady Statistic AS VS Percent: 0.39 %
Brady Statistic RA Percent Paced: 99.52 %
Brady Statistic RV Percent Paced: 0.39 %
Date Time Interrogation Session: 20241202222625
Implantable Lead Connection Status: 753985
Implantable Lead Connection Status: 753985
Implantable Lead Implant Date: 20180517
Implantable Lead Implant Date: 20180517
Implantable Lead Location: 753859
Implantable Lead Location: 753860
Implantable Lead Model: 5076
Implantable Lead Model: 5076
Implantable Pulse Generator Implant Date: 20180517
Lead Channel Impedance Value: 304 Ohm
Lead Channel Impedance Value: 323 Ohm
Lead Channel Impedance Value: 380 Ohm
Lead Channel Impedance Value: 437 Ohm
Lead Channel Pacing Threshold Amplitude: 0.5 V
Lead Channel Pacing Threshold Amplitude: 1.375 V
Lead Channel Pacing Threshold Pulse Width: 0.4 ms
Lead Channel Pacing Threshold Pulse Width: 0.4 ms
Lead Channel Sensing Intrinsic Amplitude: 1.625 mV
Lead Channel Sensing Intrinsic Amplitude: 1.625 mV
Lead Channel Sensing Intrinsic Amplitude: 17.375 mV
Lead Channel Sensing Intrinsic Amplitude: 17.375 mV
Lead Channel Setting Pacing Amplitude: 2 V
Lead Channel Setting Pacing Amplitude: 2.75 V
Lead Channel Setting Pacing Pulse Width: 0.4 ms
Lead Channel Setting Sensing Sensitivity: 2 mV
Zone Setting Status: 755011
Zone Setting Status: 755011

## 2023-08-23 ENCOUNTER — Emergency Department (HOSPITAL_COMMUNITY): Payer: PPO

## 2023-08-23 ENCOUNTER — Inpatient Hospital Stay (HOSPITAL_COMMUNITY)
Admission: EM | Admit: 2023-08-23 | Discharge: 2023-08-25 | DRG: 322 | Disposition: A | Discharge status: Home / Self Care | Payer: PPO | Attending: Family Medicine | Admitting: Family Medicine

## 2023-08-23 DIAGNOSIS — E876 Hypokalemia: Secondary | ICD-10-CM | POA: Diagnosis present

## 2023-08-23 DIAGNOSIS — R079 Chest pain, unspecified: Secondary | ICD-10-CM | POA: Diagnosis not present

## 2023-08-23 DIAGNOSIS — Z8249 Family history of ischemic heart disease and other diseases of the circulatory system: Secondary | ICD-10-CM

## 2023-08-23 DIAGNOSIS — I10 Essential (primary) hypertension: Secondary | ICD-10-CM | POA: Diagnosis present

## 2023-08-23 DIAGNOSIS — I4892 Unspecified atrial flutter: Secondary | ICD-10-CM | POA: Diagnosis present

## 2023-08-23 DIAGNOSIS — I455 Other specified heart block: Secondary | ICD-10-CM | POA: Diagnosis present

## 2023-08-23 DIAGNOSIS — I4729 Other ventricular tachycardia: Secondary | ICD-10-CM

## 2023-08-23 DIAGNOSIS — I214 Non-ST elevation (NSTEMI) myocardial infarction: Principal | ICD-10-CM | POA: Diagnosis present

## 2023-08-23 DIAGNOSIS — Z950 Presence of cardiac pacemaker: Secondary | ICD-10-CM

## 2023-08-23 DIAGNOSIS — I440 Atrioventricular block, first degree: Secondary | ICD-10-CM | POA: Diagnosis present

## 2023-08-23 DIAGNOSIS — I2510 Atherosclerotic heart disease of native coronary artery without angina pectoris: Secondary | ICD-10-CM | POA: Diagnosis present

## 2023-08-23 DIAGNOSIS — I472 Ventricular tachycardia, unspecified: Secondary | ICD-10-CM | POA: Diagnosis present

## 2023-08-23 DIAGNOSIS — R7989 Other specified abnormal findings of blood chemistry: Secondary | ICD-10-CM

## 2023-08-23 DIAGNOSIS — Z79899 Other long term (current) drug therapy: Secondary | ICD-10-CM

## 2023-08-23 DIAGNOSIS — I480 Paroxysmal atrial fibrillation: Secondary | ICD-10-CM | POA: Diagnosis present

## 2023-08-23 LAB — CBC
HCT: 46.8 % (ref 39.0–52.0)
Hemoglobin: 16.2 g/dL (ref 13.0–17.0)
MCH: 31.8 pg (ref 26.0–34.0)
MCHC: 34.6 g/dL (ref 30.0–36.0)
MCV: 91.8 fL (ref 80.0–100.0)
Platelets: 144 10*3/uL — ABNORMAL LOW (ref 150–400)
RBC: 5.1 MIL/uL (ref 4.22–5.81)
RDW: 12.5 % (ref 11.5–15.5)
WBC: 11.8 10*3/uL — ABNORMAL HIGH (ref 4.0–10.5)
nRBC: 0 % (ref 0.0–0.2)

## 2023-08-23 LAB — COMPREHENSIVE METABOLIC PANEL
ALT: 45 U/L — ABNORMAL HIGH (ref 0–44)
AST: 36 U/L (ref 15–41)
Albumin: 4.1 g/dL (ref 3.5–5.0)
Alkaline Phosphatase: 40 U/L (ref 38–126)
Anion gap: 9 (ref 5–15)
BUN: 15 mg/dL (ref 8–23)
CO2: 24 mmol/L (ref 22–32)
Calcium: 9.8 mg/dL (ref 8.9–10.3)
Chloride: 107 mmol/L (ref 98–111)
Creatinine, Ser: 0.96 mg/dL (ref 0.61–1.24)
GFR, Estimated: >60 mL/min (ref >60)
Glucose, Bld: 161 mg/dL — ABNORMAL HIGH (ref 70–99)
Potassium: 3.4 mmol/L — ABNORMAL LOW (ref 3.5–5.1)
Sodium: 140 mmol/L (ref 135–145)
Total Bilirubin: 0.7 mg/dL (ref 0.0–1.2)
Total Protein: 6.9 g/dL (ref 6.5–8.1)

## 2023-08-23 LAB — TROPONIN I (HIGH SENSITIVITY): Troponin I (High Sensitivity): 53 ng/L — ABNORMAL HIGH (ref <18)

## 2023-08-23 LAB — LIPASE, BLOOD: Lipase: 45 U/L (ref 11–51)

## 2023-08-23 MED ORDER — ASPIRIN 81 MG PO CHEW
324.0000 mg | CHEWABLE_TABLET | Freq: Once | ORAL | Status: AC
Start: 2023-08-23 — End: 2023-08-23
  Administered 2023-08-23: 324 mg via ORAL
  Filled 2023-08-23: qty 4

## 2023-08-23 MED ORDER — ONDANSETRON 4 MG PO TBDP
4.0000 mg | ORAL_TABLET | Freq: Once | ORAL | Status: AC
  Administered 2023-08-23: 4 mg via ORAL
  Filled 2023-08-23: qty 1

## 2023-08-23 MED ORDER — POTASSIUM CHLORIDE CRYS ER 20 MEQ PO TBCR
40.0000 meq | EXTENDED_RELEASE_TABLET | Freq: Once | ORAL | Status: AC
  Administered 2023-08-23: 40 meq via ORAL
  Filled 2023-08-23: qty 2

## 2023-08-23 NOTE — ED Provider Notes (Signed)
 [facility] Emergency Department Provider Note MRN:  [id number]  Arrival date & time: 08/24/23     Chief Complaint   Chest Pain  History of Present Illness   Charles Jesus. is a 73 y.o. year-old male presents to the ED with chief complaint of chest pain/pressure in the center of his chest that started about an hour ago.  He states the discomfort started about an hour or so ago.  States that he had several episodes of vomiting since arrival and now he isn't having any chest pain.  Family states that BP at home was very high (190s), which has also resolved now. Denies any recent illness.  Got a COVID shot 2 days ago.  Has a pacemaker placed in 2018.  Denies any SOB, dizziness, or lightheadedness.  States that he has never felt this type of chest pressure before.  States that it felt like something terrible was going to happen.  Symptoms lasted about an hour.  Started to feel better after he vomited in the lobby.  Denies any abdominal pain.  Denies any diarrhea.  States that he has some indigestion and has had some tonight.  Took some pepcid  about any hour before the onset of symptoms.  History provided by patient.   Review of Systems  Pertinent positive and negative review of systems noted in HPI.    Physical Exam   Vitals:   08/23/23 2230 08/23/23 2241  BP: 139/78   Pulse: 60   Resp: 14   Temp:  97.8 F (36.6 C)  SpO2: 98%     CONSTITUTIONAL:  non toxic-appearing, NAD NEURO:  Alert and oriented x 3, CN 3-12 grossly intact EYES:  eyes equal and reactive ENT/NECK:  Supple, no stridor  CARDIO:  normal rate, regular rhythm, appears well-perfused  PULM:  No respiratory distress, CTAB GI/GU:  non-distended,  MSK/SPINE:  No gross deformities, no edema, moves all extremities  SKIN:  no rash, atraumatic   *Additional and/or pertinent findings included in MDM below  Diagnostic and Interventional Summary    EKG Interpretation Date/Time:  Friday  August 24 2023 00:32:55 EST Ventricular Rate:  60 PR Interval:  293 QRS Duration:  90 QT Interval:  403 QTC Calculation: 403 R Axis:   13  Text Interpretation: paced rhythm Inferior infarct, old Probable anterolateral infarct, old   Confirmed by Midge Golas ([id number]) on 08/24/2023 12:36:11 AM       Labs Reviewed  CBC - Abnormal; Notable for the following components:      Result Value   WBC 11.8 (*)    Platelets 144 (*)    All other components within normal limits  COMPREHENSIVE METABOLIC PANEL - Abnormal; Notable for the following components:   Potassium 3.4 (*)    Glucose, Bld 161 (*)    ALT 45 (*)    All other components within normal limits  TROPONIN I (HIGH SENSITIVITY) - Abnormal; Notable for the following components:   Troponin I (High Sensitivity) 53 (*)    All other components within normal limits  TROPONIN I (HIGH SENSITIVITY) - Abnormal; Notable for the following components:   Troponin I (High Sensitivity) 527 (*)    All other components within normal limits  LIPASE, BLOOD  CBC    DG Chest 2 View  Final Result      Medications  heparin  ADULT infusion 100 units/mL (25000 units/250mL) (1,050 Units/hr Intravenous New Bag/Given 08/24/23 0108)  ondansetron  (ZOFRAN -ODT) disintegrating tablet 4 mg (4 mg Oral  Given 08/23/23 2143)  aspirin  chewable tablet 324 mg (324 mg Oral Given 08/23/23 2320)  potassium chloride  SA (KLOR-CON  M) CR tablet 40 mEq (40 mEq Oral Given 08/23/23 2320)  heparin  bolus via infusion 4,000 Units (4,000 Units Intravenous Bolus from Bag 08/24/23 0109)     Procedures  /  Critical Care .Critical Care  Performed by: Vicky Charleston, PA-C Authorized by: Vicky Charleston, PA-C   Critical care provider statement:    Critical care time (minutes):  49   Critical care was necessary to treat or prevent imminent or life-threatening deterioration of the following conditions:  Circulatory failure   Critical care was time spent personally by me on the  following activities:  Development of treatment plan with patient or surrogate, discussions with consultants, evaluation of patient's response to treatment, examination of patient, ordering and review of laboratory studies, ordering and review of radiographic studies, ordering and performing treatments and interventions, pulse oximetry, re-evaluation of patient's condition and review of old charts   ED Course and Medical Decision Making  I have reviewed the triage vital signs, the nursing notes, and pertinent available records from the EMR.  Social Determinants Affecting Complexity of Care: Patient has no clinically significant social determinants affecting this chief complaint..   ED Course:    Medical Decision Making Problems Addressed: Chest pain, unspecified type: acute illness or injury that poses a threat to life or bodily functions Elevated troponin: acute illness or injury that poses a threat to life or bodily functions NSTEMI (non-ST elevated myocardial infarction) [facility]): acute illness or injury that poses a threat to life or bodily functions  Amount and/or Complexity of Data Reviewed Labs: ordered. Radiology: ordered. ECG/medicine tests: ordered.  Risk OTC drugs. Prescription drug management. Decision regarding hospitalization.         Consultants: I consulted with Dr. Floretta, from cardiology, who recommends admission to medicine, trend trops, admit to [facility] so that patient can be seen by cardiology in the AM.  I consulted with Dr. Alfornia, who is appreciated for admitting to [facility].  9964 Repeat trop is 527.  Dr. Floretta called back to recommend starting Heparin , which I had just ordered.  Reviewed recent EKGs, still doesn't meet STEMI criteria, but will likely need cath for NSTEMI.  I sent a secure chat to Dr. Alfornia with this update and she acknowledged the message.   Treatment and Plan: Patient's exam and diagnostic results are concerning for NSTEMI.  Feel  that patient will need admission to the hospital for further treatment and evaluation.  Patient seen by and discussed with attending physician, Dr. Midge, who agrees with plan.  Final Clinical Impressions(s) / ED Diagnoses     ICD-10-CM   1. NSTEMI (non-ST elevated myocardial infarction) (HCC)  I21.4     2. Chest pain, unspecified type  R07.9     3. Elevated troponin  R79.89       ED Discharge Orders     None         Discharge Instructions Discussed with and Provided to Patient:   Discharge Instructions   None      Vicky Charleston, PA-C 08/24/23 CATHRINE Midge Golas, MD 08/24/23 [phone]

## 2023-08-23 NOTE — ED Provider Triage Note (Signed)
 Emergency Medicine Provider Triage Evaluation Note  Charles FORBES Ezzard Mickey. , a 73 y.o. male  was evaluated in triage.  Pt complains of chest pain. Upper chest discomfort with associate nausea and vomiting in the past few hrs.  Report some lightheadedness.  No fever, cough, abd pain, back pain, sob, diaphroresis  Review of Systems  Positive: As above Negative: As above  Physical Exam  BP (!) 197/97 (BP Location: Right Arm)   Pulse 62   Resp 18  Gen:   Awake, no distress   Resp:  Normal effort  MSK:   Moves extremities without difficulty  Other:    Medical Decision Making  Medically screening exam initiated at 9:26 PM.  Appropriate orders placed.  Charles FORBES Ezzard Mickey. was informed that the remainder of the evaluation will be completed by another provider, this initial triage assessment does not replace that evaluation, and the importance of remaining in the ED until their evaluation is complete.     Nivia Colon, PA-C 08/23/23 2129

## 2023-08-23 NOTE — ED Triage Notes (Signed)
Patient vomited while in triage.

## 2023-08-23 NOTE — ED Triage Notes (Signed)
 Patient complaining of chest discomfort and states it feels like tension in his mid chest area. Rates pain 6. Complains of nausea. Patient has a pacemaker. Denies any history of MI.

## 2023-08-24 ENCOUNTER — Encounter (HOSPITAL_COMMUNITY): Payer: Self-pay | Admitting: Internal Medicine

## 2023-08-24 ENCOUNTER — Other Ambulatory Visit: Payer: Self-pay

## 2023-08-24 ENCOUNTER — Encounter (HOSPITAL_COMMUNITY)
Admission: EM | Disposition: A | Discharge status: Home / Self Care | Payer: Self-pay | Source: Home / Self Care | Attending: Family Medicine

## 2023-08-24 ENCOUNTER — Other Ambulatory Visit (HOSPITAL_COMMUNITY): Payer: Self-pay

## 2023-08-24 ENCOUNTER — Telehealth (HOSPITAL_COMMUNITY): Payer: Self-pay | Admitting: Pharmacy Technician

## 2023-08-24 DIAGNOSIS — Z79899 Other long term (current) drug therapy: Secondary | ICD-10-CM | POA: Diagnosis not present

## 2023-08-24 DIAGNOSIS — I2510 Atherosclerotic heart disease of native coronary artery without angina pectoris: Secondary | ICD-10-CM

## 2023-08-24 DIAGNOSIS — I4892 Unspecified atrial flutter: Secondary | ICD-10-CM | POA: Diagnosis present

## 2023-08-24 DIAGNOSIS — R079 Chest pain, unspecified: Secondary | ICD-10-CM | POA: Diagnosis present

## 2023-08-24 DIAGNOSIS — Z950 Presence of cardiac pacemaker: Secondary | ICD-10-CM | POA: Diagnosis not present

## 2023-08-24 DIAGNOSIS — I440 Atrioventricular block, first degree: Secondary | ICD-10-CM | POA: Diagnosis present

## 2023-08-24 DIAGNOSIS — I495 Sick sinus syndrome: Secondary | ICD-10-CM

## 2023-08-24 DIAGNOSIS — E876 Hypokalemia: Secondary | ICD-10-CM | POA: Diagnosis present

## 2023-08-24 DIAGNOSIS — I10 Essential (primary) hypertension: Secondary | ICD-10-CM

## 2023-08-24 DIAGNOSIS — I4729 Other ventricular tachycardia: Secondary | ICD-10-CM

## 2023-08-24 DIAGNOSIS — I455 Other specified heart block: Secondary | ICD-10-CM | POA: Diagnosis present

## 2023-08-24 DIAGNOSIS — I480 Paroxysmal atrial fibrillation: Secondary | ICD-10-CM | POA: Diagnosis present

## 2023-08-24 DIAGNOSIS — I214 Non-ST elevation (NSTEMI) myocardial infarction: Secondary | ICD-10-CM | POA: Diagnosis present

## 2023-08-24 DIAGNOSIS — I472 Ventricular tachycardia, unspecified: Secondary | ICD-10-CM | POA: Diagnosis present

## 2023-08-24 DIAGNOSIS — Z8249 Family history of ischemic heart disease and other diseases of the circulatory system: Secondary | ICD-10-CM | POA: Diagnosis not present

## 2023-08-24 HISTORY — PX: CORONARY STENT INTERVENTION: CATH118234

## 2023-08-24 HISTORY — PX: LEFT HEART CATH AND CORONARY ANGIOGRAPHY: CATH118249

## 2023-08-24 LAB — BASIC METABOLIC PANEL
Anion gap: 5 (ref 5–15)
BUN: 14 mg/dL (ref 8–23)
CO2: 25 mmol/L (ref 22–32)
Calcium: 9 mg/dL (ref 8.9–10.3)
Chloride: 107 mmol/L (ref 98–111)
Creatinine, Ser: 0.91 mg/dL (ref 0.61–1.24)
GFR, Estimated: >60 mL/min (ref >60)
Glucose, Bld: 122 mg/dL — ABNORMAL HIGH (ref 70–99)
Potassium: 4 mmol/L (ref 3.5–5.1)
Sodium: 137 mmol/L (ref 135–145)

## 2023-08-24 LAB — CBC
HCT: 45.7 % (ref 39.0–52.0)
HCT: 47.6 % (ref 39.0–52.0)
Hemoglobin: 15 g/dL (ref 13.0–17.0)
Hemoglobin: 15.9 g/dL (ref 13.0–17.0)
MCH: 30.7 pg (ref 26.0–34.0)
MCH: 30.6 pg (ref 26.0–34.0)
MCHC: 32.8 g/dL (ref 30.0–36.0)
MCHC: 33.4 g/dL (ref 30.0–36.0)
MCV: 93.6 fL (ref 80.0–100.0)
MCV: 91.5 fL (ref 80.0–100.0)
Platelets: 135 10*3/uL — ABNORMAL LOW (ref 150–400)
Platelets: 129 10*3/uL — ABNORMAL LOW (ref 150–400)
RBC: 4.88 MIL/uL (ref 4.22–5.81)
RBC: 5.2 MIL/uL (ref 4.22–5.81)
RDW: 12.5 % (ref 11.5–15.5)
RDW: 12.5 % (ref 11.5–15.5)
WBC: 9.1 10*3/uL (ref 4.0–10.5)
WBC: 12.9 10*3/uL — ABNORMAL HIGH (ref 4.0–10.5)
nRBC: 0 % (ref 0.0–0.2)
nRBC: 0 % (ref 0.0–0.2)

## 2023-08-24 LAB — POCT ACTIVATED CLOTTING TIME: Activated Clotting Time: 389 s

## 2023-08-24 LAB — MAGNESIUM: Magnesium: 2 mg/dL (ref 1.7–2.4)

## 2023-08-24 LAB — TROPONIN I (HIGH SENSITIVITY)
Troponin I (High Sensitivity): 527 ng/L (ref <18)
Troponin I (High Sensitivity): 2379 ng/L (ref <18)
Troponin I (High Sensitivity): 1912 ng/L (ref <18)

## 2023-08-24 LAB — HIV ANTIBODY (ROUTINE TESTING W REFLEX): HIV Screen 4th Generation wRfx: NONREACTIVE

## 2023-08-24 LAB — CREATININE, SERUM
Creatinine, Ser: 1.25 mg/dL — ABNORMAL HIGH (ref 0.61–1.24)
GFR, Estimated: >60 mL/min (ref >60)

## 2023-08-24 LAB — HEPARIN LEVEL (UNFRACTIONATED): Heparin Unfractionated: 1 [IU]/mL — ABNORMAL HIGH (ref 0.30–0.70)

## 2023-08-24 SURGERY — LEFT HEART CATH AND CORONARY ANGIOGRAPHY
Anesthesia: LOCAL

## 2023-08-24 MED ORDER — HEPARIN SODIUM (PORCINE) 1000 UNIT/ML IJ SOLN
INTRAMUSCULAR | Status: DC | PRN
  Administered 2023-08-24 (×2): 4500 [IU] via INTRAVENOUS

## 2023-08-24 MED ORDER — METOPROLOL SUCCINATE ER 50 MG PO TB24
50.0000 mg | ORAL_TABLET | Freq: Every day | ORAL | Status: DC
  Administered 2023-08-25: 50 mg via ORAL
  Filled 2023-08-24: qty 1

## 2023-08-24 MED ORDER — ACETAMINOPHEN 650 MG RE SUPP: 650.0000 mg | Freq: Four times a day (QID) | RECTAL | Status: DC | PRN

## 2023-08-24 MED ORDER — VERAPAMIL HCL 2.5 MG/ML IV SOLN
INTRAVENOUS | Status: DC | PRN
  Administered 2023-08-24: 10 mL via INTRA_ARTERIAL

## 2023-08-24 MED ORDER — TICAGRELOR 90 MG PO TABS
ORAL_TABLET | ORAL | Status: AC
Start: 2023-08-24 — End: ?
  Filled 2023-08-24: qty 2

## 2023-08-24 MED ORDER — METOPROLOL SUCCINATE ER 50 MG PO TB24
50.0000 mg | ORAL_TABLET | Freq: Once | ORAL | Status: AC
  Administered 2023-08-24: 50 mg via ORAL

## 2023-08-24 MED ORDER — ENOXAPARIN SODIUM 40 MG/0.4ML IJ SOSY
40.0000 mg | PREFILLED_SYRINGE | INTRAMUSCULAR | Status: DC
  Administered 2023-08-25: 40 mg via SUBCUTANEOUS
  Filled 2023-08-24: qty 0.4

## 2023-08-24 MED ORDER — FAMOTIDINE 20 MG PO TABS
20.0000 mg | ORAL_TABLET | Freq: Two times a day (BID) | ORAL | Status: DC | PRN
  Administered 2023-08-24 – 2023-08-25 (×2): 20 mg via ORAL
  Filled 2023-08-24 (×2): qty 1

## 2023-08-24 MED ORDER — HEPARIN SODIUM (PORCINE) 1000 UNIT/ML IJ SOLN
INTRAMUSCULAR | Status: AC
Start: 2023-08-24 — End: ?
  Filled 2023-08-24: qty 10

## 2023-08-24 MED ORDER — SODIUM CHLORIDE 0.9% FLUSH
3.0000 mL | INTRAVENOUS | Status: DC | PRN
  Administered 2023-08-24: 3 mL via INTRAVENOUS

## 2023-08-24 MED ORDER — HEPARIN (PORCINE) 25000 UT/250ML-% IV SOLN
1050.0000 [IU]/h | INTRAVENOUS | Status: DC
  Administered 2023-08-24: 1050 [IU]/h via INTRAVENOUS
  Filled 2023-08-24: qty 250

## 2023-08-24 MED ORDER — MIDAZOLAM HCL 2 MG/2ML IJ SOLN
INTRAMUSCULAR | Status: AC
  Filled 2023-08-24: qty 2

## 2023-08-24 MED ORDER — TICAGRELOR 90 MG PO TABS
90.0000 mg | ORAL_TABLET | Freq: Two times a day (BID) | ORAL | Status: DC
  Administered 2023-08-24 – 2023-08-25 (×2): 90 mg via ORAL
  Filled 2023-08-24 (×2): qty 1

## 2023-08-24 MED ORDER — ACETAMINOPHEN 325 MG PO TABS
650.0000 mg | ORAL_TABLET | Freq: Four times a day (QID) | ORAL | Status: DC | PRN
  Administered 2023-08-24 (×2): 650 mg via ORAL
  Filled 2023-08-24: qty 2

## 2023-08-24 MED ORDER — NITROGLYCERIN 1 MG/10 ML FOR IR/CATH LAB
INTRA_ARTERIAL | Status: DC | PRN
  Administered 2023-08-24 (×2): 200 ug via INTRACORONARY

## 2023-08-24 MED ORDER — SODIUM CHLORIDE 0.9 % WEIGHT BASED INFUSION: 3.0000 mL/kg/h | INTRAVENOUS | Status: DC

## 2023-08-24 MED ORDER — ASPIRIN 81 MG PO CHEW
81.0000 mg | CHEWABLE_TABLET | ORAL | Status: AC
  Administered 2023-08-24: 81 mg via ORAL

## 2023-08-24 MED ORDER — LIDOCAINE HCL (PF) 1 % IJ SOLN
INTRAMUSCULAR | Status: AC
  Filled 2023-08-24: qty 30

## 2023-08-24 MED ORDER — FENTANYL CITRATE (PF) 100 MCG/2ML IJ SOLN
INTRAMUSCULAR | Status: AC
  Filled 2023-08-24: qty 2

## 2023-08-24 MED ORDER — SODIUM CHLORIDE 0.9% FLUSH
3.0000 mL | Freq: Two times a day (BID) | INTRAVENOUS | Status: DC
  Administered 2023-08-24 – 2023-08-25 (×2): 3 mL via INTRAVENOUS

## 2023-08-24 MED ORDER — LIDOCAINE HCL (PF) 1 % IJ SOLN
INTRAMUSCULAR | Status: DC | PRN
  Administered 2023-08-24: 2 mL

## 2023-08-24 MED ORDER — ATORVASTATIN CALCIUM 80 MG PO TABS
80.0000 mg | ORAL_TABLET | Freq: Every day | ORAL | 11 refills | Status: DC
  Filled 2023-08-24: qty 30, 30d supply, fill #0

## 2023-08-24 MED ORDER — FENTANYL CITRATE (PF) 100 MCG/2ML IJ SOLN
INTRAMUSCULAR | Status: DC | PRN
  Administered 2023-08-24: 25 ug via INTRAVENOUS

## 2023-08-24 MED ORDER — TICAGRELOR 90 MG PO TABS
ORAL_TABLET | ORAL | Status: DC | PRN
  Administered 2023-08-24: 180 mg via ORAL

## 2023-08-24 MED ORDER — SODIUM CHLORIDE 0.9 % IV SOLN: 250.0000 mL | INTRAVENOUS | Status: DC | PRN

## 2023-08-24 MED ORDER — HEPARIN BOLUS VIA INFUSION
4000.0000 [IU] | Freq: Once | INTRAVENOUS | Status: AC
  Administered 2023-08-24: 4000 [IU] via INTRAVENOUS
  Filled 2023-08-24: qty 4000

## 2023-08-24 MED ORDER — PROCHLORPERAZINE EDISYLATE 10 MG/2ML IJ SOLN
5.0000 mg | Freq: Four times a day (QID) | INTRAMUSCULAR | Status: DC | PRN
  Administered 2023-08-24: 5 mg via INTRAVENOUS
  Filled 2023-08-24: qty 2

## 2023-08-24 MED ORDER — NITROGLYCERIN 1 MG/10 ML FOR IR/CATH LAB
INTRA_ARTERIAL | Status: AC
  Filled 2023-08-24: qty 10

## 2023-08-24 MED ORDER — HEPARIN (PORCINE) IN NACL 1000-0.9 UT/500ML-% IV SOLN
INTRAVENOUS | Status: DC | PRN
  Administered 2023-08-24: 500 mL
  Administered 2023-08-24: 1000 mL

## 2023-08-24 MED ORDER — IOHEXOL 350 MG/ML SOLN
INTRAVENOUS | Status: DC | PRN
  Administered 2023-08-24: 115 mL

## 2023-08-24 MED ORDER — ASPIRIN 81 MG PO CHEW
CHEWABLE_TABLET | ORAL | Status: AC
  Filled 2023-08-24: qty 1

## 2023-08-24 MED ORDER — VERAPAMIL HCL 2.5 MG/ML IV SOLN
INTRAVENOUS | Status: AC
Start: 2023-08-24 — End: ?
  Filled 2023-08-24: qty 2

## 2023-08-24 MED ORDER — ACETAMINOPHEN 325 MG PO TABS
ORAL_TABLET | ORAL | Status: AC
  Filled 2023-08-24: qty 2

## 2023-08-24 MED ORDER — HYDRALAZINE HCL 20 MG/ML IJ SOLN: 10.0000 mg | INTRAMUSCULAR | Status: AC | PRN

## 2023-08-24 MED ORDER — TICAGRELOR 90 MG PO TABS
90.0000 mg | ORAL_TABLET | Freq: Two times a day (BID) | ORAL | 11 refills | Status: DC
  Filled 2023-08-24: qty 60, 30d supply, fill #0

## 2023-08-24 MED ORDER — SODIUM CHLORIDE 0.9 % WEIGHT BASED INFUSION
1.0000 mL/kg/h | INTRAVENOUS | Status: DC
  Administered 2023-08-24: 1 mL/kg/h via INTRAVENOUS

## 2023-08-24 MED ORDER — MIDAZOLAM HCL 2 MG/2ML IJ SOLN
INTRAMUSCULAR | Status: DC | PRN
  Administered 2023-08-24: 1 mg via INTRAVENOUS

## 2023-08-24 MED ORDER — METOPROLOL SUCCINATE ER 100 MG PO TB24
100.0000 mg | ORAL_TABLET | Freq: Every day | ORAL | Status: DC
Start: 2023-08-24 — End: 2023-08-24
  Filled 2023-08-24: qty 1

## 2023-08-24 SURGICAL SUPPLY — 18 items
BALLN EMERGE MR 2.5X15 (BALLOONS) ×1
BALLN ~~LOC~~ EMERGE MR 3.0X8 (BALLOONS) ×1
BALLOON EMERGE MR 2.5X15 (BALLOONS) IMPLANT
BALLOON ~~LOC~~ EMERGE MR 3.0X8 (BALLOONS) IMPLANT
CATH 5FR JL3.5 JR4 ANG PIG MP (CATHETERS) IMPLANT
CATH VISTA GUIDE 6FR XBLD 3.5 (CATHETERS) IMPLANT
DEVICE RAD COMP TR BAND LRG (VASCULAR PRODUCTS) IMPLANT
GLIDESHEATH SLEND SS 6F .021 (SHEATH) IMPLANT
GUIDEWIRE INQWIRE 1.5J.035X260 (WIRE) IMPLANT
INQWIRE 1.5J .035X260CM (WIRE) ×1
KIT ENCORE 26 ADVANTAGE (KITS) IMPLANT
PACK CARDIAC CATHETERIZATION (CUSTOM PROCEDURE TRAY) ×1 IMPLANT
SET ATX-X65L (MISCELLANEOUS) IMPLANT
SHEATH PROBE COVER 6X72 (BAG) IMPLANT
STENT SYNERGY XD 2.75X32 (Permanent Stent) IMPLANT
SYNERGY XD 2.75X32 (Permanent Stent) ×1 IMPLANT
WIRE ASAHI PROWATER 180CM (WIRE) IMPLANT
WIRE RUNTHROUGH .014X180CM (WIRE) IMPLANT

## 2023-08-24 NOTE — TOC CM/SW Note (Signed)
 Transition of Care [facility]) - Inpatient Brief Assessment   Patient Details  Name: Charles Ellison. MRN: [id number] Date of Birth: Aug 29, 1950  [facility] CM/SW Contact:    Sudie Erminio Deems, RN Phone Number: 08/24/2023, 2:24 PM   Clinical Narrative: Patient presented for chest pain- post LHC. Plan for Bril[employer] - benefits check completed. [employer]  30 day free card provided to the patient. PTA patient was from home with spouse. Spouse at the bedside and no home needs identified during the time of visit. Case Manager will continue to follow for additional transition of care needs as the patient progresses.     Transition of Care Asessment: Insurance and Status: Insurance coverage has been reviewed Patient has primary care physician: Yes Home environment has been reviewed: reviewed Prior level of function:: independent Prior/Current Home Services: No current home services Social Drivers of Health Review: SDOH reviewed no interventions necessary Readmission risk has been reviewed: Yes Transition of care needs: no transition of care needs at this time

## 2023-08-24 NOTE — Consult Note (Signed)
 Cardiology Consultation   Patient ID: Charles Ellison. MRN: [id number]; DOB: June 11, 1951  Admit date: 08/23/2023 Date of Consult: 08/24/2023  PCP:  Charlott Dorn LABOR, MD   [facility] Providers Cardiologist:  Gordy Bergamo, MD  Electrophysiologist:  Will Gladis Norton, MD  {  Patient Profile:   Charles Ellison. is a 73 y.o. male with a hx of sinus arrest s/p PPM, HTN, NSVT, and PAF/flutter not on [facility] who is being seen 08/24/2023 for the evaluation of chest pain at the request of Dr. Cheryle.  History of Present Illness:   [name] was admitted 2018 after syncopal spell found to have junctional rhythm and bradycardia.  He is now s/p [employer] PPM.  Unfortunately he was lost to cardiology follow-up since 2020 when he last saw Dr. Norton in clinic.  Last PPM interrogation 07/2023 showed 41 A-fib/flutter events with the longest duration 30 minutes, rates were controlled.  Of note, he is not on oral anticoagulation.  He presented to [facility] ED 08/23/2023 with substernal chest pressure.  He was hypertensive at home with systolic BP 190s.  Of note he got his COVID vaccination 2 days prior.  HS troponin 53 --> 527 --> 2379 EKG with old inferior infarct, appears sinus rhythm with first-degree heart block.   Cardiology consulted for NSTEMI. Overnight fellow called who concluded EKG did not meet STEMI criteria and recommended trending CE and starting heparin  gtt.   His wife and friend are present at bedside.  At baseline he is active around the house and also with a 75-year-old that they keep after school.  He and his wife walk 4 miles regularly.  Last evening, he was rolling the trash cans from the road and felt onset of chest pressure at around 2100.  Chest pressure did not radiate, but was associated with shortness of breath, nausea, and emesis x 1.  His wife states that he reported an overwhelming sense of doom and felt something bad was about to happen.  He agreed to go to the ER.   On arrival to the ER, he vomited once which seemed to relieve his chest pain.  He is currently chest pain-free.  He did not receive nitroglycerin .  Hypokalemia has been corrected.  He was started on a heparin  drip.   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 Authorizing Provider  Collagen-Vitamin C-Biotin (COLLAGEN PO) Take 1 capsule by mouth daily with breakfast.   Yes [provider]  Glucosamine-Chondroit-Vit C-Mn (GLUCOSAMINE-CHONDROITIN) TABS Take 1 tablet by mouth daily with breakfast.   Yes [provider]  metoprolol  succinate (TOPROL -XL) 100 MG 24 hr tablet TAKE 1 TABLET(100 MG) BY MOUTH DAILY Patient taking differently: Take 100 mg by mouth daily. 06/30/22  Yes Camnitz, Soyla Gladis, MD  Omega-3 Fatty Acids (FISH OIL PO) Take 1 capsule by mouth daily with breakfast.   Yes [provider]  PEPCID  COMPLETE [phone] MG chewable tablet Chew 1 tablet by mouth 2 (two) times daily as needed (  for heartburn/indigestion).   Yes [provider]    Inpatient Medications: Scheduled Meds:  metoprolol  succinate  100 mg Oral Daily   Continuous Infusions:  heparin  1,050 Units/hr (08/24/23 0108)   PRN Meds: acetaminophen  **OR** acetaminophen   Allergies:   No Known Allergies  Social History:   Social History   Socioeconomic History   Marital status: Married    Spouse name: Not on file   Number of children: Not on file   Years of education: Not on file   Highest education  level: Not on file  Occupational History   Not on file  Tobacco Use   Smoking status: Never   Smokeless tobacco: Never  Vaping Use   Vaping status: Never Used  Substance and Sexual Activity   Alcohol use: Yes    Alcohol/week: 0.0 standard drinks of alcohol    Comment: 12/27/2016 might have 1 drink/month   Drug use: No   Sexual activity: Yes  Other Topics Concern   Not on file  Social History Narrative   Not on file   Social Drivers of Health   Financial Resource Strain: Not on file  Food Insecurity: Not on file  Transportation Needs: Not on file  Physical Activity: Not on file  Stress: Not on file  Social Connections: Not on file  Intimate Partner Violence: Not on file    Family History:    Family History  Problem Relation Age of Onset   Heart disease Mother        died at 64 unclear heart condition   Valvular heart disease Father        died at 56, 2/2 traumatic SDH     ROS:  Please see the history of present illness.   All other ROS reviewed and negative.     Physical Exam/Data:   Vitals:   08/24/23 0245 08/24/23 0400 08/24/23 0651 08/24/23 0700  BP: (!) 144/84 (!) 150/98  (!) 153/101  Pulse: 61 60  60  Resp: 15 17  13   Temp: 98 F (36.7 C)  98 F (36.7 C)   TempSrc:   Oral   SpO2: 94% 94%  96%   No intake or output data in the 24 hours ending 08/24/23 0857    03/22/2021    8:27 AM 01/28/2020   10:27 AM 02/06/2019    3:36 PM  Last 3 Weights  Weight (lbs) 190 lb 197 lb 196 lb  Weight (kg) 86.183 kg 89.359 kg 88.905 kg     There is no height or weight on file to calculate BMI.  General:  Well nourished, well developed, in no acute distress HEENT: normal Neck: no JVD Vascular: No carotid bruits; Distal pulses 2+ bilaterally Cardiac:  normal S1, S2; RRR; no murmur  Lungs:  clear to auscultation bilaterally, no wheezing, rhonchi or rales  Abd: soft, nontender, no hepatomegaly  Ext: no edema Musculoskeletal:  No deformities, BUE and BLE strength  normal and equal Skin: warm and dry  Neuro:  CNs 2-12 intact, no focal abnormalities noted Psych:  Normal affect   EKG:  The EKG was personally reviewed and demonstrates:  A-paced rhythm with ST elevation in inferior leads, subsequent EKG with inferior Q waves Telemetry:  Telemetry was personally reviewed and demonstrates:  SR with HR 60s, NSVT  Relevant CV Studies:  none  Laboratory Data:  High Sensitivity Troponin:   Recent Labs  Lab 08/23/23 2130 08/23/23 2322 08/24/23 0300 08/24/23 0704  TROPONINIHS 53* 527* 2,379* 1,912*  Chemistry Recent Labs  Lab 08/23/23 2130 08/24/23 0300  NA 140 137  K 3.4* 4.0  CL 107 107  CO2 24 25  GLUCOSE 161* 122*  BUN 15 14  CREATININE 0.96 0.91  CALCIUM  9.8 9.0  MG  --  2.0  GFRNONAA >60 >60  ANIONGAP 9 5    Recent Labs  Lab 08/23/23 2130  PROT 6.9  ALBUMIN 4.1  AST 36  ALT 45*  ALKPHOS 40  BILITOT 0.7   Lipids No results for input(s): CHOL, TRIG, HDL, LABVLDL, LDLCALC, CHOLHDL in the last 168 hours.  Hematology Recent Labs  Lab 08/23/23 2130 08/24/23 0300  WBC 11.8* 9.1  RBC 5.10 4.88  HGB 16.2 15.0  HCT 46.8 45.7  MCV 91.8 93.6  MCH 31.8 30.7  MCHC 34.6 32.8  RDW 12.5 12.5  PLT 144* 135*   Thyroid  No results for input(s): TSH, FREET4 in the last 168 hours.  BNPNo results for input(s): BNP, PROBNP in the last 168 hours.  DDimer No results for input(s): DDIMER in the last 168 hours.   Radiology/Studies:  DG Chest 2 View Result Date: 08/23/2023 CLINICAL DATA:  Chest pain EXAM: CHEST - 2 VIEW COMPARISON:  Chest x-ray 12/29/2016 FINDINGS: Left-sided pacemaker is present. The heart size and mediastinal contours are within normal limits. Both lungs are clear. The visualized skeletal structures are unremarkable. IMPRESSION: No active cardiopulmonary disease. Electronically Signed   By: Greig Pique M.D.   On: 08/23/2023 21:43     Assessment and Plan:   Inferior MI - hs troponin 53 -->  527 --> 2379 - initial EKG with A-paced rhythm and ST elevation inferior leads, HR 60, subsequent EKG with inferior Q waves - description of symptoms concerning for angina, relieved with vomiting in the ER - he is amenable for heart catheterization   PAF/flutter - noted on PPM interrogation  - will ask EP to weigh in on device check and need for [facility] - if OAC needed, will likely shorten the course of ASA if PCI performed - very low burden, ultimately may not need OAC   NSVT - Mg 2.0, K 4.0 - on 100 mg toprol  at home - asymptomatic - no recent syncope   Hypertension - BP better controlled - follow for additional agents   Informed Consent   Shared Decision Making/Informed Consent The risks [stroke (1 in 1000), death (1 in 1000), kidney failure [usually temporary] (1 in 500), bleeding (1 in 200), allergic reaction [possibly serious] (1 in 200)], benefits (diagnostic support and management of coronary artery disease) and alternatives of a cardiac catheterization were discussed in detail with Mr. Biskup and he is willing to proceed.      Risk Assessment/Risk Scores:     TIMI Risk Score for Unstable Angina or Non-ST Elevation MI:   The patient's TIMI risk score is 2, which indicates a 8% risk of all cause mortality, new or recurrent myocardial infarction or need for urgent revascularization in the next 14 days.    CHA2DS2-VASc Score = 3   This indicates a 3.2% annual risk of stroke. The patient's score is based upon: CHF History: 0 HTN History: 1 Diabetes History: 0 Stroke History: 0 Vascular Disease History: 1 Age Score: 1 Gender Score: 0       For questions or updates, please contact [facility] Please consult [email or web address] for contact info under    Signed, Jon Nat Hails, PA  08/24/2023 8:57 AM

## 2023-08-24 NOTE — H&P (View-Only) (Signed)
 Cardiology Consultation   Patient ID: Charles Ellison. MRN: [id number]; DOB: June 11, 1951  Admit date: 08/23/2023 Date of Consult: 08/24/2023  PCP:  Charlott Dorn LABOR, MD   [facility] Providers Cardiologist:  Gordy Bergamo, MD  Electrophysiologist:  Will Ellison Norton, MD  {  Patient Profile:   Charles Utz. is a 73 y.o. male with a hx of sinus arrest s/p PPM, HTN, NSVT, and PAF/flutter not on [facility] who is being seen 08/24/2023 for the evaluation of chest pain at the request of Dr. Cheryle.  History of Present Illness:   Charles Ellison was admitted 2018 after syncopal spell found to have junctional rhythm and bradycardia.  He is now s/p [employer] PPM.  Unfortunately he was lost to cardiology follow-up since 2020 when he last saw Dr. Norton in clinic.  Last PPM interrogation 07/2023 showed 41 A-fib/flutter events with the longest duration 30 minutes, rates were controlled.  Of note, he is not on oral anticoagulation.  He presented to [facility] ED 08/23/2023 with substernal chest pressure.  He was hypertensive at home with systolic BP 190s.  Of note he got his COVID vaccination 2 days prior.  HS troponin 53 --> 527 --> 2379 EKG with old inferior infarct, appears sinus rhythm with first-degree heart block.   Cardiology consulted for NSTEMI. Overnight fellow called who concluded EKG did not meet STEMI criteria and recommended trending CE and starting heparin  gtt.   His wife and friend are present at bedside.  At baseline he is active around the house and also with a 75-year-old that they keep after school.  He and his wife walk 4 miles regularly.  Last evening, he was rolling the trash cans from the road and felt onset of chest pressure at around 2100.  Chest pressure did not radiate, but was associated with shortness of breath, nausea, and emesis x 1.  His wife states that he reported an overwhelming sense of doom and felt something bad was about to happen.  He agreed to go to the ER.   On arrival to the ER, he vomited once which seemed to relieve his chest pain.  He is currently chest pain-free.  He did not receive nitroglycerin .  Hypokalemia has been corrected.  He was started on a heparin  drip.   Past Medical History:  Diagnosis Date   Essential hypertension    Sinus arrest 12/27/2016   thelbert 12/27/2016    Past Surgical History:  Procedure Laterality Date   PACEMAKER IMPLANT N/A 12/28/2016   Procedure: Pacemaker Implant;  Surgeon: Charles Soyla Gladis, MD;  Location: [facility] INVASIVE CV LAB;  Service: Cardiovascular;  Laterality: N/A;   SHOULDER ARTHROSCOPY Left ~ 2005   for calcium  deposits; impinged shoulder     Home Medications:  Prior to Admission medications   Medication Sig Start Date End Date Taking? Authorizing Provider  Collagen-Vitamin C-Biotin (COLLAGEN PO) Take 1 capsule by mouth daily with breakfast.   Yes [provider]  Glucosamine-Chondroit-Vit C-Mn (GLUCOSAMINE-CHONDROITIN) TABS Take 1 tablet by mouth daily with breakfast.   Yes [provider]  metoprolol  succinate (TOPROL -XL) 100 MG 24 hr tablet TAKE 1 TABLET(100 MG) BY MOUTH DAILY Patient taking differently: Take 100 mg by mouth daily. 06/30/22  Yes Camnitz, Soyla Gladis, MD  Omega-3 Fatty Acids (FISH OIL PO) Take 1 capsule by mouth daily with breakfast.   Yes [provider]  PEPCID  COMPLETE [phone] MG chewable tablet Chew 1 tablet by mouth 2 (two) times daily as needed (  for heartburn/indigestion).   Yes [provider]    Inpatient Medications: Scheduled Meds:  metoprolol  succinate  100 mg Oral Daily   Continuous Infusions:  heparin  1,050 Units/hr (08/24/23 0108)   PRN Meds: acetaminophen  **OR** acetaminophen   Allergies:   No Known Allergies  Social History:   Social History   Socioeconomic History   Marital status: Married    Spouse name: Not on file   Number of children: Not on file   Years of education: Not on file   Highest education  level: Not on file  Occupational History   Not on file  Tobacco Use   Smoking status: Never   Smokeless tobacco: Never  Vaping Use   Vaping status: Never Used  Substance and Sexual Activity   Alcohol use: Yes    Alcohol/week: 0.0 standard drinks of alcohol    Comment: 12/27/2016 might have 1 drink/month   Drug use: No   Sexual activity: Yes  Other Topics Concern   Not on file  Social History Narrative   Not on file   Social Drivers of Health   Financial Resource Strain: Not on file  Food Insecurity: Not on file  Transportation Needs: Not on file  Physical Activity: Not on file  Stress: Not on file  Social Connections: Not on file  Intimate Partner Violence: Not on file    Family History:    Family History  Problem Relation Age of Onset   Heart disease Mother        died at 64 unclear heart condition   Valvular heart disease Father        died at 56, 2/2 traumatic SDH     ROS:  Please see the history of present illness.   All other ROS reviewed and negative.     Physical Exam/Data:   Vitals:   08/24/23 0245 08/24/23 0400 08/24/23 0651 08/24/23 0700  BP: (!) 144/84 (!) 150/98  (!) 153/101  Pulse: 61 60  60  Resp: 15 17  13   Temp: 98 F (36.7 C)  98 F (36.7 C)   TempSrc:   Oral   SpO2: 94% 94%  96%   No intake or output data in the 24 hours ending 08/24/23 0857    03/22/2021    8:27 AM 01/28/2020   10:27 AM 02/06/2019    3:36 PM  Last 3 Weights  Weight (lbs) 190 lb 197 lb 196 lb  Weight (kg) 86.183 kg 89.359 kg 88.905 kg     There is no height or weight on file to calculate BMI.  General:  Well nourished, well developed, in no acute distress HEENT: normal Neck: no JVD Vascular: No carotid bruits; Distal pulses 2+ bilaterally Cardiac:  normal S1, S2; RRR; no murmur  Lungs:  clear to auscultation bilaterally, no wheezing, rhonchi or rales  Abd: soft, nontender, no hepatomegaly  Ext: no edema Musculoskeletal:  No deformities, BUE and BLE strength  normal and equal Skin: warm and dry  Neuro:  CNs 2-12 intact, no focal abnormalities noted Psych:  Normal affect   EKG:  The EKG was personally reviewed and demonstrates:  A-paced rhythm with ST elevation in inferior leads, subsequent EKG with inferior Q waves Telemetry:  Telemetry was personally reviewed and demonstrates:  SR with HR 60s, NSVT  Relevant CV Studies:  none  Laboratory Data:  High Sensitivity Troponin:   Recent Labs  Lab 08/23/23 2130 08/23/23 2322 08/24/23 0300 08/24/23 0704  TROPONINIHS 53* 527* 2,379* 1,912*  Chemistry Recent Labs  Lab 08/23/23 2130 08/24/23 0300  NA 140 137  K 3.4* 4.0  CL 107 107  CO2 24 25  GLUCOSE 161* 122*  BUN 15 14  CREATININE 0.96 0.91  CALCIUM  9.8 9.0  MG  --  2.0  GFRNONAA >60 >60  ANIONGAP 9 5    Recent Labs  Lab 08/23/23 2130  PROT 6.9  ALBUMIN 4.1  AST 36  ALT 45*  ALKPHOS 40  BILITOT 0.7   Lipids No results for input(s): CHOL, TRIG, HDL, LABVLDL, LDLCALC, CHOLHDL in the last 168 hours.  Hematology Recent Labs  Lab 08/23/23 2130 08/24/23 0300  WBC 11.8* 9.1  RBC 5.10 4.88  HGB 16.2 15.0  HCT 46.8 45.7  MCV 91.8 93.6  MCH 31.8 30.7  MCHC 34.6 32.8  RDW 12.5 12.5  PLT 144* 135*   Thyroid  No results for input(s): TSH, FREET4 in the last 168 hours.  BNPNo results for input(s): BNP, PROBNP in the last 168 hours.  DDimer No results for input(s): DDIMER in the last 168 hours.   Radiology/Studies:  DG Chest 2 View Result Date: 08/23/2023 CLINICAL DATA:  Chest pain EXAM: CHEST - 2 VIEW COMPARISON:  Chest x-ray 12/29/2016 FINDINGS: Left-sided pacemaker is present. The heart size and mediastinal contours are within normal limits. Both lungs are clear. The visualized skeletal structures are unremarkable. IMPRESSION: No active cardiopulmonary disease. Electronically Signed   By: Greig Pique M.D.   On: 08/23/2023 21:43     Assessment and Plan:   Inferior MI - hs troponin 53 -->  527 --> 2379 - initial EKG with A-paced rhythm and ST elevation inferior leads, HR 60, subsequent EKG with inferior Q waves - description of symptoms concerning for angina, relieved with vomiting in the ER - he is amenable for heart catheterization   PAF/flutter - noted on PPM interrogation  - will ask EP to weigh in on device check and need for [facility] - if OAC needed, will likely shorten the course of ASA if PCI performed - very low burden, ultimately may not need OAC   NSVT - Mg 2.0, K 4.0 - on 100 mg toprol  at home - asymptomatic - no recent syncope   Hypertension - BP better controlled - follow for additional agents   Informed Consent   Shared Decision Making/Informed Consent The risks [stroke (1 in 1000), death (1 in 1000), kidney failure [usually temporary] (1 in 500), bleeding (1 in 200), allergic reaction [possibly serious] (1 in 200)], benefits (diagnostic support and management of coronary artery disease) and alternatives of a cardiac catheterization were discussed in detail with Mr. Biskup and he is willing to proceed.      Risk Assessment/Risk Scores:     TIMI Risk Score for Unstable Angina or Non-ST Elevation MI:   The patient's TIMI risk score is 2, which indicates a 8% risk of all cause mortality, new or recurrent myocardial infarction or need for urgent revascularization in the next 14 days.    CHA2DS2-VASc Score = 3   This indicates a 3.2% annual risk of stroke. The patient's score is based upon: CHF History: 0 HTN History: 1 Diabetes History: 0 Stroke History: 0 Vascular Disease History: 1 Age Score: 1 Gender Score: 0       For questions or updates, please contact [facility] Please consult [email or web address] for contact info under    Signed, Jon Nat Hails, PA  08/24/2023 8:57 AM

## 2023-08-24 NOTE — Progress Notes (Signed)
 Patient admitted earlier this morning for chest pain/non-STEMI.  Patient seen and examined at bedside and plan of care discussed with him.  I have reviewed patient's medical records including this morning's H&P, current vitals, labs, medications myself.  At bedtime troponin is 2379 but patient denies any chest pain.  Cardiology evaluation pending.  Currently on heparin  drip.  Awaiting transfer to [facility] per cardiology recommendations.  Continue telemetry monitoring.

## 2023-08-24 NOTE — Progress Notes (Signed)
 Patient arrived from [place]. Denies any chest pain or distress at this time. VSS. IV patent. Consent obtained for procedure. Call bell near. Will continue to assess patient for needs.Charles Ellison

## 2023-08-24 NOTE — Interval H&P Note (Signed)
 History and Physical Interval Note:  08/24/2023 10:57 AM  Charles Ellison.  has presented today for surgery, with the diagnosis of nstemi.  The various methods of treatment have been discussed with the patient and family. After consideration of risks, benefits and other options for treatment, the patient has consented to  Procedure(s): LEFT HEART CATH AND CORONARY ANGIOGRAPHY (N/A) as a surgical intervention.  The patient's history has been reviewed, patient examined, no change in status, stable for surgery.  I have reviewed the patient's chart and labs.  Questions were answered to the patient's satisfaction.    Cath Lab Visit (complete for each Cath Lab visit)  Clinical Evaluation Leading to the Procedure:   ACS: Yes.    Non-ACS:    Anginal Classification: CCS IV  Anti-ischemic medical therapy: No Therapy  Non-Invasive Test Results: No non-invasive testing performed  Prior CABG: No previous CABG        Charles Ellison 08/24/2023 10:57 AM

## 2023-08-24 NOTE — H&P (Signed)
 History and Physical    Charles Ellison. FMW:[id number] DOB: 18-May-1951 DOA: 08/23/2023  PCP: Charlott Dorn LABOR, MD  Patient coming from: Home  Chief Complaint: Chest pain  HPI: Charles Lech. is a 73 y.o. male with medical history significant of sinus arrest status post PPM, hypertension, NSVT presented to ED with complaints of acute onset chest pain/pressure, nausea, and vomiting.  Blood pressure initially elevated with SBP in the 190s on arrival to the ED but subsequently improved.  Remainder of vital signs stable.  Labs notable for WBC count 11.8, platelet count 144k, potassium 3.4, glucose 161, creatinine 0.9, no significant elevation of LFTs, lipase normal, troponin 53> 527. EKGs showing new slight ST elevations inferolaterally.  Chest x-ray showing no active cardiopulmonary disease.  EDP consulted cardiology who felt that the patient's EKGs did not meet STEMI criteria.  Since troponin trended up, cardiology recommended starting IV heparin  for NSTEMI and admission to [facility] for cardiac catheterization in the morning.  Patient was given aspirin  and started on IV heparin  in the ED.  He also received Zofran  and oral potassium.  TRH called to admit.  Patient states this evening he was taking his garbage can out when all of a sudden he experienced substernal chest pressure and felt nauseous.  He had 1 episode of vomiting when he arrived to the ED.  His blood pressure was in the 190s at home.  Chest pressure resolved in about 45 minutes.  Denies any chest pain or pressure at this time.  He was not having any GI symptoms prior to this event tonight.  No longer nauseous or vomiting.  No abdominal pain.  Review of Systems:  Review of Systems  All other systems reviewed and are negative.   Past Medical History:  Diagnosis Date   Essential hypertension    Sinus arrest 12/27/2016   thelbert 12/27/2016    Past Surgical History:  Procedure Laterality Date   PACEMAKER IMPLANT  N/A 12/28/2016   Procedure: Pacemaker Implant;  Surgeon: Inocencio Soyla Lunger, MD;  Location: [facility] INV[facility] CV LAB;  Service: Cardiovascular;  Laterality: N/A;   SHOULDER ARTHROSCOPY Left ~ 2005   for calcium  deposits; impinged shoulder     reports that he has never smoked. He has never used smokeless tobacco. He reports current alcohol use. He reports that he does not use drugs.  No Known Allergies  Family History  Problem Relation Age of Onset   Heart disease Mother        died at 71 unclear heart condition   Valvular heart disease Father        died at 81, 2/2 traumatic SDH    Prior to Admission medications   Medication Sig Start Date End Date Taking? Authorizing Provider  Collagen-Vitamin C-Biotin (COLLAGEN PO) Take 1 capsule by mouth daily with breakfast.   Yes [provider]  Glucosamine-Chondroit-Vit C-Mn (GLUCOSAMINE-CHONDROITIN) TABS Take 1 tablet by mouth daily with breakfast.   Yes [provider]  metoprolol  succinate (TOPROL -XL) 100 MG 24 hr tablet TAKE 1 TABLET(100 MG) BY MOUTH DAILY Patient taking differently: Take 100 mg by mouth daily. 06/30/22  Yes Camnitz, Soyla Lunger, MD  Omega-3 Fatty Acids (FISH OIL PO) Take 1 capsule by mouth daily with breakfast.   Yes [provider]  PEPCID  COMPLETE 10-800-165 MG chewable tablet Chew 1 tablet by mouth 2 (two) times daily as needed (for heartburn/indigestion).   Yes [provider]    Physical Exam: Vitals:  08/23/23 2124 08/23/23 2230 08/23/23 2241  BP: (!) 197/97 139/78   Pulse: 62 60   Resp: 18 14   Temp:   97.8 F (36.6 C)  TempSrc:   Oral  SpO2:  98%     Physical Exam Vitals reviewed.  Constitutional:      General: He is not in acute distress. HENT:     Head: Normocephalic and atraumatic.  Eyes:     Extraocular Movements: Extraocular movements intact.  Cardiovascular:     Rate and Rhythm: Normal rate and regular rhythm.     Pulses: Normal pulses.  Pulmonary:      Effort: Pulmonary effort is normal. No respiratory distress.     Breath sounds: Normal breath sounds. No wheezing or rales.  Abdominal:     General: Bowel sounds are normal. There is no distension.     Palpations: Abdomen is soft.     Tenderness: There is no abdominal tenderness. There is no guarding.  Musculoskeletal:     Cervical back: Normal range of motion.     Right lower leg: No edema.     Left lower leg: No edema.  Skin:    General: Skin is warm and dry.  Neurological:     General: No focal deficit present.     Mental Status: He is alert and oriented to person, place, and time.     Labs on Admission: I have personally reviewed following labs and imaging studies  CBC: Recent Labs  Lab 08/23/23 2130  WBC 11.8*  HGB 16.2  HCT 46.8  MCV 91.8  PLT 144*   Basic Metabolic Panel: Recent Labs  Lab 08/23/23 2130  NA 140  K 3.4*  CL 107  CO2 24  GLUCOSE 161*  BUN 15  CREATININE 0.96  CALCIUM  9.8   GFR: CrCl cannot be calculated (Unknown ideal weight.). Liver Function Tests: Recent Labs  Lab 08/23/23 2130  AST 36  ALT 45*  ALKPHOS 40  BILITOT 0.7  PROT 6.9  ALBUMIN 4.1   Recent Labs  Lab 08/23/23 2130  LIPASE 45   No results for input(s): AMMONIA in the last 168 hours. Coagulation Profile: No results for input(s): INR, PROTIME in the last 168 hours. Cardiac Enzymes: No results for input(s): CKTOTAL, CKMB, CKMBINDEX, TROPONINI in the last 168 hours. BNP (last 3 results) No results for input(s): PROBNP in the last 8760 hours. HbA1C: No results for input(s): HGBA1C in the last 72 hours. CBG: No results for input(s): GLUCAP in the last 168 hours. Lipid Profile: No results for input(s): CHOL, HDL, LDLCALC, TRIG, CHOLHDL, LDLDIRECT in the last 72 hours. Thyroid  Function Tests: No results for input(s): TSH, T4TOTAL, FREET4, T3FREE, THYROIDAB in the last 72 hours. Anemia Panel: No results for input(s):  VITAMINB12, FOLATE, FERRITIN, TIBC, IRON, RETICCTPCT in the last 72 hours. Urine analysis:    Component Value Date/Time   COLORURINE YELLOW 12/27/2016 1552   APPEARANCEUR CLEAR 12/27/2016 1552   LABSPEC 1.020 12/27/2016 1552   PHURINE 5.0 12/27/2016 1552   GLUCOSEU NEGATIVE 12/27/2016 1552   HGBUR NEGATIVE 12/27/2016 1552   BILIRUBINUR NEGATIVE 12/27/2016 1552   KETONESUR NEGATIVE 12/27/2016 1552   PROTEINUR NEGATIVE 12/27/2016 1552   NITRITE NEGATIVE 12/27/2016 1552   LEUKOCYTESUR NEGATIVE 12/27/2016 1552    Radiological Exams on Admission: DG Chest 2 View Result Date: 08/23/2023 CLINICAL DATA:  Chest pain EXAM: CHEST - 2 VIEW COMPARISON:  Chest x-ray 12/29/2016 FINDINGS: Left-sided pacemaker is present. The heart size and mediastinal contours are  within normal limits. Both lungs are clear. The visualized skeletal structures are unremarkable. IMPRESSION: No active cardiopulmonary disease. Electronically Signed   By: Greig Pique M.D.   On: 08/23/2023 21:43    EKG: Independently reviewed.  Sinus rhythm with first-degree AV block, and new slight ST elevations inferolaterally.  Assessment and Plan  NSTEMI Troponin 53> 527. EKGs showing new slight ST elevations inferolaterally. EDP consulted cardiology who felt that the patient's EKGs did not meet STEMI criteria.  Since troponin trended up, cardiology recommended starting IV heparin  for NSTEMI and admission to [facility] for cardiac catheterization in the morning.  Patient is currently chest pain-free and resting comfortably. -Keep n.p.o. after midnight -Continue IV heparin  -Patient was given aspirin  in the ED. -Trend troponin  Mild hypokalemia Monitor potassium and magnesium levels, continue to replace as needed.  History of sinus arrest status post PPM Cardiac monitoring.  Hypertension SBP currently in the 140s.  Continue metoprolol .  History of NSVT Continue metoprolol .  DVT prophylaxis: IV heparin   gtt Code Status: Full Code (discussed with the patient) Family Communication: Wife at bedside. Consults called: Cardiology Level of care: Progressive Care Unit Admission status: It is my clinical opinion that admission to INPATIENT is reasonable and necessary because of the expectation that this patient will require hospital care that crosses at least 2 midnights to treat this condition based on the medical complexity of the problems presented.  Given the aforementioned information, the predictability of an adverse outcome is felt to be significant.  Editha Ram MD [facility]  If 7PM-7AM, please contact night-coverage [email or web address]  08/24/2023, 1:13 AM

## 2023-08-24 NOTE — ED Notes (Signed)
 Date and time results received: 08/24/23 12:29 AM  (use smartphrase .now to insert current time)  Test: Troponin Critical Value: 527  Name of Provider Notified: wickline  Orders Received? Or Actions Taken?: Orders Received - See Orders for details

## 2023-08-24 NOTE — Plan of Care (Signed)

## 2023-08-24 NOTE — Progress Notes (Signed)
 PHARMACY - ANTICOAGULATION CONSULT NOTE  Pharmacy Consult for heparin  Indication: chest pain/ACS  No Known Allergies  Patient Measurements:   Heparin  Dosing Weight: 86.2kg  Vital Signs: Temp: 97.8 F (36.6 C) (01/09 2241) Temp Source: Oral (01/09 2241) BP: 139/78 (01/09 2230) Pulse Rate: 60 (01/09 2230)  Labs: Recent Labs    08/23/23 2130 08/23/23 2322  HGB 16.2  --   HCT 46.8  --   PLT 144*  --   CREATININE 0.96  --   TROPONINIHS 53* 527*    CrCl cannot be calculated (Unknown ideal weight.).   Medical History: Past Medical History:  Diagnosis Date   Essential hypertension    Sinus arrest 12/27/2016   thelbert 12/27/2016     Assessment: 73 yo male presents with chest discomfort.  Pharmacy to dose heparin  for ACS/STEMI, no prior AC noted  Hgb 16.2, plts 144, trop 527, Scr 0.96  Goal of Therapy:  Heparin  level 0.3-0.7 units/ml Monitor platelets by anticoagulation protocol: Yes   Plan:  Heparin  bolus 4000 units x 1 Start heparin  drip at 1050 units/hr Heparin  level in 8 hours Daily CBC   Leeroy Mace RPh 08/24/2023, 12:53 AM

## 2023-08-24 NOTE — Progress Notes (Signed)
 Reviewed EKG with Dr. [place]. Does not meet STEMI criteria, change diagnosis to NSTEMI.  Yates Decamp, MD, [facility] 08/24/2023, 11:08 AM [facility] [address] [place], [place] [address] Phone: [phone]. Fax:  [phone]

## 2023-08-24 NOTE — ED Notes (Signed)
 Wife is requesting to be notified when patient is transported to [facility]

## 2023-08-24 NOTE — Telephone Encounter (Signed)
 Patient Product/process development scientist completed.    The patient is insured through [employer]/ [employer]. Patient has Medicare and is not eligible for a copay card, but may be able to apply for patient assistance or Medicare RX Payment Plan (Patient Must reach out to their plan, if eligible for payment plan), if available.    Ran test claim for Brilinta 90 mg and the current 30 day co-pay is $47.00.   This test claim was processed through [facility]- copay amounts may vary at other pharmacies due to pharmacy/plan contracts, or as the patient moves through the different stages of their insurance plan.     Roland Earl, CPHT Pharmacy Technician III Certified Patient Advocate [facility] Patient Advocate Team Direct Number: [phone]  Fax: [phone]

## 2023-08-24 NOTE — ED Notes (Signed)
 Critical value troponin    sent to Dr Hanley Ben

## 2023-08-25 ENCOUNTER — Inpatient Hospital Stay (HOSPITAL_COMMUNITY): Payer: PPO

## 2023-08-25 DIAGNOSIS — I214 Non-ST elevation (NSTEMI) myocardial infarction: Secondary | ICD-10-CM

## 2023-08-25 LAB — BASIC METABOLIC PANEL
Anion gap: 8 (ref 5–15)
BUN: 12 mg/dL (ref 8–23)
CO2: 23 mmol/L (ref 22–32)
Calcium: 8.9 mg/dL (ref 8.9–10.3)
Chloride: 107 mmol/L (ref 98–111)
Creatinine, Ser: 1.22 mg/dL (ref 0.61–1.24)
GFR, Estimated: >60 mL/min (ref >60)
Glucose, Bld: 115 mg/dL — ABNORMAL HIGH (ref 70–99)
Potassium: 3.7 mmol/L (ref 3.5–5.1)
Sodium: 138 mmol/L (ref 135–145)

## 2023-08-25 LAB — CBC
HCT: 44 % (ref 39.0–52.0)
Hemoglobin: 14.8 g/dL (ref 13.0–17.0)
MCH: 30.6 pg (ref 26.0–34.0)
MCHC: 33.6 g/dL (ref 30.0–36.0)
MCV: 90.9 fL (ref 80.0–100.0)
Platelets: 124 10*3/uL — ABNORMAL LOW (ref 150–400)
RBC: 4.84 MIL/uL (ref 4.22–5.81)
RDW: 12.4 % (ref 11.5–15.5)
WBC: 10.2 10*3/uL (ref 4.0–10.5)
nRBC: 0 % (ref 0.0–0.2)

## 2023-08-25 LAB — MAGNESIUM: Magnesium: 1.9 mg/dL (ref 1.7–2.4)

## 2023-08-25 MED ORDER — PERFLUTREN LIPID MICROSPHERE
1.0000 mL | INTRAVENOUS | Status: AC | PRN
  Administered 2023-08-25: 2 mL via INTRAVENOUS

## 2023-08-25 MED ORDER — ASPIRIN 81 MG PO TBEC: 81.0000 mg | DELAYED_RELEASE_TABLET | Freq: Every day | ORAL | Status: AC

## 2023-08-25 MED ORDER — ATORVASTATIN CALCIUM 80 MG PO TABS
80.0000 mg | ORAL_TABLET | Freq: Once | ORAL | Status: AC
  Administered 2023-08-25: 80 mg via ORAL
  Filled 2023-08-25: qty 1

## 2023-08-25 NOTE — Discharge Summary (Signed)
 Physician Discharge Summary   Patient: Charles Ellison. MRN: [id number] DOB: 1950/10/02  Admit date:     08/23/2023  Discharge date: 08/25/23  Discharge Physician: Lonni SHAUNNA Dalton   PCP: Charlott Dorn LABOR, MD     Recommendations at discharge:  Follow up with Cardiology Dr. Ladona in 2 weeks for NSTEMI Follow up with Electrophysiology Dr. Inocencio as directed for pacemaker     Discharge Diagnoses: Principal Problem:   Non-ST elevation (NSTEMI) myocardial infarction [facility]) Active Problems:   Sinus arrest   Essential hypertension   Hypokalemia   NSVT (nonsustained ventricular tachycardia) (HCC)   Paroxysmal atrial fibrillation     Hospital Course: Charles Ellison is a 73 y.o. M with hx sinus arrest s/p PPM, HTN who presented with new angina, elevated troponins.    NSTEMI Admitted and started on heparin .  Cardiology consulted and recommended LHC.  Underwent LHC and DES to mid-LAD.    Post-LHC echo showed EF >40%.  Continued on home metoprolol .  Lipitor, aspirin  and Brilinta  added.  Referral to Cardiac Rehab sent.   Paroxysmal atrial fibrillation CHA2DS2-Vasc 2 (age, HTN) but AF burden reportedly extremely low.  Cardiology recommending to defer [facility] at this time. - Follow up with cardiology            The [place]  Controlled Substances Registry was reviewed for this patient prior to discharge.  Consultants: Cardiology  Procedures performed:  Left heart catheterization and stent Echocardiogram   Disposition: Home Diet recommendation:  Discharge Diet Orders (From admission, onward)     Start     Ordered   08/25/23 0000  Diet - low sodium heart healthy        08/25/23 1007             DISCHARGE MEDICATION: Allergies as of 08/25/2023   No Known Allergies      Medication List     TAKE these medications    aspirin  EC 81 MG tablet Take 1 tablet (81 mg total) by mouth daily. Swallow whole.   atorvastatin  80 MG tablet Commonly known  as: Lipitor Take 1 tablet (80 mg total) by mouth daily.   Brilinta  90 MG Tabs tablet Generic drug: ticagrelor  Take 1 tablet (90 mg total) by mouth 2 (two) times daily.   COLLAGEN PO Take 1 capsule by mouth daily with breakfast.   FISH OIL PO Take 1 capsule by mouth daily with breakfast.   Glucosamine-Chondroitin Tabs Take 1 tablet by mouth daily with breakfast.   metoprolol  succinate 100 MG 24 hr tablet Commonly known as: TOPROL -XL TAKE 1 TABLET(100 MG) BY MOUTH DAILY What changed:  how much to take how to take this when to take this additional instructions   Pepcid  Complete 10-800-165 MG chewable tablet Generic drug: famotidine -calcium  carbonate-magnesium hydroxide Chew 1 tablet by mouth 2 (two) times daily as needed (for heartburn/indigestion).        Follow-up Information     Charlott Dorn LABOR, MD. Schedule an appointment as soon as possible for a visit in 1 week(s).   Specialty: Internal Medicine Contact information: [address] [place] [address] [phone]         Percy Rosaline HERO, NP Follow up on 09/03/2023.   Specialty: Cardiology Why: Post hospital follow up with NP for Dr. Ladona 10:55 AM. Please arrive 15 mins early. Contact information: [address] [place] [address] [phone]  Discharge Instructions     Amb Referral to Cardiac Rehabilitation   Complete by: As directed    Diagnosis:  Coronary Stents NSTEMI     After initial evaluation and assessments completed: Virtual Based Care may be provided alone or in conjunction with Phase 2 Cardiac Rehab based on patient barriers.: Yes   Intensive Cardiac Rehabilitation (ICR) [facility] location only OR Traditional Cardiac Rehabilitation (TCR) *If criteria for ICR are not met will enroll in TCR [facility] only): Yes   Diet - low sodium heart healthy   Complete by: As directed    Discharge instructions   Complete by: As directed    **IMPORTANT  DISCHARGE INSTRUCTIONS**   From Dr. Jonel: You were admitted for a myocardial infarction (heart attack)  You had a stent placed in your LAD artery  You should take aspirin  and Brilinta  to protect the stent for minimum 12 months  You should start the cholesterol medicine atorvastatin /Lipitor 80 mg nightly  Go see your primary doctor in 1 week  Go see the new heart doctor Dr. Ladona in 2 weeks  Follow up with Dr. Carolyn office as well   Increase activity slowly   Complete by: As directed        Discharge Exam: Filed Weights   08/24/23 0906  Weight: 88.5 kg    General: Pt is alert, awake, not in acute distress Cardiovascular: RRR, nl S1-S2, no murmurs appreciated.   No LE edema.   Respiratory: Normal respiratory rate and rhythm.  CTAB without rales or wheezes. Abdominal: Abdomen soft and non-tender.  No distension or HSM.   Neuro/Psych: Strength symmetric in upper and lower extremities.  Judgment and insight appear normal.   Condition at discharge: good  The results of significant diagnostics from this hospitalization (including imaging, microbiology, ancillary and laboratory) are listed below for reference.   Imaging Studies: ECHOCARDIOGRAM COMPLETE Result Date: 08/25/2023    ECHOCARDIOGRAM REPORT   Patient Name:   Charles Ellison. Date of Exam: 08/25/2023 Medical Rec #:  [id number]           Height:       70.0 in Accession #:    [id number]          Weight:       195.0 lb Date of Birth:  March 22, 1951          BSA:          2.065 m Patient Age:    72 years            BP:           135/81 mmHg Patient Gender: M                   HR:           62 bpm. Exam Location:  Inpatient Procedure: 2D Echo, Cardiac Doppler, Color Doppler and Intracardiac            Opacification Agent Indications:    122-I22.9 Subsequent ST elevation (STEM) and non-ST elevation                 (NSTEMI) myocardial infarction  History:        Patient has no prior history of Echocardiogram examinations.                  Abnormal ECG and Pacemaker, COPD, Arrythmias:NSVT. Sinus arrest                 and Bradycardia, Signs/Symptoms:Chest  Pain; Risk                 Factors:Hypertension.  Sonographer:    Ellouise Mose RDCS Referring Phys: [id number] Trenesha Alcaide P Orvetta Danielski IMPRESSIONS  1. There is apical hypokenesis. . Left ventricular ejection fraction, by estimation, is 55 to 60%. The left ventricle has normal function. The left ventricle demonstrates regional wall motion abnormalities (see scoring diagram/findings for description).  Left ventricular diastolic parameters are consistent with Grade I diastolic dysfunction (impaired relaxation).  2. Right ventricular systolic function is mildly reduced. The right ventricular size is moderately enlarged.  3. The mitral valve is grossly normal. Mild mitral valve regurgitation. No evidence of mitral stenosis.  4. The aortic valve is tricuspid. Aortic valve regurgitation is not visualized. No aortic stenosis is present. Comparison(s): No prior Echocardiogram. Conclusion(s)/Recommendation(s): No left ventricular mural or apical thrombus/thrombi. FINDINGS  Left Ventricle: There is apical hypokenesis. Left ventricular ejection fraction, by estimation, is 55 to 60%. The left ventricle has normal function. The left ventricle demonstrates regional wall motion abnormalities. Definity  contrast agent was given IV to delineate the left ventricular endocardial borders. The left ventricular internal cavity size was normal in size. There is borderline left ventricular hypertrophy. Left ventricular diastolic parameters are consistent with Grade I diastolic dysfunction (impaired relaxation). Right Ventricle: The right ventricular size is moderately enlarged. No increase in right ventricular wall thickness. Right ventricular systolic function is mildly reduced. Left Atrium: Left atrial size was normal in size. Right Atrium: Right atrial size was normal in size. Pericardium: There is no evidence of  pericardial effusion. Presence of epicardial fat layer. Mitral Valve: The mitral valve is grossly normal. Mild mitral valve regurgitation. No evidence of mitral valve stenosis. Tricuspid Valve: The tricuspid valve is normal in structure. Tricuspid valve regurgitation is not demonstrated. No evidence of tricuspid stenosis. Aortic Valve: The aortic valve is tricuspid. Aortic valve regurgitation is not visualized. No aortic stenosis is present. Pulmonic Valve: The pulmonic valve was normal in structure. Pulmonic valve regurgitation is not visualized. No evidence of pulmonic stenosis. Aorta: The aortic root and ascending aorta are structurally normal, with no evidence of dilitation. IAS/Shunts: No atrial level shunt detected by color flow Doppler. Additional Comments: A device lead is visualized.  LEFT VENTRICLE PLAX 2D LVIDd:         4.30 cm     Diastology LVIDs:         2.80 cm     LV e' medial:    7.29 cm/s LV PW:         1.40 cm     LV E/e' medial:  9.3 LV IVS:        1.20 cm     LV e' lateral:   10.80 cm/s LVOT diam:     2.20 cm     LV E/e' lateral: 6.3 LV SV:         83 LV SV Index:   40 LVOT Area:     3.80 cm  LV Volumes (MOD) LV vol d, MOD A2C: 88.6 ml LV vol d, MOD A4C: 86.9 ml LV vol s, MOD A2C: 47.7 ml LV vol s, MOD A4C: 33.9 ml LV SV MOD A2C:     40.9 ml LV SV MOD A4C:     86.9 ml LV SV MOD BP:      47.8 ml RIGHT VENTRICLE             IVC RV S prime:     14.70 cm/s  IVC diam: 2.00 cm TAPSE (M-mode): 2.8 cm LEFT ATRIUM             Index        RIGHT ATRIUM           Index LA diam:        3.90 cm 1.89 cm/m   RA Area:     16.30 cm LA Vol (A2C):   40.6 ml 19.66 ml/m  RA Volume:   38.40 ml  18.60 ml/m LA Vol (A4C):   27.7 ml 13.41 ml/m LA Biplane Vol: 34.5 ml 16.71 ml/m  AORTIC VALVE LVOT Vmax:   109.00 cm/s LVOT Vmean:  68.100 cm/s LVOT VTI:    0.219 m  AORTA Ao Root diam: 3.70 cm Ao Asc diam:  3.30 cm MITRAL VALVE MV Area (PHT): 3.17 cm    SHUNTS MV Decel Time: 239 msec    Systemic VTI:  0.22 m MV E  velocity: 68.10 cm/s  Systemic Diam: 2.20 cm MV A velocity: 65.50 cm/s MV E/A ratio:  1.04 Kardie Tobb DO Electronically signed by Dub Huntsman DO Signature Date/Time: 08/25/2023/1:39:55 PM    Final    CARDIAC CATHETERIZATION Result Date: 08/24/2023 2 vessel obstructive disease. Culprit lesion in the mid LAD. Also RV marginal stenosis Overall normal LV function with inferoapical wall motion abnormality Mildly elevated LVEDP Successful PCI of the mid LAD with DES Plan: DAPT for one year. Anticipate DC in am   DG Chest 2 View Result Date: 08/23/2023 CLINICAL DATA:  Chest pain EXAM: CHEST - 2 VIEW COMPARISON:  Chest x-ray 12/29/2016 FINDINGS: Left-sided pacemaker is present. The heart size and mediastinal contours are within normal limits. Both lungs are clear. The visualized skeletal structures are unremarkable. IMPRESSION: No active cardiopulmonary disease. Electronically Signed   By: Greig Pique M.D.   On: 08/23/2023 21:43    Microbiology: Results for orders placed or performed during the hospital encounter of 12/27/16  Surgical pcr screen     Status: None   Collection Time: 12/27/16  6:39 PM   Specimen: Nasal Mucosa; Nasal Swab  Result Value Ref Range Status   MRSA, PCR NEGATIVE NEGATIVE Final   Staphylococcus aureus NEGATIVE NEGATIVE Final    Comment:        The Xpert SA Assay (FDA approved for NASAL specimens in patients over 49 years of age), is one component of a comprehensive surveillance program.  Test performance has been validated by [facility] for patients greater than or equal to 59 year old. It is not intended to diagnose infection nor to guide or monitor treatment.     Labs: CBC: Recent Labs  Lab 08/23/23 2130 08/24/23 0300 08/24/23 1429 08/25/23 0318  WBC 11.8* 9.1 12.9* 10.2  HGB 16.2 15.0 15.9 14.8  HCT 46.8 45.7 47.6 44.0  MCV 91.8 93.6 91.5 90.9  PLT 144* 135* 129* 124*   Basic Metabolic Panel: Recent Labs  Lab 08/23/23 2130 08/24/23 0300  08/24/23 1429 08/25/23 0318  NA 140 137  --  138  K 3.4* 4.0  --  3.7  CL 107 107  --  107  CO2 24 25  --  23  GLUCOSE 161* 122*  --  115*  BUN 15 14  --  12  CREATININE 0.96 0.91 1.25* 1.22  CALCIUM  9.8 9.0  --  8.9  MG  --  2.0  --  1.9   Liver Function Tests: Recent Labs  Lab 08/23/23 2130  AST 36  ALT 45*  ALKPHOS 40  BILITOT 0.7  PROT 6.9  ALBUMIN 4.1   CBG: No results for input(s): GLUCAP in the last 168 hours.  Discharge time spent: approximately 45 minutes spent on discharge counseling, evaluation of patient on day of discharge, and coordination of discharge planning with nursing, social work, pharmacy and case management  Signed: Lonni SHAUNNA Dalton, MD [facility] 08/25/2023

## 2023-08-25 NOTE — Plan of Care (Signed)

## 2023-08-25 NOTE — Progress Notes (Signed)
 Patient complained of ingestion chest pains located mid upper chest and nausea. Patient denies shortness of breath and any radiating pain. EKG done. See chart. Margo Aye MD notified. No new orders.

## 2023-08-25 NOTE — Progress Notes (Signed)
 CARDIAC REHAB PHASE I   PRE:  Rate/Rhythm: 64 SR  BP:  Sitting: 146/85      SaO2: 97/RA  MODE:  Ambulation: 470 ft   POST:  Rate/Rhythm: 80 SR  BP:  Sitting: 145/88      SaO2: 97/RA  Pt tolerated exercise well and amb 470 ft independently. Pt denies CP, SOB, or dizziness throughout walk. Pt also educated on stent placement, risk factors, exercise guidelines, nitroglycerin  use, the importance of taking antiplatelet medications, restrictions and nutrition. Referral for CRPII was placed for the [facility] location.  [id number] Augustin KATHEE Sharps, RRT, BSRT 08/25/2023 11:31 AM

## 2023-08-25 NOTE — Progress Notes (Addendum)
   Patient Name: Charles Ellison. Date of Encounter: 08/25/2023 [facility] Cardiologist: Gordy Bergamo, MD   Interval Summary  .    Mid LAD PCI yesterday. Some nausea and vomiting peri-procedurally. No acute overnight events. Feels well this morning. Hoping to go home soon.  Vital Signs .    Vitals:   08/24/23 2043 08/25/23 0013 08/25/23 0319 08/25/23 0900  BP: 134/73 124/79 136/77 135/81  Pulse: 60 69 69 (!) 59  Resp: 16 20 16    Temp: 97.7 F (36.5 C) 98 F (36.7 C) 98.4 F (36.9 C)   TempSrc: Oral Oral Oral   SpO2: 96% 96% 93%   Weight:      Height:       No intake or output data in the 24 hours ending 08/25/23 1054    08/24/2023    9:06 AM 03/22/2021    8:27 AM 01/28/2020   10:27 AM  Last 3 Weights  Weight (lbs) 195 lb 190 lb 197 lb  Weight (kg) 88.451 kg 86.183 kg 89.359 kg      Telemetry/ECG    Predominantly AP VS rhythm. 5 beats NSVT. Rare PVCs. - Personally Reviewed  Physical Exam .   GEN: No acute distress.   Neck: No JVD Cardiac: Normal rate and regular rhythm. Respiratory: Clear to auscultation bilaterally. GI: Soft, nontender, non-distended  MS: No edema  Assessment & Plan .   [name]. is a 73 y.o. male with a hx of sinus arrest s/p PPM, HTN, NSVT who presented on 08/24/2023 for the evaluation of chest pain at the request of Dr. Cheryle. He was diagnosed with NSTEMI. He underwent LHC which showed mid LAD occlusion. Underwent successful PCI to mid LAD.   #. NSTEMI #. CAD  -Continue aspirin  and ticagrelor . -Start atorvastatin  80mg  once daily.  -Metoprolol  XL 50mg  once daily.  -Echocardiogram pending.    #. Device detected PAF/flutter:  -Noted on prior PPM interrogations reviewed by Dr. Inocencio.  -Burden remains <0.1%.  He will remain off oral anticoagulation at this time.  If he has any episodes greater than 24 hours in duration or overall burden increases significantly then he will need to start oral anticoagulation.   Probable  discharge today pending echocardiogram.   For questions or updates, please contact [facility] Please consult [email or web address] for contact info under        Signed, Fonda Kitty, MD

## 2023-08-25 NOTE — Progress Notes (Signed)
  Echocardiogram 2D Echocardiogram has been performed.  Charles Ellison 08/25/2023, 12:07 PM

## 2023-08-25 NOTE — Plan of Care (Signed)
  Problem: Education: Goal: Knowledge of General Education information will improve Description: Including pain rating scale, medication(s)/side effects and non-pharmacologic comfort measures Outcome: Adequate for Discharge   Problem: Health Behavior/Discharge Planning: Goal: Ability to manage health-related needs will improve Outcome: Adequate for Discharge   Problem: Clinical Measurements: Goal: Ability to maintain clinical measurements within normal limits will improve Outcome: Adequate for Discharge Goal: Will remain free from infection Outcome: Adequate for Discharge Goal: Diagnostic test results will improve Outcome: Adequate for Discharge Goal: Respiratory complications will improve Outcome: Adequate for Discharge Goal: Cardiovascular complication will be avoided Outcome: Adequate for Discharge   Problem: Activity: Goal: Risk for activity intolerance will decrease Outcome: Adequate for Discharge   Problem: Nutrition: Goal: Adequate nutrition will be maintained Outcome: Adequate for Discharge   Problem: Coping: Goal: Level of anxiety will decrease Outcome: Adequate for Discharge   Problem: Elimination: Goal: Will not experience complications related to bowel motility Outcome: Adequate for Discharge Goal: Will not experience complications related to urinary retention Outcome: Adequate for Discharge   Problem: Pain Management: Goal: General experience of comfort will improve Outcome: Adequate for Discharge   Problem: Safety: Goal: Ability to remain free from injury will improve Outcome: Adequate for Discharge   Problem: Skin Integrity: Goal: Risk for impaired skin integrity will decrease Outcome: Adequate for Discharge   Problem: Education: Goal: Understanding of CV disease, CV risk reduction, and recovery process will improve Outcome: Adequate for Discharge Goal: Individualized Educational Video(s) Outcome: Adequate for Discharge   Problem:  Activity: Goal: Ability to return to baseline activity level will improve Outcome: Adequate for Discharge   Problem: Cardiovascular: Goal: Ability to achieve and maintain adequate cardiovascular perfusion will improve Outcome: Adequate for Discharge Goal: Vascular access site(s) Level 0-1 will be maintained Outcome: Adequate for Discharge   Problem: Health Behavior/Discharge Planning: Goal: Ability to safely manage health-related needs after discharge will improve Outcome: Adequate for Discharge

## 2023-08-27 ENCOUNTER — Encounter (HOSPITAL_COMMUNITY): Payer: Self-pay | Admitting: Cardiology

## 2023-08-27 ENCOUNTER — Other Ambulatory Visit (HOSPITAL_COMMUNITY): Payer: Self-pay | Admitting: *Deleted

## 2023-08-27 ENCOUNTER — Telehealth (HOSPITAL_COMMUNITY): Payer: Self-pay

## 2023-08-27 DIAGNOSIS — I214 Non-ST elevation (NSTEMI) myocardial infarction: Secondary | ICD-10-CM

## 2023-08-27 DIAGNOSIS — Z955 Presence of coronary angioplasty implant and graft: Secondary | ICD-10-CM

## 2023-08-27 NOTE — Telephone Encounter (Signed)
 Returned pt phone call in regards to CR, pt stated he would like to schedule for CR. Adv pt he will need to complete his f/u appt and be cleared 1st. Patient verbalized understanding.

## 2023-08-28 LAB — LIPOPROTEIN A (LPA): Lipoprotein (a): 62.8 nmol/L — ABNORMAL HIGH (ref <75.0)

## 2023-08-30 ENCOUNTER — Telehealth (HOSPITAL_COMMUNITY): Payer: Self-pay

## 2023-08-30 NOTE — Telephone Encounter (Signed)
Called patient to see if he is interested in the Cardiac Rehab Program. Patient expressed interest. Explained scheduling process, patient verbalized understanding. Will contact patient for scheduling once f/u has been completed.  

## 2023-09-01 NOTE — Progress Notes (Unsigned)
Cardiology Office Note:  .   Date:  09/03/2023  ID:  Charles Boss., DOB [birth year], MRN [id number] PCP: Emilio Aspen, MD  [facility] Providers Cardiologist:  Yates Decamp, MD Electrophysiologist:  Will Jorja Loa, MD    Patient Profile: .      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  He underwent implant of Medtronic PPM.  Unfortunately he was lost to cardiology follow-up since 2020 when he last saw Dr. Elberta Fortis in clinic.  Last PPM interrogation 07/2023 showed 41 A-fib/flutter events with the longest duration 30 minutes, rates were controlled.  Of note he is not on oral anticoagulation.  He presented to [facility] ED 08/23/2023 with substernal chest pressure.  He was hypertensive at home with SBP 190s.  Of note, he got COVID vaccination 2 days prior. Hs troponin elevated 53 > 527 > 2379, EKG with old inferior infarct, appears sinus rhythm with first-degree heart block.  Cardiology consulted for NSTEMI.  Overnight fellow was contacted concluded EKG did not meet STEMI criteria and recommended trending cardiac enzymes and started heparin gtt. Prior to admission, was normal state of health until the previous evening when he was rolling the trash cans from the road and felt onset of chest pressure around 2100.  Chest pressure did not radiate but was associated with shortness of breath, nausea, and emesis x 1.  His wife states he reported an overwhelming sense of doom and felt something "bad" was about to happen. He agreed to to ER. Upon arrival, he vomited x 1 which  seemed to relieve his chest pain.   He underwent LHC 08/24/23 which revealed 2 vessel obstructive disease, culprit lesion mid LAD, also RV marginal stenosis, overall normal LV function with inferior apical wall motion abnormality, mildly elevated LVEDP, successful PCI of mid LAD with DES. Echocardiogram 08/25/2023 with normal LVEF 55 to 60%, apical hypokinesis, G1 DD, mildly reduced RV function, moderately enlarged RV, mild MR, no prior echo for comparison. EP consult for a fib and NSVT.  A-fib burden < 0.1% on PPM, therefore he was advised he could remain off oral anticoagulation at this time.  If any episodes greater than 24 hours in duration or overall burden increases significantly then plan to start oral anticoagulation.  He was continued on home metoprolol.  Atorvastatin, aspirin, and Brilinta were added.       History of Present Illness: Charles Boss. is a very pleasant 73 y.o. male who is here today for hospital follow-up and is accompanied by his wife. They express dissatisfaction with the lack of communication during the hospitalization. States they never saw anyone from cardiology after the cath except for Dr. Jimmey Ralph. He reports intermittent chest discomfort, which he initially attributed to heartburn and managed with daily Pepcid. However, since the MI, he is concerned that this discomfort may be cardiac in nature. Discomfort occurs randomly and is not associated with any specific triggers. He is not having any chest discomfort now.  He also reports that he feels occasional  "flutters," but denies any recent episodes. They are not exacerbated by any particular  triggers to his awareness.  He denies shortness of breath, orthopnea, PND, edema, presyncope, syncope.  Prior to admission, he was walking up to 4 miles daily.  Generally follows a largely plant based diet, some seafood.   Discussed the use of AI scribe software for clinical note transcription with the patient, who gave verbal  consent to proceed.   ROS: See HPI       Studies Reviewed: .        Risk Assessment/Calculations:    CHA2DS2-VASc Score = 3   This indicates a 3.2% annual risk of stroke. The patient's score is based upon: CHF History: 0 HTN History: 1 Diabetes History: 0 Stroke History: 0 Vascular Disease History: 1 Age Score: 1 Gender Score: 0            Physical Exam:   VS:  BP 119/60   Pulse 62   Ht 5\' 10"  (1.778 m)   Wt 202 lb 9.6 oz (91.9 kg)   SpO2 98%   BMI 29.07 kg/m    Wt Readings from Last 3 Encounters:  09/03/23 202 lb 9.6 oz (91.9 kg)  08/24/23 195 lb (88.5 kg)  03/22/21 190 lb (86.2 kg)    GEN: Well nourished, well developed in no acute distress NECK: No JVD; No carotid bruits CARDIAC: RRR, no murmurs, rubs, gallops RESPIRATORY:  Clear to auscultation without rales, wheezing or rhonchi  ABDOMEN: Soft, non-tender, non-distended EXTREMITIES:  No edema; bruising to RUE in appropriate stage of healing     ASSESSMENT AND PLAN: .    CAD s/p NSTEMI: S/p DES x 1 to mid LAD with residual disease in RV branch and Cx, medical management recommended. Prior to admission, he was having occasional chest discomfort which he attributed to acid reflux.  He continues to have some mild chest discomfort which he wonders whether it is angina versus GERD. Takes OTC Pepcid as needed which generally provides relief. Lengthy discussion about s/s of angina and medical management of CAD. He did not get SL NTG rx when he was discharged, we will provide that today.  He is interested in cardiac rehab which I have strongly encouraged. Advised he may gradually increase activity as tolerated. Encouraged him to avoid extreme cold.  He is tolerating medications without any concerning side effects.  No bleeding concerns.  We will continue GDMT including metoprolol, atorvastatin, aspirin, Brilinta. Advised him to notify us if symptoms of chest discomfort worsen.   Hyperlipidemia LDL goal < 70: Lipid panel  completed 08/21/2023 with total cholesterol 243, HDL 52, LDL 157, and triglycerides 187. He was not on statin therapy prior to admission. LP(a) is not elevated.  He was started on atorvastatin 80 mg daily during hospitalization. We will recheck fasting lipid panel and LFTs in 2 to 3 months.  Hypertension: BP is well controlled.  He was on metoprolol at time of admission.  No additional antihypertensive agents were added.   SSS s/p PPM implant: Normal device function on remote device check 07/18/23. Last seen in clinic by Otilio Saber, PA, on 03/22/2021.  Is unsure how he missed future appointments.  No acute concerns today.  We will have him see Dr. Elberta Fortis for soon follow-up.   PAF: Per EP, a fib burden < 0.1% so can remain off OAC at this time. Monitor for episodes > 24 hours or greater burden. Occasional flutters are noted, no sustained symptoms. We discussed the difference in [facility] versus antiplatelet therapy. Advised him to notify us if he has concerning  palpitations.    Cardiac Rehabilitation Eligibility Assessment  The patient is ready to start cardiac rehabilitation from a cardiac standpoint.       Disposition: 2-3 months with Dr. Lorri Frederick months with Dr. Jacinto Halim  Signed, Eligha Bridegroom, NP-C

## 2023-09-03 ENCOUNTER — Ambulatory Visit: Payer: PPO | Attending: Nurse Practitioner | Admitting: Nurse Practitioner

## 2023-09-03 ENCOUNTER — Encounter: Payer: Self-pay | Admitting: Nurse Practitioner

## 2023-09-03 VITALS — BP 119/60 | HR 62 | Ht 70.0 in | Wt 202.6 lb

## 2023-09-03 DIAGNOSIS — E785 Hyperlipidemia, unspecified: Secondary | ICD-10-CM

## 2023-09-03 DIAGNOSIS — I10 Essential (primary) hypertension: Secondary | ICD-10-CM

## 2023-09-03 DIAGNOSIS — I214 Non-ST elevation (NSTEMI) myocardial infarction: Secondary | ICD-10-CM | POA: Diagnosis not present

## 2023-09-03 DIAGNOSIS — I480 Paroxysmal atrial fibrillation: Secondary | ICD-10-CM

## 2023-09-03 DIAGNOSIS — I4729 Other ventricular tachycardia: Secondary | ICD-10-CM | POA: Diagnosis not present

## 2023-09-03 DIAGNOSIS — I2510 Atherosclerotic heart disease of native coronary artery without angina pectoris: Secondary | ICD-10-CM

## 2023-09-03 DIAGNOSIS — I495 Sick sinus syndrome: Secondary | ICD-10-CM | POA: Diagnosis not present

## 2023-09-03 DIAGNOSIS — Z950 Presence of cardiac pacemaker: Secondary | ICD-10-CM

## 2023-09-03 LAB — ECHOCARDIOGRAM COMPLETE
Area-P 1/2: 3.17 cm2
Calc EF: 54.5 %
Height: 70 in
S' Lateral: 2.8 cm
Single Plane A2C EF: 46.2 %
Single Plane A4C EF: 61 %
Weight: 3120 [oz_av]

## 2023-09-03 MED ORDER — NITROGLYCERIN 0.4 MG SL SUBL: 0.4000 mg | SUBLINGUAL_TABLET | SUBLINGUAL | 3 refills | Status: AC | PRN

## 2023-09-03 NOTE — Patient Instructions (Addendum)
Medication Instructions:  Take one (1) tablet by mouth ( 0.4 mg) sublingual ( under the tongue).    If a single episode of chest pain is not relieved by one tablet, the patient will try another within 5 minutes; and if this doesn't relieve the pain, the patient will try another within 5 minutes and if this doesn't relieve the pain the patient is instructed to call 911 for transportation to an emergency department.   *If you need a refill on your cardiac medications before your next appointment, please call your pharmacy*   Lab Work:  Your physician recommends that you return for a FASTING lipid profile, fasting after midnight. You can go to any [facility] listed below in 2 months. Paperwork given to pt today.          If you have labs (blood work) drawn today and your tests are completely normal, you will receive your results only by: MyChart Message (if you have MyChart) OR A paper copy in the mail If you have any lab test that is abnormal or we need to change your treatment, we will call you to review the results.   Testing/Procedures:  None ordered.   Follow-Up: At [facility], you and your health needs are our priority.  As part of our continuing mission to provide you with exceptional heart care, we have created designated Provider Care Teams.  These Care Teams include your primary Cardiologist (physician) and Advanced Practice Providers (APPs -  Physician Assistants and Nurse Practitioners) who all work together to provide you with the care you need, when you need it.  We recommend signing up for the patient portal called "MyChart".  Sign up information is provided on this After Visit Summary.  MyChart is used to connect with patients for Virtual Visits (Telemedicine).  Patients are able to view lab/test results, encounter notes, upcoming appointments, etc.  Non-urgent messages can be sent to your provider as well.   To learn more about what you can do with MyChart, go  to [email or web address].    Your next appointment:   3 month(s)  Provider:   Steffanie Dunn, MD  Yates Decamp, MD   Other Instructions    1st Floor: - Lobby - Registration  - Pharmacy  - Lab - Cafe  2nd Floor: - PV Lab - Diagnostic Testing (echo, CT, nuclear med)  3rd Floor: - Vacant  4th Floor: - TCTS (cardiothoracic surgery) - AFib Clinic - Structural Heart Clinic - Vascular Surgery  - Vascular Ultrasound  5th Floor: - HeartCare Cardiology (general and EP) - Clinical Pharmacy for coumadin, hypertension, lipid, weight-loss medications, and med management appointments    Valet parking services will be available as well.

## 2023-09-06 ENCOUNTER — Telehealth (HOSPITAL_COMMUNITY): Payer: Self-pay

## 2023-09-06 ENCOUNTER — Encounter (HOSPITAL_COMMUNITY): Payer: Self-pay

## 2023-09-06 NOTE — Telephone Encounter (Signed)
Attempted to call patient in regards to Cardiac Rehab - LM on VM Mailed letter 

## 2023-09-17 ENCOUNTER — Telehealth (HOSPITAL_COMMUNITY): Payer: Self-pay

## 2023-09-17 NOTE — Telephone Encounter (Signed)
Pt returned CR phone call and stated he is interested in CR.  Patient will come in for orientation on 09/18/23 @ 8AM and will attend the 815AM exercise class.   Pensions consultant.

## 2023-09-17 NOTE — Telephone Encounter (Signed)
Pt insurance is active and benefits verified through [employer]. Co-pay $15.00, DED $0.00/$0.00 met, out of pocket $3,400.00/$0.00 met, co-insurance 0%. No pre-authorization required. [place]/[employer], 09/17/23 @ 10:25AM, [id number]   How many CR sessions are covered? (36 visits for TCR, 72 visits for ICR)72 Is this a lifetime maximum or an annual maximum? Annual Has the member used any of these services to date? No Is there a time limit (weeks/months) on start of program and/or program completion? No

## 2023-09-18 ENCOUNTER — Encounter (HOSPITAL_COMMUNITY)
Admission: RE | Admit: 2023-09-18 | Discharge: 2023-09-18 | Disposition: A | Discharge status: Home / Self Care | Payer: PPO | Source: Ambulatory Visit | Attending: Cardiology | Admitting: Cardiology

## 2023-09-18 VITALS — BP 118/70 | HR 63 | Ht 70.5 in | Wt 201.1 lb

## 2023-09-18 DIAGNOSIS — I214 Non-ST elevation (NSTEMI) myocardial infarction: Secondary | ICD-10-CM | POA: Insufficient documentation

## 2023-09-18 DIAGNOSIS — Z48812 Encounter for surgical aftercare following surgery on the circulatory system: Secondary | ICD-10-CM | POA: Insufficient documentation

## 2023-09-18 DIAGNOSIS — Z955 Presence of coronary angioplasty implant and graft: Secondary | ICD-10-CM | POA: Insufficient documentation

## 2023-09-18 NOTE — Progress Notes (Signed)
 Patient here for cardiac rehab orientation this morning. Reports having indigestion for the last hour. Charles Ellison denies having chest pain. Skin warm and dry. Charles Ellison says he has been having GERD symptoms for the last year.  Charles Ellison reports the indigestion discomfort as mild. Vital signs are as follows. BP 118/70. Oxygen saturation 96% on room air. Telemetry rhythm A Paced. Will consult with onsite provider Charles Beauvais NP. Onsite provider Charles Ellison consulted shown today's ECG tracings. No new orders received. Will proceed with cardiac rehab orientation and exercise. I encouraged Charles Ellison to follow up with his PCP regarding his ongoing GERD symptoms. Patient states understanding and will notify staff for any change or increase in symptoms.   Charles Ellison says when he takes Pepcid  his symptoms improve.Will continue to monitor the patient throughout  the program. Hadassah Elpidio Quan RN BSN

## 2023-09-18 NOTE — Progress Notes (Signed)
Cardiac Rehab Medication Review   Does the patient  feel that his/her medications are working for him/her?  yes  Has the patient been experiencing any side effects to the medications prescribed?  no  Does the patient measure his/her own blood pressure or blood glucose at home?  no   Does the patient have any problems obtaining medications due to transportation or finances?   no  Understanding of regimen: excellent Understanding of indications: excellent Potential of compliance: excellent    Comments: Pt has no questions regarding his medications.    Lorin Picket 09/18/2023 8:04 AM

## 2023-09-18 NOTE — Progress Notes (Signed)
 Cardiac Individual Treatment Plan  Patient Details  Name: Charles Ellison. MRN: [id number] Date of Birth: 05-11-1951 Referring Provider:   Flowsheet Row INTENSIVE CARDIAC REHAB ORIENT from 09/18/2023 in [facility]  Referring Provider Gordy Bergamo, MD       Initial Encounter Date:  Flowsheet Row INTENSIVE CARDIAC REHAB ORIENT from 09/18/2023 in [facility]  Date 09/18/23       Visit Diagnosis: 08/24/23 NSTEMI (non-ST elevated myocardial infarction) (HCC)  08/24/23 Status post coronary artery stent placement  Patient's Home Medications on Admission:  Current Outpatient Medications:    aspirin  EC 81 MG tablet, Take 1 tablet (81 mg total) by mouth daily. Swallow whole., Disp: , Rfl:    atorvastatin  (LIPITOR) 80 MG tablet, Take 1 tablet (80 mg total) by mouth daily., Disp: 30 tablet, Rfl: 11   metoprolol  succinate (TOPROL -XL) 100 MG 24 hr tablet, TAKE 1 TABLET(100 MG) BY MOUTH DAILY, Disp: 30 tablet, Rfl: 0   nitroGLYCERIN  (NITROSTAT ) 0.4 MG SL tablet, Place 1 tablet (0.4 mg total) under the tongue every 5 (five) minutes as needed for chest pain., Disp: 25 tablet, Rfl: 3   ticagrelor  (BRILINTA ) 90 MG TABS tablet, Take 1 tablet (90 mg total) by mouth 2 (two) times daily., Disp: 60 tablet, Rfl: 11   Collagen-Vitamin C-Biotin (COLLAGEN PO), Take 1 capsule by mouth daily with breakfast. (Patient not taking: Reported on 09/18/2023), Disp: , Rfl:    Glucosamine-Chondroit-Vit C-Mn (GLUCOSAMINE-CHONDROITIN) TABS, Take 1 tablet by mouth daily with breakfast. (Patient not taking: Reported on 09/18/2023), Disp: , Rfl:    Omega-3 Fatty Acids (FISH OIL PO), Take 1 capsule by mouth daily with breakfast. (Patient not taking: Reported on 09/18/2023), Disp: , Rfl:    PEPCID  COMPLETE [phone] MG chewable tablet, Chew 1 tablet by mouth 2 (two) times daily as needed (for heartburn/indigestion).  (Patient not taking: Reported on 09/18/2023), Disp: , Rfl:   Past Medical History: Past Medical History:  Diagnosis Date   Essential hypertension    Sinus arrest 12/27/2016   thelbert 12/27/2016    Tobacco Use: Social History   Tobacco Use  Smoking Status Never  Smokeless Tobacco Never    Labs: Review Flowsheet        No data to display          Capillary Blood Glucose: No results found for: GLUCAP   Exercise Target Goals: Exercise Program Goal: Individual exercise prescription set using results from initial 6 min walk test and THRR while considering  patient's activity barriers and safety.   Exercise Prescription Goal: Initial exercise prescription builds to 30-45 minutes a day of aerobic activity, 2-3 days per week.  Home exercise guidelines will be given to patient during program as part of exercise prescription that the participant will acknowledge.  Activity Barriers & Risk Stratification:  Activity Barriers & Cardiac Risk Stratification - 09/18/23 0938       Activity Barriers & Cardiac Risk Stratification   Activity Barriers Chest Pain/Angina    Cardiac Risk Stratification High             6 Minute Walk:  6 Minute Walk     Row Name 09/18/23 0905         6 Minute Walk   Phase Initial     Distance 1836 feet     Walk Time 6 minutes     # of Rest Breaks 0  MPH 3.5     METS 3.9     RPE 13     Perceived Dyspnea  0     VO2 Peak 13.65     Symptoms No     Resting HR 63 bpm     Resting BP 118/70     Resting Oxygen Saturation  96 %     Exercise Oxygen Saturation  during 6 min walk 99 %     Max Ex. HR 103 bpm     Max Ex. BP 136/80     2 Minute Post BP 120/80              Oxygen Initial Assessment:   Oxygen Re-Evaluation:   Oxygen Discharge (Final Oxygen Re-Evaluation):   Initial Exercise Prescription:  Initial Exercise Prescription - 09/18/23 1000       Date of Initial Exercise RX and Referring Provider   Date 09/18/23     Referring Provider Gordy Bergamo, MD    Expected Discharge Date 12/12/23      Bike   Level 2    Watts 35    Minutes 15    METs 3.9      Recumbant Elliptical   Level 2    RPM 60    Watts 35    Minutes 15    METs 3.9      Prescription Details   Frequency (times per week) 3    Duration Progress to 30 minutes of continuous aerobic without signs/symptoms of physical distress      Intensity   THRR 40-80% of Max Heartrate 60-120    Ratings of Perceived Exertion 11-13    Perceived Dyspnea 0-4      Progression   Progression Continue progressive overload as per policy without signs/symptoms or physical distress.      Resistance Training   Training Prescription Yes    Weight 4 lbs    Reps 10-15             Perform Capillary Blood Glucose checks as needed.  Exercise Prescription Changes:   Exercise Comments:   Exercise Goals and Review:   Exercise Goals     Row Name 09/18/23 0943             Exercise Goals   Increase Physical Activity Yes       Intervention Provide advice, education, support and counseling about physical activity/exercise needs.;Develop an individualized exercise prescription for aerobic and resistive training based on initial evaluation findings, risk stratification, comorbidities and participant's personal goals.       Expected Outcomes Short Term: Attend rehab on a regular basis to increase amount of physical activity.;Long Term: Exercising regularly at least 3-5 days a week.;Long Term: Add in home exercise to make exercise part of routine and to increase amount of physical activity.       Increase Strength and Stamina Yes       Intervention Develop an individualized exercise prescription for aerobic and resistive training based on initial evaluation findings, risk stratification, comorbidities and participant's personal goals.;Provide advice, education, support and counseling about physical activity/exercise needs.       Expected Outcomes Short  Term: Increase workloads from initial exercise prescription for resistance, speed, and METs.;Short Term: Perform resistance training exercises routinely during rehab and add in resistance training at home;Long Term: Improve cardiorespiratory fitness, muscular endurance and strength as measured by increased METs and functional capacity ( )       Able to understand and use rate of perceived exertion (  RPE) scale Yes       Intervention Provide education and explanation on how to use RPE scale       Expected Outcomes Short Term: Able to use RPE daily in rehab to express subjective intensity level;Long Term:  Able to use RPE to guide intensity level when exercising independently       Knowledge and understanding of Target Heart Rate Range (THRR) Yes       Intervention Provide education and explanation of THRR including how the numbers were predicted and where they are located for reference       Expected Outcomes Short Term: Able to state/look up THRR;Long Term: Able to use THRR to govern intensity when exercising independently;Short Term: Able to use daily as guideline for intensity in rehab       Understanding of Exercise Prescription Yes       Intervention Provide education, explanation, and written materials on patient's individual exercise prescription       Expected Outcomes Short Term: Able to explain program exercise prescription;Long Term: Able to explain home exercise prescription to exercise independently                Exercise Goals Re-Evaluation :   Discharge Exercise Prescription (Final Exercise Prescription Changes):   Nutrition:  Target Goals: Understanding of nutrition guidelines, daily intake of sodium 1500mg , cholesterol 200mg , calories 30% from fat and 7% or less from saturated fats, daily to have 5 or more servings of fruits and vegetables.  Biometrics:  Pre Biometrics - 09/18/23 0810       Pre Biometrics   Waist Circumference 43 inches    Hip Circumference 40.5  inches    Waist to Hip Ratio 1.06 %    Triceps Skinfold 15 mm    % Body Fat 29.1 %    Grip Strength 46 kg    Flexibility 0 in   could not reach   Single Leg Stand 25.37 seconds              Nutrition Therapy Plan and Nutrition Goals:   Nutrition Assessments:  MEDIFICTS Score Key: >=70 Need to make dietary changes  40-70 Heart Healthy Diet <= 40 Therapeutic Level Cholesterol Diet    Picture Your Plate Scores: <59 Unhealthy dietary pattern with much room for improvement. 41-50 Dietary pattern unlikely to meet recommendations for good health and room for improvement. 51-60 More healthful dietary pattern, with some room for improvement.  >60 Healthy dietary pattern, although there may be some specific behaviors that could be improved.    Nutrition Goals Re-Evaluation:   Nutrition Goals Re-Evaluation:   Nutrition Goals Discharge (Final Nutrition Goals Re-Evaluation):   Psychosocial: Target Goals: Acknowledge presence or absence of significant depression and/or stress, maximize coping skills, provide positive support system. Participant is able to verbalize types and ability to use techniques and skills needed for reducing stress and depression.  Initial Review & Psychosocial Screening:  Initial Psych Review & Screening - 09/18/23 1015       Initial Review   Current issues with Current Depression;Current Sleep Concerns;Current Stress Concerns    Source of Stress Concerns Occupation    Comments Pt with h/o of depression. Has seen therapist/physciatrist in the past. Does not feel that he needs to see anyone at this time.      Family Dynamics   Good Support System? Yes   Has wife, Olam, for support     Barriers   Psychosocial barriers to participate in program The patient should  benefit from training in stress management and relaxation.      Screening Interventions   Interventions Encouraged to exercise    Expected Outcomes Short Term goal: Utilizing psychosocial  counselor, staff and physician to assist with identification of specific Stressors or current issues interfering with healing process. Setting desired goal for each stressor or current issue identified.;Long Term Goal: Stressors or current issues are controlled or eliminated.;Short Term goal: Identification and review with participant of any Quality of Life or Depression concerns found by scoring the questionnaire.;Long Term goal: The participant improves quality of Life and PHQ9 Scores as seen by post scores and/or verbalization of changes             Quality of Life Scores:  Quality of Life - 09/18/23 1018       Quality of Life   Select Quality of Life      Quality of Life Scores   Health/Function Pre 25.5 %    Socioeconomic Pre 26.21 %    Psych/Spiritual Pre 19.57 %    Family Pre 26.4 %    GLOBAL Pre 24.56 %            Scores of 19 and below usually indicate a poorer quality of life in these areas.  A difference of  2-3 points is a clinically meaningful difference.  A difference of 2-3 points in the total score of the Quality of Life Index has been associated with significant improvement in overall quality of life, self-image, physical symptoms, and general health in studies assessing change in quality of life.  PHQ-9: Review Flowsheet       09/18/2023 01/13/2015  Depression screen PHQ 2/9  Decreased Interest 1 0  Down, Depressed, Hopeless 1 0  PHQ - 2 Score 2 0  Altered sleeping 2 -  Tired, decreased energy 0 -  Change in appetite 1 -  Feeling bad or failure about yourself  1 -  Trouble concentrating 0 -  Moving slowly or fidgety/restless 0 -  Suicidal thoughts 0 -  PHQ-9 Score 6 -  Difficult doing work/chores Somewhat difficult -   Interpretation of Total Score  Total Score Depression Severity:  1-4 = Minimal depression, 5-9 = Mild depression, 10-14 = Moderate depression, 15-19 = Moderately severe depression, 20-27 = Severe depression   Psychosocial Evaluation and  Intervention:   Psychosocial Re-Evaluation:   Psychosocial Discharge (Final Psychosocial Re-Evaluation):   Vocational Rehabilitation: Provide vocational rehab assistance to qualifying candidates.   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 Participant will state understanding/return demonstration of topics presented.     Core Videos: Exercise    Move It!  Clinical staff conducted group or individual video education with verbal and written material and guidebook.  Patient learns the recommended Pritikin exercise program. Exercise with the goal of living a long, healthy life. Some of the health benefits of exercise include controlled diabetes, healthier blood pressure levels, improved cholesterol levels, improved heart and lung capacity, improved sleep, and better body composition. Everyone should speak with their doctor before starting or changing an exercise routine.  Biomechanical Limitations Clinical staff conducted group or individual video education  with verbal and written material and guidebook.  Patient learns how biomechanical limitations can impact exercise and how we can mitigate and possibly overcome limitations to have an impactful and balanced exercise routine.  Body Composition Clinical staff conducted group or individual video education with verbal and written material and guidebook.  Patient learns that body composition (ratio of muscle mass to fat mass) is a key component to assessing overall fitness, rather than body weight alone. Increased fat mass, especially visceral belly fat, can put us  at increased risk for metabolic syndrome, type 2 diabetes,  heart disease, and even death. It is recommended to combine diet and exercise (cardiovascular and resistance training) to improve your body composition. Seek guidance from your physician and exercise physiologist before implementing an exercise routine.  Exercise Action Plan Clinical staff conducted group or individual video education with verbal and written material and guidebook.  Patient learns the recommended strategies to achieve and enjoy long-term exercise adherence, including variety, self-motivation, self-efficacy, and positive decision making. Benefits of exercise include fitness, good health, weight management, more energy, better sleep, less stress, and overall well-being.  Medical   Heart Disease Risk Reduction Clinical staff conducted group or individual video education with verbal and written material and guidebook.  Patient learns our heart is our most vital organ as it circulates oxygen, nutrients, white blood cells, and hormones throughout the entire body, and carries waste away. Data supports a plant-based eating plan like the Pritikin Program for its effectiveness in slowing progression of and reversing heart disease. The video provides a number of recommendations to address heart disease.   Metabolic Syndrome and Belly Fat  Clinical staff conducted group or individual video education with verbal and written material and guidebook.  Patient learns what metabolic syndrome is, how it leads to heart disease, and how one can reverse it and keep it from coming back. You have metabolic syndrome if you have 3 of the following 5 criteria: abdominal obesity, high blood pressure, high triglycerides, low HDL cholesterol, and high blood sugar.  Hypertension and Heart Disease Clinical staff conducted group or individual video education with verbal and written material and guidebook.  Patient learns that high blood pressure, or hypertension, is very common in the [place] . Hypertension is  largely due to excessive salt intake, but other important risk factors include being overweight, physical inactivity, drinking too much alcohol, smoking, and not eating enough potassium from fruits and vegetables. High blood pressure is a leading risk factor for heart attack, stroke, congestive heart failure, dementia, kidney failure, and premature death. Long-term effects of excessive salt intake include stiffening of the arteries and thickening of heart muscle and organ damage. Recommendations include ways to reduce hypertension and the risk of heart disease.  Diseases of Our Time - Focusing on Diabetes Clinical staff conducted group or individual video education with verbal and written material and guidebook.  Patient learns why the best way to stop diseases of our time is prevention, through food and other lifestyle changes. Medicine (such as prescription pills and surgeries) is often only a Band-Aid on the problem, not a long-term solution. Most common diseases of our time include obesity, type 2 diabetes, hypertension, heart disease, and cancer. The Pritikin Program is recommended and has been proven to help reduce, reverse, and/or prevent the damaging effects of metabolic syndrome.  Nutrition   Overview of the Pritikin Eating Plan  Clinical staff conducted group or individual video education with verbal and written material and guidebook.  Patient learns about  the Pritikin Eating Plan for disease risk reduction. The Pritikin Eating Plan emphasizes a wide variety of unrefined, minimally-processed carbohydrates, like fruits, vegetables, whole grains, and legumes. Go, Caution, and Stop food choices are explained. Plant-based and lean animal proteins are emphasized. Rationale provided for low sodium intake for blood pressure control, low added sugars for blood sugar stabilization, and low added fats and oils for coronary artery disease risk reduction and weight management.  Calorie Density  Clinical  staff conducted group or individual video education with verbal and written material and guidebook.  Patient learns about calorie density and how it impacts the Pritikin Eating Plan. Knowing the characteristics of the food you choose will help you decide whether those foods will lead to weight gain or weight loss, and whether you want to consume more or less of them. Weight loss is usually a side effect of the Pritikin Eating Plan because of its focus on low calorie-dense foods.  Label Reading  Clinical staff conducted group or individual video education with verbal and written material and guidebook.  Patient learns about the Pritikin recommended label reading guidelines and corresponding recommendations regarding calorie density, added sugars, sodium content, and whole grains.  Dining Out - Part 1  Clinical staff conducted group or individual video education with verbal and written material and guidebook.  Patient learns that restaurant meals can be sabotaging because they can be so high in calories, fat, sodium, and/or sugar. Patient learns recommended strategies on how to positively address this and avoid unhealthy pitfalls.  Facts on Fats  Clinical staff conducted group or individual video education with verbal and written material and guidebook.  Patient learns that lifestyle modifications can be just as effective, if not more so, as many medications for lowering your risk of heart disease. A Pritikin lifestyle can help to reduce your risk of inflammation and atherosclerosis (cholesterol build-up, or plaque, in the artery walls). Lifestyle interventions such as dietary choices and physical activity address the cause of atherosclerosis. A review of the types of fats and their impact on blood cholesterol levels, along with dietary recommendations to reduce fat intake is also included.  Nutrition Action Plan  Clinical staff conducted group or individual video education with verbal and written  material and guidebook.  Patient learns how to incorporate Pritikin recommendations into their lifestyle. Recommendations include planning and keeping personal health goals in mind as an important part of their success.  Healthy Mind-Set    Healthy Minds, Bodies, Hearts  Clinical staff conducted group or individual video education with verbal and written material and guidebook.  Patient learns how to identify when they are stressed. Video will discuss the impact of that stress, as well as the many benefits of stress management. Patient will also be introduced to stress management techniques. The way we think, act, and feel has an impact on our hearts.  How Our Thoughts Can Heal Our Hearts  Clinical staff conducted group or individual video education with verbal and written material and guidebook.  Patient learns that negative thoughts can cause depression and anxiety. This can result in negative lifestyle behavior and serious health problems. Cognitive behavioral therapy is an effective method to help control our thoughts in order to change and improve our emotional outlook.  Additional Videos:  Exercise    Improving Performance  Clinical staff conducted group or individual video education with verbal and written material and guidebook.  Patient learns to use a non-linear approach by alternating intensity levels and lengths of time spent  exercising to help burn more calories and lose more body fat. Cardiovascular exercise helps improve heart health, metabolism, hormonal balance, blood sugar control, and recovery from fatigue. Resistance training improves strength, endurance, balance, coordination, reaction time, metabolism, and muscle mass. Flexibility exercise improves circulation, posture, and balance. Seek guidance from your physician and exercise physiologist before implementing an exercise routine and learn your capabilities and proper form for all exercise.  Introduction to Yoga  Clinical  staff conducted group or individual video education with verbal and written material and guidebook.  Patient learns about yoga, a discipline of the coming together of mind, breath, and body. The benefits of yoga include improved flexibility, improved range of motion, better posture and core strength, increased lung function, weight loss, and positive self-image. Yoga's heart health benefits include lowered blood pressure, healthier heart rate, decreased cholesterol and triglyceride levels, improved immune function, and reduced stress. Seek guidance from your physician and exercise physiologist before implementing an exercise routine and learn your capabilities and proper form for all exercise.  Medical   Aging: Enhancing Your Quality of Life  Clinical staff conducted group or individual video education with verbal and written material and guidebook.  Patient learns key strategies and recommendations to stay in good physical health and enhance quality of life, such as prevention strategies, having an advocate, securing a Health Care Proxy and Power of Attorney, and keeping a list of medications and system for tracking them. It also discusses how to avoid risk for bone loss.  Biology of Weight Control  Clinical staff conducted group or individual video education with verbal and written material and guidebook.  Patient learns that weight gain occurs because we consume more calories than we burn (eating more, moving less). Even if your body weight is normal, you may have higher ratios of fat compared to muscle mass. Too much body fat puts you at increased risk for cardiovascular disease, heart attack, stroke, type 2 diabetes, and obesity-related cancers. In addition to exercise, following the Pritikin Eating Plan can help reduce your risk.  Decoding Lab Results  Clinical staff conducted group or individual video education with verbal and written material and guidebook.  Patient learns that lab test  reflects one measurement whose values change over time and are influenced by many factors, including medication, stress, sleep, exercise, food, hydration, pre-existing medical conditions, and more. It is recommended to use the knowledge from this video to become more involved with your lab results and evaluate your numbers to speak with your doctor.   Diseases of Our Time - Overview  Clinical staff conducted group or individual video education with verbal and written material and guidebook.  Patient learns that according to the CDC, 50% to 70% of chronic diseases (such as obesity, type 2 diabetes, elevated lipids, hypertension, and heart disease) are avoidable through lifestyle improvements including healthier food choices, listening to satiety cues, and increased physical activity.  Sleep Disorders Clinical staff conducted group or individual video education with verbal and written material and guidebook.  Patient learns how good quality and duration of sleep are important to overall health and well-being. Patient also learns about sleep disorders and how they impact health along with recommendations to address them, including discussing with a physician.  Nutrition  Dining Out - Part 2 Clinical staff conducted group or individual video education with verbal and written material and guidebook.  Patient learns how to plan ahead and communicate in order to maximize their dining experience in a healthy and nutritious manner. Included  are recommended food choices based on the type of restaurant the patient is visiting.   Fueling a Banker conducted group or individual video education with verbal and written material and guidebook.  There is a strong connection between our food choices and our health. Diseases like obesity and type 2 diabetes are very prevalent and are in large-part due to lifestyle choices. The Pritikin Eating Plan provides plenty of food and hunger-curbing  satisfaction. It is easy to follow, affordable, and helps reduce health risks.  Menu Workshop  Clinical staff conducted group or individual video education with verbal and written material and guidebook.  Patient learns that restaurant meals can sabotage health goals because they are often packed with calories, fat, sodium, and sugar. Recommendations include strategies to plan ahead and to communicate with the manager, chef, or server to help order a healthier meal.  Planning Your Eating Strategy  Clinical staff conducted group or individual video education with verbal and written material and guidebook.  Patient learns about the Pritikin Eating Plan and its benefit of reducing the risk of disease. The Pritikin Eating Plan does not focus on calories. Instead, it emphasizes high-quality, nutrient-rich foods. By knowing the characteristics of the foods, we choose, we can determine their calorie density and make informed decisions.  Targeting Your Nutrition Priorities  Clinical staff conducted group or individual video education with verbal and written material and guidebook.  Patient learns that lifestyle habits have a tremendous impact on disease risk and progression. This video provides eating and physical activity recommendations based on your personal health goals, such as reducing LDL cholesterol, losing weight, preventing or controlling type 2 diabetes, and reducing high blood pressure.  Vitamins and Minerals  Clinical staff conducted group or individual video education with verbal and written material and guidebook.  Patient learns different ways to obtain key vitamins and minerals, including through a recommended healthy diet. It is important to discuss all supplements you take with your doctor.   Healthy Mind-Set    Smoking Cessation  Clinical staff conducted group or individual video education with verbal and written material and guidebook.  Patient learns that cigarette smoking and  tobacco addiction pose a serious health risk which affects millions of people. Stopping smoking will significantly reduce the risk of heart disease, lung disease, and many forms of cancer. Recommended strategies for quitting are covered, including working with your doctor to develop a successful plan.  Culinary   Becoming a Set Designer conducted group or individual video education with verbal and written material and guidebook.  Patient learns that cooking at home can be healthy, cost-effective, quick, and puts them in control. Keys to cooking healthy recipes will include looking at your recipe, assessing your equipment needs, planning ahead, making it simple, choosing cost-effective seasonal ingredients, and limiting the use of added fats, salts, and sugars.  Cooking - Breakfast and Snacks  Clinical staff conducted group or individual video education with verbal and written material and guidebook.  Patient learns how important breakfast is to satiety and nutrition through the entire day. Recommendations include key foods to eat during breakfast to help stabilize blood sugar levels and to prevent overeating at meals later in the day. Planning ahead is also a key component.  Cooking - Educational Psychologist conducted group or individual video education with verbal and written material and guidebook.  Patient learns eating strategies to improve overall health, including an approach to cook more at home.  Recommendations include thinking of animal protein as a side on your plate rather than center stage and focusing instead on lower calorie dense options like vegetables, fruits, whole grains, and plant-based proteins, such as beans. Making sauces in large quantities to freeze for later and leaving the skin on your vegetables are also recommended to maximize your experience.  Cooking - Healthy Salads and Dressing Clinical staff conducted group or individual video education with  verbal and written material and guidebook.  Patient learns that vegetables, fruits, whole grains, and legumes are the foundations of the Pritikin Eating Plan. Recommendations include how to incorporate each of these in flavorful and healthy salads, and how to create homemade salad dressings. Proper handling of ingredients is also covered. Cooking - Soups and [employer] - Soups and Desserts Clinical staff conducted group or individual video education with verbal and written material and guidebook.  Patient learns that Pritikin soups and desserts make for easy, nutritious, and delicious snacks and meal components that are low in sodium, fat, sugar, and calorie density, while high in vitamins, minerals, and filling fiber. Recommendations include simple and healthy ideas for soups and desserts.   Overview     The Pritikin Solution Program Overview Clinical staff conducted group or individual video education with verbal and written material and guidebook.  Patient learns that the results of the Pritikin Program have been documented in more than 100 articles published in peer-reviewed journals, and the benefits include reducing risk factors for (and, in some cases, even reversing) high cholesterol, high blood pressure, type 2 diabetes, obesity, and more! An overview of the three key pillars of the Pritikin Program will be covered: eating well, doing regular exercise, and having a healthy mind-set.  WORKSHOPS  Exercise: Exercise Basics: Building Your Action Plan Clinical staff led group instruction and group discussion with PowerPoint presentation and patient guidebook. To enhance the learning environment the use of posters, models and videos may be added. At the conclusion of this workshop, patients will comprehend the difference between physical activity and exercise, as well as the benefits of incorporating both, into their routine. Patients will understand the FITT (Frequency, Intensity, Time,  and Type) principle and how to use it to build an exercise action plan. In addition, safety concerns and other considerations for exercise and cardiac rehab will be addressed by the presenter. The purpose of this lesson is to promote a comprehensive and effective weekly exercise routine in order to improve patients' overall level of fitness.   Managing Heart Disease: Your Path to a Healthier Heart Clinical staff led group instruction and group discussion with PowerPoint presentation and patient guidebook. To enhance the learning environment the use of posters, models and videos may be added.At the conclusion of this workshop, patients will understand the anatomy and physiology of the heart. Additionally, they will understand how Pritikin's three pillars impact the risk factors, the progression, and the management of heart disease.  The purpose of this lesson is to provide a high-level overview of the heart, heart disease, and how the Pritikin lifestyle positively impacts risk factors.  Exercise Biomechanics Clinical staff led group instruction and group discussion with PowerPoint presentation and patient guidebook. To enhance the learning environment the use of posters, models and videos may be added. Patients will learn how the structural parts of their bodies function and how these functions impact their daily activities, movement, and exercise. Patients will learn how to promote a neutral spine, learn how to manage pain, and identify ways to  improve their physical movement in order to promote healthy living. The purpose of this lesson is to expose patients to common physical limitations that impact physical activity. Participants will learn practical ways to adapt and manage aches and pains, and to minimize their effect on regular exercise. Patients will learn how to maintain good posture while sitting, walking, and lifting.  Balance Training and Fall Prevention  Clinical staff led group  instruction and group discussion with PowerPoint presentation and patient guidebook. To enhance the learning environment the use of posters, models and videos may be added. At the conclusion of this workshop, patients will understand the importance of their sensorimotor skills (vision, proprioception, and the vestibular system) in maintaining their ability to balance as they age. Patients will apply a variety of balancing exercises that are appropriate for their current level of function. Patients will understand the common causes for poor balance, possible solutions to these problems, and ways to modify their physical environment in order to minimize their fall risk. The purpose of this lesson is to teach patients about the importance of maintaining balance as they age and ways to minimize their risk of falling.  WORKSHOPS   Nutrition:  Fueling a Ship Broker led group instruction and group discussion with PowerPoint presentation and patient guidebook. To enhance the learning environment the use of posters, models and videos may be added. Patients will review the foundational principles of the Pritikin Eating Plan and understand what constitutes a serving size in each of the food groups. Patients will also learn Pritikin-friendly foods that are better choices when away from home and review make-ahead meal and snack options. Calorie density will be reviewed and applied to three nutrition priorities: weight maintenance, weight loss, and weight gain. The purpose of this lesson is to reinforce (in a group setting) the key concepts around what patients are recommended to eat and how to apply these guidelines when away from home by planning and selecting Pritikin-friendly options. Patients will understand how calorie density may be adjusted for different weight management goals.  Mindful Eating  Clinical staff led group instruction and group discussion with PowerPoint presentation and patient  guidebook. To enhance the learning environment the use of posters, models and videos may be added. Patients will briefly review the concepts of the Pritikin Eating Plan and the importance of low-calorie dense foods. The concept of mindful eating will be introduced as well as the importance of paying attention to internal hunger signals. Triggers for non-hunger eating and techniques for dealing with triggers will be explored. The purpose of this lesson is to provide patients with the opportunity to review the basic principles of the Pritikin Eating Plan, discuss the value of eating mindfully and how to measure internal cues of hunger and fullness using the Hunger Scale. Patients will also discuss reasons for non-hunger eating and learn strategies to use for controlling emotional eating.  Targeting Your Nutrition Priorities Clinical staff led group instruction and group discussion with PowerPoint presentation and patient guidebook. To enhance the learning environment the use of posters, models and videos may be added. Patients will learn how to determine their genetic susceptibility to disease by reviewing their family history. Patients will gain insight into the importance of diet as part of an overall healthy lifestyle in mitigating the impact of genetics and other environmental insults. The purpose of this lesson is to provide patients with the opportunity to assess their personal nutrition priorities by looking at their family history, their own health history  and current risk factors. Patients will also be able to discuss ways of prioritizing and modifying the Pritikin Eating Plan for their highest risk areas  Menu  Clinical staff led group instruction and group discussion with PowerPoint presentation and patient guidebook. To enhance the learning environment the use of posters, models and videos may be added. Using menus brought in from [employer], or printed from [employer], patients will apply  the Pritikin dining out guidelines that were presented in the [employer] video. Patients will also be able to practice these guidelines in a variety of provided scenarios. The purpose of this lesson is to provide patients with the opportunity to practice hands-on learning of the Pritikin Dining Out guidelines with actual menus and practice scenarios.  Label Reading Clinical staff led group instruction and group discussion with PowerPoint presentation and patient guidebook. To enhance the learning environment the use of posters, models and videos may be added. Patients will review and discuss the Pritikin label reading guidelines presented in Pritikin's Label Reading Educational series video. Using fool labels brought in from local grocery stores and markets, patients will apply the label reading guidelines and determine if the packaged food meet the Pritikin guidelines. The purpose of this lesson is to provide patients with the opportunity to review, discuss, and practice hands-on learning of the Pritikin Label Reading guidelines with actual packaged food labels. Cooking School  Pritikin's [employer] are designed to teach patients ways to prepare quick, simple, and affordable recipes at home. The importance of nutrition's role in chronic disease risk reduction is reflected in its emphasis in the overall Pritikin program. By learning how to prepare essential core Pritikin Eating Plan recipes, patients will increase control over what they eat; be able to customize the flavor of foods without the use of added salt, sugar, or fat; and improve the quality of the food they consume. By learning a set of core recipes which are easily assembled, quickly prepared, and affordable, patients are more likely to prepare more healthy foods at home. These workshops focus on convenient breakfasts, simple entres, side dishes, and desserts which can be prepared with minimal effort and are  consistent with nutrition recommendations for cardiovascular risk reduction. Cooking [employer] are taught by a armed forces logistics/support/administrative officer (RD) who has been trained by the [employer]. The chef or RD has a clear understanding of the importance of minimizing - if not completely eliminating - added fat, sugar, and sodium in recipes. Throughout the series of Cooking School Workshop sessions, patients will learn about healthy ingredients and efficient methods of cooking to build confidence in their capability to prepare    Cooking School weekly topics:  Adding Flavor- Sodium-Free  Fast and Healthy Breakfasts  Powerhouse Plant-Based Proteins  Satisfying Salads and Dressings  Simple Sides and Sauces  International Cuisine-Spotlight on the [employer] Zones  Delicious Desserts  Savory Soups  [employer] - Meals in a Astronomer Appetizers and Snacks  Comforting Weekend Breakfasts  One-Pot Wonders   Fast Evening Meals  Landscape Architect Your Pritikin Plate  WORKSHOPS   Healthy Mindset (Psychosocial):  Focused Goals, Sustainable Changes Clinical staff led group instruction and group discussion with PowerPoint presentation and patient guidebook. To enhance the learning environment the use of posters, models and videos may be added. Patients will be able to apply effective goal setting strategies to establish at least one personal goal, and then take consistent, meaningful action toward that goal.  They will learn to identify common barriers to achieving personal goals and develop strategies to overcome them. Patients will also gain an understanding of how our mind-set can impact our ability to achieve goals and the importance of cultivating a positive and growth-oriented mind-set. The purpose of this lesson is to provide patients with a deeper understanding of how to set and achieve personal goals, as well as the tools and strategies needed to overcome common  obstacles which may arise along the way.  From Head to Heart: The Power of a Healthy Outlook  Clinical staff led group instruction and group discussion with PowerPoint presentation and patient guidebook. To enhance the learning environment the use of posters, models and videos may be added. Patients will be able to recognize and describe the impact of emotions and mood on physical health. They will discover the importance of self-care and explore self-care practices which may work for them. Patients will also learn how to utilize the 4 C's to cultivate a healthier outlook and better manage stress and challenges. The purpose of this lesson is to demonstrate to patients how a healthy outlook is an essential part of maintaining good health, especially as they continue their cardiac rehab journey.  Healthy Sleep for a Healthy Heart Clinical staff led group instruction and group discussion with PowerPoint presentation and patient guidebook. To enhance the learning environment the use of posters, models and videos may be added. At the conclusion of this workshop, patients will be able to demonstrate knowledge of the importance of sleep to overall health, well-being, and quality of life. They will understand the symptoms of, and treatments for, common sleep disorders. Patients will also be able to identify daytime and nighttime behaviors which impact sleep, and they will be able to apply these tools to help manage sleep-related challenges. The purpose of this lesson is to provide patients with a general overview of sleep and outline the importance of quality sleep. Patients will learn about a few of the most common sleep disorders. Patients will also be introduced to the concept of "sleep hygiene," and discover ways to self-manage certain sleeping problems through simple daily behavior changes. Finally, the workshop will motivate patients by clarifying the links between quality sleep and their goals of heart-healthy  living.   Recognizing and Reducing Stress Clinical staff led group instruction and group discussion with PowerPoint presentation and patient guidebook. To enhance the learning environment the use of posters, models and videos may be added. At the conclusion of this workshop, patients will be able to understand the types of stress reactions, differentiate between acute and chronic stress, and recognize the impact that chronic stress has on their health. They will also be able to apply different coping mechanisms, such as reframing negative self-talk. Patients will have the opportunity to practice a variety of stress management techniques, such as deep abdominal breathing, progressive muscle relaxation, and/or guided imagery.  The purpose of this lesson is to educate patients on the role of stress in their lives and to provide healthy techniques for coping with it.  Learning Barriers/Preferences:  Learning Barriers/Preferences - 09/18/23 1018       Learning Barriers/Preferences   Learning Barriers Sight;Hearing   wears glasses and bilateral hearing aids   Learning Preferences Skilled Demonstration;Individual Instruction;Group Instruction             Education Topics:  Knowledge Questionnaire Score:  Knowledge Questionnaire Score - 09/18/23 1019       Knowledge Questionnaire Score   Pre Score  23/24             Core Components/Risk Factors/Patient Goals at Admission:  Personal Goals and Risk Factors at Admission - 09/18/23 1022       Core Components/Risk Factors/Patient Goals on Admission    Weight Management Yes;Weight Loss    Intervention Weight Management: Develop a combined nutrition and exercise program designed to reach desired caloric intake, while maintaining appropriate intake of nutrient and fiber, sodium and fats, and appropriate energy expenditure required for the weight goal.;Weight Management: Provide education and appropriate resources to help participant work on  and attain dietary goals.;Weight Management/Obesity: Establish reasonable short term and long term weight goals.    Expected Outcomes Short Term: Continue to assess and modify interventions until short term weight is achieved;Long Term: Adherence to nutrition and physical activity/exercise program aimed toward attainment of established weight goal;Weight Loss: Understanding of general recommendations for a balanced deficit meal plan, which promotes 1-2 lb weight loss per week and includes a negative energy balance of [phone] kcal/d;Understanding recommendations for meals to include 15-35% energy as protein, 25-35% energy from fat, 35-60% energy from carbohydrates, less than 200mg  of dietary cholesterol, 20-35 gm of total fiber daily;Understanding of distribution of calorie intake throughout the day with the consumption of 4-5 meals/snacks    Hypertension Yes    Intervention Provide education on lifestyle modifcations including regular physical activity/exercise, weight management, moderate sodium restriction and increased consumption of fresh fruit, vegetables, and low fat dairy, alcohol moderation, and smoking cessation.;Monitor prescription use compliance.    Expected Outcomes Short Term: Continued assessment and intervention until BP is < 140/23mm HG in hypertensive participants. < 130/73mm HG in hypertensive participants with diabetes, heart failure or chronic kidney disease.;Long Term: Maintenance of blood pressure at goal levels.    Lipids Yes    Intervention Provide education and support for participant on nutrition & aerobic/resistive exercise along with prescribed medications to achieve LDL 70mg , HDL >40mg .    Expected Outcomes Short Term: Participant states understanding of desired cholesterol values and is compliant with medications prescribed. Participant is following exercise prescription and nutrition guidelines.;Long Term: Cholesterol controlled with medications as prescribed, with  individualized exercise RX and with personalized nutrition plan. Value goals: LDL < 70mg , HDL > 40 mg.    Stress Yes    Intervention Offer individual and/or small group education and counseling on adjustment to heart disease, stress management and health-related lifestyle change. Teach and support self-help strategies.    Expected Outcomes Short Term: Participant demonstrates changes in health-related behavior, relaxation and other stress management skills, ability to obtain effective social support, and compliance with psychotropic medications if prescribed.;Long Term: Emotional wellbeing is indicated by absence of clinically significant psychosocial distress or social isolation.             Core Components/Risk Factors/Patient Goals Review:    Core Components/Risk Factors/Patient Goals at Discharge (Final Review):    ITP Comments:  ITP Comments     Row Name 09/18/23 0929           ITP Comments Wilbert Bihari, MD: Medical Director.  Introduction to the Pritikin Education Program/Intensive Cardiac Rehab.  Initial orientation packet reviewed with the patient today.                Comments: Participant attended orientation for the cardiac rehabilitation program on  09/18/2023  to perform initial intake and exercise walk test. Patient introduced to the Pritikin Program education and orientation packet was reviewed. Completed 6-minute walk test, measurements, initial  ITP, and exercise prescription. Vital signs stable. Telemetry-A Paced, asymptomatic.   Service time was from 7:55 to 9:38.

## 2023-09-22 ENCOUNTER — Other Ambulatory Visit (HOSPITAL_COMMUNITY): Payer: Self-pay

## 2023-09-24 ENCOUNTER — Encounter (HOSPITAL_COMMUNITY)
Admission: RE | Admit: 2023-09-24 | Discharge: 2023-09-24 | Disposition: A | Discharge status: Home / Self Care | Payer: PPO | Source: Ambulatory Visit | Attending: Cardiology

## 2023-09-24 DIAGNOSIS — I214 Non-ST elevation (NSTEMI) myocardial infarction: Secondary | ICD-10-CM

## 2023-09-24 DIAGNOSIS — Z48812 Encounter for surgical aftercare following surgery on the circulatory system: Secondary | ICD-10-CM | POA: Diagnosis not present

## 2023-09-24 DIAGNOSIS — Z955 Presence of coronary angioplasty implant and graft: Secondary | ICD-10-CM

## 2023-09-24 NOTE — Progress Notes (Addendum)
 Daily Session Note  Patient Details  Name: Charles Ellison. MRN: [id number] Date of Birth: 1950-10-13 Referring Provider:   Flowsheet Row INTENSIVE CARDIAC REHAB ORIENT from 09/18/2023 in [facility]  Referring Provider Knox Perl, MD       Encounter Date: 09/24/2023  Check In:  Session Check In - 09/24/23 0807       Check-In   Supervising physician immediately available to respond to emergencies [facility] MD immediately available    Physician(s) Slater Duncan, NP    Location [facility]    Staff Present Rosezena Contes, MS, ACSM-CEP, CCRP, Exercise Physiologist;Maria Whitaker, RN, Emerick Hanlon, MS, Exercise Physiologist;Johnny Alexia Angelucci, MS, Exercise Physiologist;Olinty Gaylene Kays, MS, ACSM-CEP, Exercise Physiologist;Jetta Walker BS, ACSM-CEP, Exercise Physiologist    Virtual Visit No    Medication changes reported     No    Fall or balance concerns reported    No    Tobacco Cessation No Change    Warm-up and Cool-down Performed as group-led instruction    Resistance Training Performed Yes    VAD Patient? No    PAD/SET Patient? No      Pain Assessment   Currently in Pain? No/denies    Pain Score 0-No pain    Multiple Pain Sites No             Capillary Blood Glucose: No results found for this or any previous visit (from the past 24 hours).   Exercise Prescription Changes - 09/24/23 1100       Response to Exercise   Blood Pressure (Admit) 110/68    Blood Pressure (Exercise) 142/64    Blood Pressure (Exit) 120/70    Heart Rate (Admit) 61 bpm    Heart Rate (Exercise) 98 bpm    Heart Rate (Exit) 63 bpm    Rating of Perceived Exertion (Exercise) 11    Symptoms None    Comments Pt's first day in the [facility] program    Duration Continue with 30 min of aerobic exercise without signs/symptoms of physical distress.    Intensity THRR unchanged      Progression   Progression Continue to progress  workloads to maintain intensity without signs/symptoms of physical distress.    Average METs 3.85      Resistance Training   Training Prescription Yes    Weight 4 lbs    Reps 10-15    Time 10 Minutes      Interval Training   Interval Training No      Bike   Level 2    Watts 37    Minutes 15    METs 3.3      Recumbant Elliptical   Level 2    RPM 77    Watts 104    Minutes 15    METs 4.4             Social History   Tobacco Use  Smoking Status Never  Smokeless Tobacco Never    Goals Met:  Exercise tolerated well No report of concerns or symptoms today Strength training completed today  Goals Unmet:  Not Applicable  Comments: [name] started cardiac rehab today. Pt tolerated light exercise without difficulty. VSS, telemetry V-paced.  Medication list reconciled. Pt denies barriers to medicaiton compliance. PSYCHOSOCIAL ASSESSMENT:  PHQ 2 & 9 scores are 2 & 6. Pt has attended therapy in the past but does not have any appointments scheduled. He stated he has good support  from his wife, Charles Ellison. He denies any needs or interventions at this time. Pt enjoys fishing, reading, and spending time with family and friends. Pt oriented to exercise equipment and routine. Understanding verbalized.     QUALITY OF LIFE SCORE REVIEW  Pt completed Quality of Life survey as a participant in Cardiac Rehab. Scores 21.0 or below are considered low. Overall 24.56, Health and Function 25.5, socioeconomic 26.21, physiological and spiritual 19.57, family 26.4. Patient quality of life slightly altered by physical constraints which limits ability to perform as prior to recent cardiac event. Will continue to monitor, re-evaluate, and intervene as necessary.       Dr. Gaylyn Keas is Medical Director for Cardiac Rehab at [facility].

## 2023-09-26 ENCOUNTER — Encounter (HOSPITAL_COMMUNITY)
Admission: RE | Admit: 2023-09-26 | Discharge: 2023-09-26 | Disposition: A | Discharge status: Home / Self Care | Payer: PPO | Source: Ambulatory Visit | Attending: Cardiology | Admitting: Cardiology

## 2023-09-26 DIAGNOSIS — Z48812 Encounter for surgical aftercare following surgery on the circulatory system: Secondary | ICD-10-CM | POA: Diagnosis not present

## 2023-09-26 DIAGNOSIS — I214 Non-ST elevation (NSTEMI) myocardial infarction: Secondary | ICD-10-CM

## 2023-09-26 DIAGNOSIS — Z955 Presence of coronary angioplasty implant and graft: Secondary | ICD-10-CM

## 2023-09-28 ENCOUNTER — Encounter (HOSPITAL_COMMUNITY)
Admission: RE | Admit: 2023-09-28 | Discharge: 2023-09-28 | Disposition: A | Discharge status: Home / Self Care | Payer: PPO | Source: Ambulatory Visit | Attending: Cardiology | Admitting: Cardiology

## 2023-09-28 DIAGNOSIS — Z955 Presence of coronary angioplasty implant and graft: Secondary | ICD-10-CM

## 2023-09-28 DIAGNOSIS — Z48812 Encounter for surgical aftercare following surgery on the circulatory system: Secondary | ICD-10-CM | POA: Diagnosis not present

## 2023-09-28 DIAGNOSIS — I214 Non-ST elevation (NSTEMI) myocardial infarction: Secondary | ICD-10-CM

## 2023-10-01 ENCOUNTER — Encounter (HOSPITAL_COMMUNITY)
Admission: RE | Admit: 2023-10-01 | Discharge: 2023-10-01 | Disposition: A | Discharge status: Home / Self Care | Payer: PPO | Source: Ambulatory Visit | Attending: Cardiology

## 2023-10-01 DIAGNOSIS — Z955 Presence of coronary angioplasty implant and graft: Secondary | ICD-10-CM

## 2023-10-01 DIAGNOSIS — Z48812 Encounter for surgical aftercare following surgery on the circulatory system: Secondary | ICD-10-CM | POA: Diagnosis not present

## 2023-10-01 DIAGNOSIS — I214 Non-ST elevation (NSTEMI) myocardial infarction: Secondary | ICD-10-CM

## 2023-10-02 ENCOUNTER — Telehealth (HOSPITAL_COMMUNITY): Payer: Self-pay

## 2023-10-03 ENCOUNTER — Encounter (HOSPITAL_COMMUNITY)
Admission: RE | Admit: 2023-10-03 | Discharge: 2023-10-03 | Disposition: A | Discharge status: Home / Self Care | Payer: PPO | Source: Ambulatory Visit | Attending: Cardiology

## 2023-10-03 DIAGNOSIS — Z955 Presence of coronary angioplasty implant and graft: Secondary | ICD-10-CM

## 2023-10-03 DIAGNOSIS — Z48812 Encounter for surgical aftercare following surgery on the circulatory system: Secondary | ICD-10-CM | POA: Diagnosis not present

## 2023-10-03 DIAGNOSIS — I214 Non-ST elevation (NSTEMI) myocardial infarction: Secondary | ICD-10-CM

## 2023-10-04 DIAGNOSIS — L821 Other seborrheic keratosis: Secondary | ICD-10-CM | POA: Diagnosis not present

## 2023-10-04 DIAGNOSIS — D225 Melanocytic nevi of trunk: Secondary | ICD-10-CM | POA: Diagnosis not present

## 2023-10-04 DIAGNOSIS — L814 Other melanin hyperpigmentation: Secondary | ICD-10-CM | POA: Diagnosis not present

## 2023-10-04 DIAGNOSIS — D492 Neoplasm of unspecified behavior of bone, soft tissue, and skin: Secondary | ICD-10-CM | POA: Diagnosis not present

## 2023-10-05 ENCOUNTER — Encounter (HOSPITAL_COMMUNITY)
Admission: RE | Admit: 2023-10-05 | Discharge: 2023-10-05 | Disposition: A | Discharge status: Home / Self Care | Payer: PPO | Source: Ambulatory Visit | Attending: Cardiology | Admitting: Cardiology

## 2023-10-05 DIAGNOSIS — Z48812 Encounter for surgical aftercare following surgery on the circulatory system: Secondary | ICD-10-CM | POA: Diagnosis not present

## 2023-10-05 DIAGNOSIS — I214 Non-ST elevation (NSTEMI) myocardial infarction: Secondary | ICD-10-CM

## 2023-10-05 DIAGNOSIS — Z955 Presence of coronary angioplasty implant and graft: Secondary | ICD-10-CM

## 2023-10-08 ENCOUNTER — Encounter (HOSPITAL_COMMUNITY)
Admission: RE | Admit: 2023-10-08 | Discharge: 2023-10-08 | Disposition: A | Discharge status: Home / Self Care | Payer: PPO | Source: Ambulatory Visit | Attending: Cardiology

## 2023-10-08 DIAGNOSIS — Z955 Presence of coronary angioplasty implant and graft: Secondary | ICD-10-CM

## 2023-10-08 DIAGNOSIS — I214 Non-ST elevation (NSTEMI) myocardial infarction: Secondary | ICD-10-CM

## 2023-10-08 DIAGNOSIS — Z48812 Encounter for surgical aftercare following surgery on the circulatory system: Secondary | ICD-10-CM | POA: Diagnosis not present

## 2023-10-10 ENCOUNTER — Encounter (HOSPITAL_COMMUNITY)
Admission: RE | Admit: 2023-10-10 | Discharge: 2023-10-10 | Disposition: A | Discharge status: Home / Self Care | Payer: PPO | Source: Ambulatory Visit | Attending: Cardiology | Admitting: Cardiology

## 2023-10-10 DIAGNOSIS — Z955 Presence of coronary angioplasty implant and graft: Secondary | ICD-10-CM

## 2023-10-10 DIAGNOSIS — I214 Non-ST elevation (NSTEMI) myocardial infarction: Secondary | ICD-10-CM

## 2023-10-10 DIAGNOSIS — Z48812 Encounter for surgical aftercare following surgery on the circulatory system: Secondary | ICD-10-CM | POA: Diagnosis not present

## 2023-10-12 ENCOUNTER — Encounter (HOSPITAL_COMMUNITY)
Admission: RE | Admit: 2023-10-12 | Discharge: 2023-10-12 | Disposition: A | Discharge status: Home / Self Care | Payer: PPO | Source: Ambulatory Visit | Attending: Cardiology | Admitting: Cardiology

## 2023-10-12 DIAGNOSIS — Z955 Presence of coronary angioplasty implant and graft: Secondary | ICD-10-CM

## 2023-10-12 DIAGNOSIS — Z48812 Encounter for surgical aftercare following surgery on the circulatory system: Secondary | ICD-10-CM | POA: Diagnosis not present

## 2023-10-12 DIAGNOSIS — I214 Non-ST elevation (NSTEMI) myocardial infarction: Secondary | ICD-10-CM

## 2023-10-15 ENCOUNTER — Encounter (HOSPITAL_COMMUNITY)
Admission: RE | Admit: 2023-10-15 | Discharge: 2023-10-15 | Disposition: A | Discharge status: Home / Self Care | Payer: PPO | Source: Ambulatory Visit | Attending: Cardiology | Admitting: Cardiology

## 2023-10-15 DIAGNOSIS — I252 Old myocardial infarction: Secondary | ICD-10-CM | POA: Insufficient documentation

## 2023-10-15 DIAGNOSIS — Z48812 Encounter for surgical aftercare following surgery on the circulatory system: Secondary | ICD-10-CM | POA: Diagnosis not present

## 2023-10-15 DIAGNOSIS — I214 Non-ST elevation (NSTEMI) myocardial infarction: Secondary | ICD-10-CM

## 2023-10-15 DIAGNOSIS — Z955 Presence of coronary angioplasty implant and graft: Secondary | ICD-10-CM | POA: Diagnosis not present

## 2023-10-16 ENCOUNTER — Ambulatory Visit (INDEPENDENT_AMBULATORY_CARE_PROVIDER_SITE_OTHER): Payer: PPO

## 2023-10-16 DIAGNOSIS — I495 Sick sinus syndrome: Secondary | ICD-10-CM | POA: Diagnosis not present

## 2023-10-16 NOTE — Progress Notes (Signed)
 Cardiac Individual Treatment Plan  Patient Details  Name: Charles Ellison. MRN: [id number] Date of Birth: 06-30-1951 Referring Provider:   Flowsheet Row INTENSIVE CARDIAC REHAB ORIENT from 09/18/2023 in [facility]  Referring Provider Yates Decamp, MD       Initial Encounter Date:  Flowsheet Row INTENSIVE CARDIAC REHAB ORIENT from 09/18/2023 in [facility] for Heart, Vascular, & Lung Health  Date 09/18/23       Visit Diagnosis: 08/24/23 NSTEMI (non-ST elevated myocardial infarction) (HCC)  08/24/23 Status post coronary artery stent placement  Patient's Home Medications on Admission:  Current Outpatient Medications:    aspirin EC 81 MG tablet, Take 1 tablet (81 mg total) by mouth daily. Swallow whole., Disp: , Rfl:    atorvastatin (LIPITOR) 80 MG tablet, Take 1 tablet (80 mg total) by mouth daily., Disp: 30 tablet, Rfl: 11   Collagen-Vitamin C-Biotin (COLLAGEN PO), Take 1 capsule by mouth daily with breakfast. (Patient not taking: Reported on 09/18/2023), Disp: , Rfl:    Glucosamine-Chondroit-Vit C-Mn (GLUCOSAMINE-CHONDROITIN) TABS, Take 1 tablet by mouth daily with breakfast. (Patient not taking: Reported on 09/18/2023), Disp: , Rfl:    metoprolol succinate (TOPROL-XL) 100 MG 24 hr tablet, TAKE 1 TABLET(100 MG) BY MOUTH DAILY, Disp: 30 tablet, Rfl: 0   nitroGLYCERIN (NITROSTAT) 0.4 MG SL tablet, Place 1 tablet (0.4 mg total) under the tongue every 5 (five) minutes as needed for chest pain., Disp: 25 tablet, Rfl: 3   Omega-3 Fatty Acids (FISH OIL PO), Take 1 capsule by mouth daily with breakfast. (Patient not taking: Reported on 09/18/2023), Disp: , Rfl:    PEPCID COMPLETE 10-800-165 MG chewable tablet, Chew 1 tablet by mouth 2 (two) times daily as needed (for heartburn/indigestion). (Patient not taking: Reported on 09/18/2023), Disp: , Rfl:    ticagrelor (BRILINTA) 90 MG TABS tablet, Take 1 tablet (90 mg total) by  mouth 2 (two) times daily., Disp: 60 tablet, Rfl: 11  Past Medical History: Past Medical History:  Diagnosis Date   Essential hypertension    Sinus arrest 12/27/2016   Hattie Perch 12/27/2016    Tobacco Use: Social History   Tobacco Use  Smoking Status Never  Smokeless Tobacco Never    Labs: Review Flowsheet        No data to display          Capillary Blood Glucose: No results found for: "GLUCAP"   Exercise Target Goals: Exercise Program Goal: Individual exercise prescription set using results from initial 6 min walk test and THRR while considering  patient's activity barriers and safety.   Exercise Prescription Goal: Initial exercise prescription builds to 30-45 minutes a day of aerobic activity, 2-3 days per week.  Home exercise guidelines will be given to patient during program as part of exercise prescription that the participant will acknowledge.  Activity Barriers & Risk Stratification:  Activity Barriers & Cardiac Risk Stratification - 09/18/23 0938       Activity Barriers & Cardiac Risk Stratification   Activity Barriers Chest Pain/Angina    Cardiac Risk Stratification High             6 Minute Walk:  6 Minute Walk     Row Name 09/18/23 0905         6 Minute Walk   Phase Initial     Distance 1836 feet     Walk Time 6 minutes     # of Rest Breaks 0  MPH 3.5     METS 3.9     RPE 13     Perceived Dyspnea  0     VO2 Peak 13.65     Symptoms No     Resting HR 63 bpm     Resting BP 118/70     Resting Oxygen Saturation  96 %     Exercise Oxygen Saturation  during 6 min walk 99 %     Max Ex. HR 103 bpm     Max Ex. BP 136/80     2 Minute Post BP 120/80              Oxygen Initial Assessment:   Oxygen Re-Evaluation:   Oxygen Discharge (Final Oxygen Re-Evaluation):   Initial Exercise Prescription:  Initial Exercise Prescription - 09/18/23 1000       Date of Initial Exercise RX and Referring Provider   Date 09/18/23     Referring Provider Yates Decamp, MD    Expected Discharge Date 12/12/23      Bike   Level 2    Watts 35    Minutes 15    METs 3.9      Recumbant Elliptical   Level 2    RPM 60    Watts 35    Minutes 15    METs 3.9      Prescription Details   Frequency (times per week) 3    Duration Progress to 30 minutes of continuous aerobic without signs/symptoms of physical distress      Intensity   THRR 40-80% of Max Heartrate 60-120    Ratings of Perceived Exertion 11-13    Perceived Dyspnea 0-4      Progression   Progression Continue progressive overload as per policy without signs/symptoms or physical distress.      Resistance Training   Training Prescription Yes    Weight 4 lbs    Reps 10-15             Perform Capillary Blood Glucose checks as needed.  Exercise Prescription Changes:   Exercise Prescription Changes     Row Name 09/24/23 1100 10/10/23 1015           Response to Exercise   Blood Pressure (Admit) 110/68 126/70      Blood Pressure (Exercise) 142/64 152/88      Blood Pressure (Exit) 120/70 105/58      Heart Rate (Admit) 61 bpm 63 bpm      Heart Rate (Exercise) 98 bpm 121 bpm      Heart Rate (Exit) 63 bpm 59 bpm      Rating of Perceived Exertion (Exercise) 11 11      Symptoms None None      Comments Pt's first day in the [facility] program Reviewed METs      Duration Continue with 30 min of aerobic exercise without signs/symptoms of physical distress. Continue with 30 min of aerobic exercise without signs/symptoms of physical distress.      Intensity THRR unchanged THRR unchanged        Progression   Progression Continue to progress workloads to maintain intensity without signs/symptoms of physical distress. Continue to progress workloads to maintain intensity without signs/symptoms of physical distress.      Average METs 3.85 4.85        Resistance Training   Training Prescription Yes No      Weight 4 lbs No weights on Wednesdays      Reps 10-15 --  Time 10 Minutes --        Interval Training   Interval Training No No        Bike   Level 2 2      Watts 37 67      Minutes 15 15      METs 3.3 4.4        Recumbant Elliptical   Level 2 3      RPM 77 85      Watts 104 129      Minutes 15 15      METs 4.4 5.3               Exercise Comments:   Exercise Comments     Row Name 09/24/23 1107 10/10/23 1015         Exercise Comments Pt's first day in the [facility] program. Pt exercised with no complaints and is off to a good start. Reviewed METs. Pt is making good progress with no complaints.               Exercise Goals and Review:   Exercise Goals     Row Name 09/18/23 [id number]             Exercise Goals   Increase Physical Activity Yes       Intervention Provide advice, education, support and counseling about physical activity/exercise needs.;Develop an individualized exercise prescription for aerobic and resistive training based on initial evaluation findings, risk stratification, comorbidities and participant's personal goals.       Expected Outcomes Short Term: Attend rehab on a regular basis to increase amount of physical activity.;Long Term: Exercising regularly at least 3-5 days a week.;Long Term: Add in home exercise to make exercise part of routine and to increase amount of physical activity.       Increase Strength and Stamina Yes       Intervention Develop an individualized exercise prescription for aerobic and resistive training based on initial evaluation findings, risk stratification, comorbidities and participant's personal goals.;Provide advice, education, support and counseling about physical activity/exercise needs.       Expected Outcomes Short Term: Increase workloads from initial exercise prescription for resistance, speed, and METs.;Short Term: Perform resistance training exercises routinely during rehab and add in resistance training at home;Long Term: Improve cardiorespiratory fitness, muscular  endurance and strength as measured by increased METs and functional capacity ( )       Able to understand and use rate of perceived exertion (RPE) scale Yes       Intervention Provide education and explanation on how to use RPE scale       Expected Outcomes Short Term: Able to use RPE daily in rehab to express subjective intensity level;Long Term:  Able to use RPE to guide intensity level when exercising independently       Knowledge and understanding of Target Heart Rate Range (THRR) Yes       Intervention Provide education and explanation of THRR including how the numbers were predicted and where they are located for reference       Expected Outcomes Short Term: Able to state/look up THRR;Long Term: Able to use THRR to govern intensity when exercising independently;Short Term: Able to use daily as guideline for intensity in rehab       Understanding of Exercise Prescription Yes       Intervention Provide education, explanation, and written materials on patient's individual exercise prescription       Expected Outcomes Short Term: Able  to explain program exercise prescription;Long Term: Able to explain home exercise prescription to exercise independently                Exercise Goals Re-Evaluation :  Exercise Goals Re-Evaluation     Row Name 09/24/23 1106             Exercise Goal Re-Evaluation   Exercise Goals Review Increase Physical Activity;Increase Strength and Stamina;Able to understand and use rate of perceived exertion (RPE) scale;Knowledge and understanding of Target Heart Rate Range (THRR);Understanding of Exercise Prescription       Comments Pt's first day in the [facility] program. Pt understands the exercise Rx, RPE scale and THRR.       Expected Outcomes Will continue to monitor patient and progress exercise workloads as tolerated.                Discharge Exercise Prescription (Final Exercise Prescription Changes):  Exercise Prescription Changes - 10/10/23 1015        Response to Exercise   Blood Pressure (Admit) 126/70    Blood Pressure (Exercise) 152/88    Blood Pressure (Exit) 105/58    Heart Rate (Admit) 63 bpm    Heart Rate (Exercise) 121 bpm    Heart Rate (Exit) 59 bpm    Rating of Perceived Exertion (Exercise) 11    Symptoms None    Comments Reviewed METs    Duration Continue with 30 min of aerobic exercise without signs/symptoms of physical distress.    Intensity THRR unchanged      Progression   Progression Continue to progress workloads to maintain intensity without signs/symptoms of physical distress.    Average METs 4.85      Resistance Training   Training Prescription No    Weight No weights on Wednesdays      Interval Training   Interval Training No      Bike   Level 2    Watts 67    Minutes 15    METs 4.4      Recumbant Elliptical   Level 3    RPM 85    Watts 129    Minutes 15    METs 5.3             Nutrition:  Target Goals: Understanding of nutrition guidelines, daily intake of sodium 1500mg , cholesterol 200mg , calories 30% from fat and 7% or less from saturated fats, daily to have 5 or more servings of fruits and vegetables.  Biometrics:  Pre Biometrics - 09/18/23 0810       Pre Biometrics   Waist Circumference 43 inches    Hip Circumference 40.5 inches    Waist to Hip Ratio 1.06 %    Triceps Skinfold 15 mm    % Body Fat 29.1 %    Grip Strength 46 kg    Flexibility 0 in   could not reach   Single Leg Stand 25.37 seconds              Nutrition Therapy Plan and Nutrition Goals:  Nutrition Therapy & Goals - 10/09/23 1417       Nutrition Therapy   Diet Heart Healthy Diet    Drug/Food Interactions Statins/Certain Fruits      Personal Nutrition Goals   Nutrition Goal Patient to identify strategies for reducing cardiovascular risk by attending the [facility] education and nutrition series weekly.   goal in progress.   Personal Goal #2 Patient to improve diet quality by using the plate  method  as a guide for meal planning to include lean protein/plant protein, fruits, vegetables, whole grains, nonfat dairy as part of a well-balanced diet.   goal in progress.   Personal Goal #3 Patient to identify strategies for weight loss with goal of 0.5-2.0# per week.   goal not met.   Comments Goals in progress. [name] continues to attend the [facility] education/nutrition series regularly. He has medical history of CAD s/p NSTEMI, hyperlipidemia, HTN, PAF. Lipids remain elevated. He has maintained his weight since starting with our program. Patient will benefit from participation in intensive cardiac rehab for nutrition, exercise, and lifestyle modification.      Intervention Plan   Intervention Prescribe, educate and counsel regarding individualized specific dietary modifications aiming towards targeted core components such as weight, hypertension, lipid management, diabetes, heart failure and other comorbidities.;Nutrition handout(s) given to patient.    Expected Outcomes Short Term Goal: Understand basic principles of dietary content, such as calories, fat, sodium, cholesterol and nutrients.;Long Term Goal: Adherence to prescribed nutrition plan.             Nutrition Assessments:  MEDIFICTS Score Key: >=70 Need to make dietary changes  40-70 Heart Healthy Diet <= 40 Therapeutic Level Cholesterol Diet   Flowsheet Row INTENSIVE CARDIAC REHAB from 09/24/2023 in [facility], & Lung [facility]  Picture Your Plate Total Score on Admission 68      Picture Your Plate Scores: <16 Unhealthy dietary pattern with much room for improvement. 41-50 Dietary pattern unlikely to meet recommendations for good health and room for improvement. 51-60 More healthful dietary pattern, with some room for improvement.  >60 Healthy dietary pattern, although there may be some specific behaviors that could be improved.    Nutrition Goals Re-Evaluation:  Nutrition  Goals Re-Evaluation     Row Name 10/09/23 1417             Goals   Current Weight 200 lb 9.9 oz (91 kg)       Comment Lpa WNL, cholesterol 243, HDL 52, LDL 157, triglycerides 187       Expected Outcome Goals in progress. [name] continues to attend the [facility] education/nutrition series regularly. He has medical history of CAD s/p NSTEMI, hyperlipidemia, HTN, PAF. Lipids remain elevated. He has maintained his weight since starting with our program. Patient will benefit from participation in intensive cardiac rehab for nutrition, exercise, and lifestyle modification.                Nutrition Goals Re-Evaluation:  Nutrition Goals Re-Evaluation     Row Name 10/09/23 1417             Goals   Current Weight 200 lb 9.9 oz (91 kg)       Comment Lpa WNL, cholesterol 243, HDL 52, LDL 157, triglycerides 187       Expected Outcome Goals in progress. [name] continues to attend the [facility] education/nutrition series regularly. He has medical history of CAD s/p NSTEMI, hyperlipidemia, HTN, PAF. Lipids remain elevated. He has maintained his weight since starting with our program. Patient will benefit from participation in intensive cardiac rehab for nutrition, exercise, and lifestyle modification.                Nutrition Goals Discharge (Final Nutrition Goals Re-Evaluation):  Nutrition Goals Re-Evaluation - 10/09/23 1417       Goals   Current Weight 200 lb 9.9 oz (91 kg)    Comment Lpa WNL, cholesterol 243, HDL 52,  LDL 157, triglycerides 187    Expected Outcome Goals in progress. [name] continues to attend the [facility] education/nutrition series regularly. He has medical history of CAD s/p NSTEMI, hyperlipidemia, HTN, PAF. Lipids remain elevated. He has maintained his weight since starting with our program. Patient will benefit from participation in intensive cardiac rehab for nutrition, exercise, and lifestyle modification.             Psychosocial: Target Goals:  Acknowledge presence or absence of significant depression and/or stress, maximize coping skills, provide positive support system. Participant is able to verbalize types and ability to use techniques and skills needed for reducing stress and depression.  Initial Review & Psychosocial Screening:  Initial Psych Review & Screening - 09/18/23 1015       Initial Review   Current issues with Current Depression;Current Sleep Concerns;Current Stress Concerns    Source of Stress Concerns Occupation    Comments Pt with h/o of depression. Has seen therapist/physciatrist in the past. Does not feel that he needs to see anyone at this time.      Family Dynamics   Good Support System? Yes   Has wife, Misty Stanley, for support     Barriers   Psychosocial barriers to participate in program The patient should benefit from training in stress management and relaxation.      Screening Interventions   Interventions Encouraged to exercise    Expected Outcomes Short Term goal: Utilizing psychosocial counselor, staff and physician to assist with identification of specific Stressors or current issues interfering with healing process. Setting desired goal for each stressor or current issue identified.;Long Term Goal: Stressors or current issues are controlled or eliminated.;Short Term goal: Identification and review with participant of any Quality of Life or Depression concerns found by scoring the questionnaire.;Long Term goal: The participant improves quality of Life and PHQ9 Scores as seen by post scores and/or verbalization of changes             Quality of Life Scores:  Quality of Life - 09/18/23 1018       Quality of Life   Select Quality of Life      Quality of Life Scores   Health/Function Pre 25.5 %    Socioeconomic Pre 26.21 %    Psych/Spiritual Pre 19.57 %    Family Pre 26.4 %    GLOBAL Pre 24.56 %            Scores of 19 and below usually indicate a poorer quality of life in these areas.  A  difference of  2-3 points is a clinically meaningful difference.  A difference of 2-3 points in the total score of the Quality of Life Index has been associated with significant improvement in overall quality of life, self-image, physical symptoms, and general health in studies assessing change in quality of life.  PHQ-9: Review Flowsheet       09/18/2023 01/13/2015  Depression screen PHQ 2/9  Decreased Interest 1 0  Down, Depressed, Hopeless 1 0  PHQ - 2 Score 2 0  Altered sleeping 2 -  Tired, decreased energy 0 -  Change in appetite 1 -  Feeling bad or failure about yourself  1 -  Trouble concentrating 0 -  Moving slowly or fidgety/restless 0 -  Suicidal thoughts 0 -  PHQ-9 Score 6 -  Difficult doing work/chores Somewhat difficult -   Interpretation of Total Score  Total Score Depression Severity:  1-4 = Minimal depression, 5-9 = Mild depression, 10-14 = Moderate  depression, 15-19 = Moderately severe depression, 20-27 = Severe depression   Psychosocial Evaluation and Intervention:   Psychosocial Re-Evaluation:  Psychosocial Re-Evaluation     Row Name 09/25/23 1403 10/16/23 1039           Psychosocial Re-Evaluation   Current issues with History of Depression;Current Sleep Concerns;Current Stress Concerns History of Depression;Current Sleep Concerns;Current Stress Concerns      Comments [name] started cardiac rehab on 09/24/23. [name] denied having any increased concerns or stressors on his first day of exercise. [name] continues not to voice  any increased concerns or stressors during exercise at cardiac rehab.      Expected Outcomes [name] will have controlled or decreased stressors upon completion of cardiac rehab [name] will have controlled or decreased stressors upon completion of cardiac rehab      Interventions Stress management education;Relaxation education;Encouraged to attend Cardiac Rehabilitation for the exercise Stress management education;Relaxation  education;Encouraged to attend Cardiac Rehabilitation for the exercise      Continue Psychosocial Services  Follow up required by staff Follow up required by staff        Initial Review   Source of Stress Concerns Occupation Occupation      Comments Will continue to monitor and offer support as needed Will continue to monitor and offer support as needed               Psychosocial Discharge (Final Psychosocial Re-Evaluation):  Psychosocial Re-Evaluation - 10/16/23 1039       Psychosocial Re-Evaluation   Current issues with History of Depression;Current Sleep Concerns;Current Stress Concerns    Comments Everardo All continues not to voice  any increased concerns or stressors during exercise at cardiac rehab.    Expected Outcomes [name] will have controlled or decreased stressors upon completion of cardiac rehab    Interventions Stress management education;Relaxation education;Encouraged to attend Cardiac Rehabilitation for the exercise    Continue Psychosocial Services  Follow up required by staff      Initial Review   Source of Stress Concerns Occupation    Comments Will continue to monitor and offer support as needed             Vocational Rehabilitation: Provide vocational rehab assistance to qualifying candidates.   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 Participant will state understanding/return demonstration of topics presented.    Education     Row Name 09/24/23 0900     Education   Cardiac Education Topics [facility]   Select Workshops     Workshops   Educator Exercise Physiologist   Select Exercise   Exercise Workshop Location manager and  Fall Prevention   Instruction Review Code 1- Verbalizes Understanding   Class Start Time [phone]   Class Stop Time 0848   Class Time Calculation (min) 34 min    Row Name 09/26/23 0900     Education   Cardiac Education Topics [facility]   Secondary school teacher School   Educator Respiratory Therapist;Nurse   Weekly Topic Adding Flavor - Sodium-Free   Instruction Review Code 1- Verbalizes Understanding   Class Start Time 0815   Class  Stop Time 0851   Class Time Calculation (min) 36 min    Row Name 09/28/23 0800     Education   Cardiac Education Topics [facility]   Select Core Videos     Core Videos   Educator Dietitian   Select Nutrition   Nutrition Overview of the [facility] Eating Plan   Instruction Review Code 1- Verbalizes Understanding   Class Start Time 0815   Class Stop Time 0900   Class Time Calculation (min) 45 min    Row Name 10/01/23 0900     Education   Cardiac Education Topics [facility]   Select Core Videos     Core Videos   Educator Exercise Physiologist   Select Psychosocial   Psychosocial Healthy Minds, Bodies, Hearts   Instruction Review Code 1- Verbalizes Understanding   Class Start Time 0813   Class Stop Time 0847   Class Time Calculation (min) 34 min    Row Name 10/03/23 0900     Education   Cardiac Education Topics [facility]   Secondary school teacher School   Educator Respiratory Therapist;Nurse   Weekly Topic Fast and Healthy Breakfasts   Instruction Review Code 1- Verbalizes Understanding   Class Start Time [phone]   Class Stop Time 0844   Class Time Calculation (min) 32 min    Row Name 10/05/23 0900     Education   Cardiac Education Topics [facility]   Select Core Videos     Core Videos   Educator Dietitian   Select Nutrition   Nutrition Other  Label Reading   Instruction Review Code 1- Verbalizes Understanding   Class Start Time 0815   Class Stop Time 0853   Class Time Calculation (min) 38 min    Row Name  10/08/23 0900     Education   Cardiac Education Topics [facility]   Select Core Videos     Core Videos   Educator Dietitian   Select Nutrition   Nutrition Becoming a [employer] Chef   Instruction Review Code 1- Verbalizes Understanding   Class Start Time 0815   Class Stop Time 0856   Class Time Calculation (min) 41 min    Row Name 10/10/23 0900     Education   Cardiac Education Topics [facility]   Secondary school teacher School   Educator Dietitian;Respiratory Therapist   Weekly Topic Personalizing Your [facility] Plate   Instruction Review Code 1- Verbalizes Understanding   Class Start Time 0815   Class Stop Time 0848   Class Time Calculation (min) 33 min    Row Name 10/12/23 1100     Education   Cardiac Education Topics [facility]   Glass blower/designer Nutrition   Nutrition Workshop Label Reading   Instruction Review Code 1- Verbalizes Understanding   Class Start Time 0815   Class Stop Time 0900   Class Time Calculation (min) 45 min    Row Name 10/15/23 1000     Education   Cardiac Education Topics [facility]   Select Workshops     Workshops   Educator Exercise Physiologist   Select Psychosocial   Psychosocial Workshop Recognizing and Reducing Stress   Instruction Review Code 1- Verbalizes Understanding   Class Start Time 0815   Class Stop Time 0858   Class Time Calculation (min) 43 min            Core Videos: Exercise  Move It!  Clinical staff conducted group or individual video education with verbal and written material and guidebook.  Patient learns the recommended Pritikin exercise program. Exercise with the goal of living a long, healthy life. Some of the health benefits of exercise include controlled diabetes, healthier blood pressure levels, improved cholesterol levels, improved heart and lung capacity, improved sleep, and better body composition. Everyone should speak with their doctor before  starting or changing an exercise routine.  Biomechanical Limitations Clinical staff conducted group or individual video education with verbal and written material and guidebook.  Patient learns how biomechanical limitations can impact exercise and how we can mitigate and possibly overcome limitations to have an impactful and balanced exercise routine.  Body Composition Clinical staff conducted group or individual video education with verbal and written material and guidebook.  Patient learns that body composition (ratio of muscle mass to fat mass) is a key component to assessing overall fitness, rather than body weight alone. Increased fat mass, especially visceral belly fat, can put [place] at increased risk for metabolic syndrome, type 2 diabetes, heart disease, and even death. It is recommended to combine diet and exercise (cardiovascular and resistance training) to improve your body composition. Seek guidance from your physician and exercise physiologist before implementing an exercise routine.  Exercise Action Plan Clinical staff conducted group or individual video education with verbal and written material and guidebook.  Patient learns the recommended strategies to achieve and enjoy long-term exercise adherence, including variety, self-motivation, self-efficacy, and positive decision making. Benefits of exercise include fitness, good health, weight management, more energy, better sleep, less stress, and overall well-being.  Medical   Heart Disease Risk Reduction Clinical staff conducted group or individual video education with verbal and written material and guidebook.  Patient learns our heart is our most vital organ as it circulates oxygen, nutrients, white blood cells, and hormones throughout the entire body, and carries waste away. Data supports a plant-based eating plan like the Pritikin Program for its effectiveness in slowing progression of and reversing heart disease. The video provides a  number of recommendations to address heart disease.   Metabolic Syndrome and Belly Fat  Clinical staff conducted group or individual video education with verbal and written material and guidebook.  Patient learns what metabolic syndrome is, how it leads to heart disease, and how one can reverse it and keep it from coming back. You have metabolic syndrome if you have 3 of the following 5 criteria: abdominal obesity, high blood pressure, high triglycerides, low HDL cholesterol, and high blood sugar.  Hypertension and Heart Disease Clinical staff conducted group or individual video education with verbal and written material and guidebook.  Patient learns that high blood pressure, or hypertension, is very common in the [place]. Hypertension is largely due to excessive salt intake, but other important risk factors include being overweight, physical inactivity, drinking too much alcohol, smoking, and not eating enough potassium from fruits and vegetables. High blood pressure is a leading risk factor for heart attack, stroke, congestive heart failure, dementia, kidney failure, and premature death. Long-term effects of excessive salt intake include stiffening of the arteries and thickening of heart muscle and organ damage. Recommendations include ways to reduce hypertension and the risk of heart disease.  Diseases of Our Time - Focusing on Diabetes Clinical staff conducted group or individual video education with verbal and written material and guidebook.  Patient learns why the best way to stop diseases of our time is prevention, through food and other  lifestyle changes. Medicine (such as prescription pills and surgeries) is often only a Band-Aid on the problem, not a long-term solution. Most common diseases of our time include obesity, type 2 diabetes, hypertension, heart disease, and cancer. The Pritikin Program is recommended and has been proven to help reduce, reverse, and/or prevent the damaging  effects of metabolic syndrome.  Nutrition   Overview of the Pritikin Eating Plan  Clinical staff conducted group or individual video education with verbal and written material and guidebook.  Patient learns about the Pritikin Eating Plan for disease risk reduction. The Pritikin Eating Plan emphasizes a wide variety of unrefined, minimally-processed carbohydrates, like fruits, vegetables, whole grains, and legumes. Go, Caution, and Stop food choices are explained. Plant-based and lean animal proteins are emphasized. Rationale provided for low sodium intake for blood pressure control, low added sugars for blood sugar stabilization, and low added fats and oils for coronary artery disease risk reduction and weight management.  Calorie Density  Clinical staff conducted group or individual video education with verbal and written material and guidebook.  Patient learns about calorie density and how it impacts the Pritikin Eating Plan. Knowing the characteristics of the food you choose will help you decide whether those foods will lead to weight gain or weight loss, and whether you want to consume more or less of them. Weight loss is usually a side effect of the Pritikin Eating Plan because of its focus on low calorie-dense foods.  Label Reading  Clinical staff conducted group or individual video education with verbal and written material and guidebook.  Patient learns about the Pritikin recommended label reading guidelines and corresponding recommendations regarding calorie density, added sugars, sodium content, and whole grains.  Dining Out - Part 1  Clinical staff conducted group or individual video education with verbal and written material and guidebook.  Patient learns that restaurant meals can be sabotaging because they can be so high in calories, fat, sodium, and/or sugar. Patient learns recommended strategies on how to positively address this and avoid unhealthy pitfalls.  Facts on Fats   Clinical staff conducted group or individual video education with verbal and written material and guidebook.  Patient learns that lifestyle modifications can be just as effective, if not more so, as many medications for lowering your risk of heart disease. A Pritikin lifestyle can help to reduce your risk of inflammation and atherosclerosis (cholesterol build-up, or plaque, in the artery walls). Lifestyle interventions such as dietary choices and physical activity address the cause of atherosclerosis. A review of the types of fats and their impact on blood cholesterol levels, along with dietary recommendations to reduce fat intake is also included.  Nutrition Action Plan  Clinical staff conducted group or individual video education with verbal and written material and guidebook.  Patient learns how to incorporate Pritikin recommendations into their lifestyle. Recommendations include planning and keeping personal health goals in mind as an important part of their success.  Healthy Mind-Set    Healthy Minds, Bodies, Hearts  Clinical staff conducted group or individual video education with verbal and written material and guidebook.  Patient learns how to identify when they are stressed. Video will discuss the impact of that stress, as well as the many benefits of stress management. Patient will also be introduced to stress management techniques. The way we think, act, and feel has an impact on our hearts.  How Our Thoughts Can Heal Our Hearts  Clinical staff conducted group or individual video education with verbal and written material  and guidebook.  Patient learns that negative thoughts can cause depression and anxiety. This can result in negative lifestyle behavior and serious health problems. Cognitive behavioral therapy is an effective method to help control our thoughts in order to change and improve our emotional outlook.  Additional Videos:  Exercise    Improving Performance  Clinical  staff conducted group or individual video education with verbal and written material and guidebook.  Patient learns to use a non-linear approach by alternating intensity levels and lengths of time spent exercising to help burn more calories and lose more body fat. Cardiovascular exercise helps improve heart health, metabolism, hormonal balance, blood sugar control, and recovery from fatigue. Resistance training improves strength, endurance, balance, coordination, reaction time, metabolism, and muscle mass. Flexibility exercise improves circulation, posture, and balance. Seek guidance from your physician and exercise physiologist before implementing an exercise routine and learn your capabilities and proper form for all exercise.  Introduction to Yoga  Clinical staff conducted group or individual video education with verbal and written material and guidebook.  Patient learns about yoga, a discipline of the coming together of mind, breath, and body. The benefits of yoga include improved flexibility, improved range of motion, better posture and core strength, increased lung function, weight loss, and positive self-image. Yoga's heart health benefits include lowered blood pressure, healthier heart rate, decreased cholesterol and triglyceride levels, improved immune function, and reduced stress. Seek guidance from your physician and exercise physiologist before implementing an exercise routine and learn your capabilities and proper form for all exercise.  Medical   Aging: Enhancing Your Quality of Life  Clinical staff conducted group or individual video education with verbal and written material and guidebook.  Patient learns key strategies and recommendations to stay in good physical health and enhance quality of life, such as prevention strategies, having an advocate, securing a Health Care Proxy and Power of Attorney, and keeping a list of medications and system for tracking them. It also discusses how to  avoid risk for bone loss.  Biology of Weight Control  Clinical staff conducted group or individual video education with verbal and written material and guidebook.  Patient learns that weight gain occurs because we consume more calories than we burn (eating more, moving less). Even if your body weight is normal, you may have higher ratios of fat compared to muscle mass. Too much body fat puts you at increased risk for cardiovascular disease, heart attack, stroke, type 2 diabetes, and obesity-related cancers. In addition to exercise, following the Pritikin Eating Plan can help reduce your risk.  Decoding Lab Results  Clinical staff conducted group or individual video education with verbal and written material and guidebook.  Patient learns that lab test reflects one measurement whose values change over time and are influenced by many factors, including medication, stress, sleep, exercise, food, hydration, pre-existing medical conditions, and more. It is recommended to use the knowledge from this video to become more involved with your lab results and evaluate your numbers to speak with your doctor.   Diseases of Our Time - Overview  Clinical staff conducted group or individual video education with verbal and written material and guidebook.  Patient learns that according to the CDC, 50% to 70% of chronic diseases (such as obesity, type 2 diabetes, elevated lipids, hypertension, and heart disease) are avoidable through lifestyle improvements including healthier food choices, listening to satiety cues, and increased physical activity.  Sleep Disorders Clinical staff conducted group or individual video education with verbal and written  material and guidebook.  Patient learns how good quality and duration of sleep are important to overall health and well-being. Patient also learns about sleep disorders and how they impact health along with recommendations to address them, including discussing with a  physician.  Nutrition  Dining Out - Part 2 Clinical staff conducted group or individual video education with verbal and written material and guidebook.  Patient learns how to plan ahead and communicate in order to maximize their dining experience in a healthy and nutritious manner. Included are recommended food choices based on the type of restaurant the patient is visiting.   Fueling a Banker conducted group or individual video education with verbal and written material and guidebook.  There is a strong connection between our food choices and our health. Diseases like obesity and type 2 diabetes are very prevalent and are in large-part due to lifestyle choices. The Pritikin Eating Plan provides plenty of food and hunger-curbing satisfaction. It is easy to follow, affordable, and helps reduce health risks.  Menu Workshop  Clinical staff conducted group or individual video education with verbal and written material and guidebook.  Patient learns that restaurant meals can sabotage health goals because they are often packed with calories, fat, sodium, and sugar. Recommendations include strategies to plan ahead and to communicate with the manager, chef, or server to help order a healthier meal.  Planning Your Eating Strategy  Clinical staff conducted group or individual video education with verbal and written material and guidebook.  Patient learns about the Pritikin Eating Plan and its benefit of reducing the risk of disease. The Pritikin Eating Plan does not focus on calories. Instead, it emphasizes high-quality, nutrient-rich foods. By knowing the characteristics of the foods, we choose, we can determine their calorie density and make informed decisions.  Targeting Your Nutrition Priorities  Clinical staff conducted group or individual video education with verbal and written material and guidebook.  Patient learns that lifestyle habits have a tremendous impact on disease  risk and progression. This video provides eating and physical activity recommendations based on your personal health goals, such as reducing LDL cholesterol, losing weight, preventing or controlling type 2 diabetes, and reducing high blood pressure.  Vitamins and Minerals  Clinical staff conducted group or individual video education with verbal and written material and guidebook.  Patient learns different ways to obtain key vitamins and minerals, including through a recommended healthy diet. It is important to discuss all supplements you take with your doctor.   Healthy Mind-Set    Smoking Cessation  Clinical staff conducted group or individual video education with verbal and written material and guidebook.  Patient learns that cigarette smoking and tobacco addiction pose a serious health risk which affects millions of people. Stopping smoking will significantly reduce the risk of heart disease, lung disease, and many forms of cancer. Recommended strategies for quitting are covered, including working with your doctor to develop a successful plan.  Culinary   Becoming a Set designer conducted group or individual video education with verbal and written material and guidebook.  Patient learns that cooking at home can be healthy, cost-effective, quick, and puts them in control. Keys to cooking healthy recipes will include looking at your recipe, assessing your equipment needs, planning ahead, making it simple, choosing cost-effective seasonal ingredients, and limiting the use of added fats, salts, and sugars.  Cooking - Breakfast and Snacks  Clinical staff conducted group or individual video education with verbal and written  material and guidebook.  Patient learns how important breakfast is to satiety and nutrition through the entire day. Recommendations include key foods to eat during breakfast to help stabilize blood sugar levels and to prevent overeating at meals later in the day.  Planning ahead is also a key component.  Cooking - Educational psychologist conducted group or individual video education with verbal and written material and guidebook.  Patient learns eating strategies to improve overall health, including an approach to cook more at home. Recommendations include thinking of animal protein as a side on your plate rather than center stage and focusing instead on lower calorie dense options like vegetables, fruits, whole grains, and plant-based proteins, such as beans. Making sauces in large quantities to freeze for later and leaving the skin on your vegetables are also recommended to maximize your experience.  Cooking - Healthy Salads and Dressing Clinical staff conducted group or individual video education with verbal and written material and guidebook.  Patient learns that vegetables, fruits, whole grains, and legumes are the foundations of the Pritikin Eating Plan. Recommendations include how to incorporate each of these in flavorful and healthy salads, and how to create homemade salad dressings. Proper handling of ingredients is also covered. Cooking - Soups and [employer] - Soups and Desserts Clinical staff conducted group or individual video education with verbal and written material and guidebook.  Patient learns that Pritikin soups and desserts make for easy, nutritious, and delicious snacks and meal components that are low in sodium, fat, sugar, and calorie density, while high in vitamins, minerals, and filling fiber. Recommendations include simple and healthy ideas for soups and desserts.   Overview     The Pritikin Solution Program Overview Clinical staff conducted group or individual video education with verbal and written material and guidebook.  Patient learns that the results of the Pritikin Program have been documented in more than 100 articles published in peer-reviewed journals, and the benefits include reducing risk factors for  (and, in some cases, even reversing) high cholesterol, high blood pressure, type 2 diabetes, obesity, and more! An overview of the three key pillars of the Pritikin Program will be covered: eating well, doing regular exercise, and having a healthy mind-set.  WORKSHOPS  Exercise: Exercise Basics: Building Your Action Plan Clinical staff led group instruction and group discussion with PowerPoint presentation and patient guidebook. To enhance the learning environment the use of posters, models and videos may be added. At the conclusion of this workshop, patients will comprehend the difference between physical activity and exercise, as well as the benefits of incorporating both, into their routine. Patients will understand the FITT (Frequency, Intensity, Time, and Type) principle and how to use it to build an exercise action plan. In addition, safety concerns and other considerations for exercise and cardiac rehab will be addressed by the presenter. The purpose of this lesson is to promote a comprehensive and effective weekly exercise routine in order to improve patients' overall level of fitness.   Managing Heart Disease: Your Path to a Healthier Heart Clinical staff led group instruction and group discussion with PowerPoint presentation and patient guidebook. To enhance the learning environment the use of posters, models and videos may be added.At the conclusion of this workshop, patients will understand the anatomy and physiology of the heart. Additionally, they will understand how Pritikin's three pillars impact the risk factors, the progression, and the management of heart disease.  The purpose of this lesson is to provide a  high-level overview of the heart, heart disease, and how the Pritikin lifestyle positively impacts risk factors.  Exercise Biomechanics Clinical staff led group instruction and group discussion with PowerPoint presentation and patient guidebook. To enhance the learning  environment the use of posters, models and videos may be added. Patients will learn how the structural parts of their bodies function and how these functions impact their daily activities, movement, and exercise. Patients will learn how to promote a neutral spine, learn how to manage pain, and identify ways to improve their physical movement in order to promote healthy living. The purpose of this lesson is to expose patients to common physical limitations that impact physical activity. Participants will learn practical ways to adapt and manage aches and pains, and to minimize their effect on regular exercise. Patients will learn how to maintain good posture while sitting, walking, and lifting.  Balance Training and Fall Prevention  Clinical staff led group instruction and group discussion with PowerPoint presentation and patient guidebook. To enhance the learning environment the use of posters, models and videos may be added. At the conclusion of this workshop, patients will understand the importance of their sensorimotor skills (vision, proprioception, and the vestibular system) in maintaining their ability to balance as they age. Patients will apply a variety of balancing exercises that are appropriate for their current level of function. Patients will understand the common causes for poor balance, possible solutions to these problems, and ways to modify their physical environment in order to minimize their fall risk. The purpose of this lesson is to teach patients about the importance of maintaining balance as they age and ways to minimize their risk of falling.  WORKSHOPS   Nutrition:  Fueling a Ship broker led group instruction and group discussion with PowerPoint presentation and patient guidebook. To enhance the learning environment the use of posters, models and videos may be added. Patients will review the foundational principles of the Pritikin Eating Plan and understand  what constitutes a serving size in each of the food groups. Patients will also learn Pritikin-friendly foods that are better choices when away from home and review make-ahead meal and snack options. Calorie density will be reviewed and applied to three nutrition priorities: weight maintenance, weight loss, and weight gain. The purpose of this lesson is to reinforce (in a group setting) the key concepts around what patients are recommended to eat and how to apply these guidelines when away from home by planning and selecting Pritikin-friendly options. Patients will understand how calorie density may be adjusted for different weight management goals.  Mindful Eating  Clinical staff led group instruction and group discussion with PowerPoint presentation and patient guidebook. To enhance the learning environment the use of posters, models and videos may be added. Patients will briefly review the concepts of the Pritikin Eating Plan and the importance of low-calorie dense foods. The concept of mindful eating will be introduced as well as the importance of paying attention to internal hunger signals. Triggers for non-hunger eating and techniques for dealing with triggers will be explored. The purpose of this lesson is to provide patients with the opportunity to review the basic principles of the Pritikin Eating Plan, discuss the value of eating mindfully and how to measure internal cues of hunger and fullness using the Hunger Scale. Patients will also discuss reasons for non-hunger eating and learn strategies to use for controlling emotional eating.  Targeting Your Nutrition Priorities Clinical staff led group instruction and group discussion with PowerPoint presentation  and patient guidebook. To enhance the learning environment the use of posters, models and videos may be added. Patients will learn how to determine their genetic susceptibility to disease by reviewing their family history. Patients will gain  insight into the importance of diet as part of an overall healthy lifestyle in mitigating the impact of genetics and other environmental insults. The purpose of this lesson is to provide patients with the opportunity to assess their personal nutrition priorities by looking at their family history, their own health history and current risk factors. Patients will also be able to discuss ways of prioritizing and modifying the [facility] Eating Plan for their highest risk areas  Menu  Clinical staff led group instruction and group discussion with PowerPoint presentation and patient guidebook. To enhance the learning environment the use of posters, models and videos may be added. Using menus brought in from [employer], or printed from [employer], patients will apply the [facility] dining out guidelines that were presented in the [employer] video. Patients will also be able to practice these guidelines in a variety of provided scenarios. The purpose of this lesson is to provide patients with the opportunity to practice hands-on learning of the [facility] Dining Out guidelines with actual menus and practice scenarios.  Label Reading Clinical staff led group instruction and group discussion with PowerPoint presentation and patient guidebook. To enhance the learning environment the use of posters, models and videos may be added. Patients will review and discuss the [facility] label reading guidelines presented in [employer]'s Label Reading Educational series video. Using fool labels brought in from local grocery stores and markets, patients will apply the label reading guidelines and determine if the packaged food meet the [facility] guidelines. The purpose of this lesson is to provide patients with the opportunity to review, discuss, and practice hands-on learning of the [facility] Label Reading guidelines with actual packaged food labels. Cooking School  [facility]'s [employer] are  designed to teach patients ways to prepare quick, simple, and affordable recipes at home. The importance of nutrition's role in chronic disease risk reduction is reflected in its emphasis in the overall [facility] program. By learning how to prepare essential core [facility] Eating Plan recipes, patients will increase control over what they eat; be able to customize the flavor of foods without the use of added salt, sugar, or fat; and improve the quality of the food they consume. By learning a set of core recipes which are easily assembled, quickly prepared, and affordable, patients are more likely to prepare more healthy foods at home. These workshops focus on convenient breakfasts, simple entres, side dishes, and desserts which can be prepared with minimal effort and are consistent with nutrition recommendations for cardiovascular risk reduction. Cooking [employer] are taught by a Armed forces logistics/support/administrative officer (RD) who has been trained by the [employer]. The chef or RD has a clear understanding of the importance of minimizing - if not completely eliminating - added fat, sugar, and sodium in recipes. Throughout the series of Cooking School Workshop sessions, patients will learn about healthy ingredients and efficient methods of cooking to build confidence in their capability to prepare    Cooking School weekly topics:  Adding Flavor- Sodium-Free  Fast and Healthy Breakfasts  Powerhouse Plant-Based Proteins  Satisfying Salads and Dressings  Simple Sides and Sauces  International Cuisine-Spotlight on the [employer] Zones  Delicious Desserts  Savory Soups  [employer] - Meals in a Snap  Tasty Appetizers and  Snacks  Comforting Weekend Breakfasts  One-Pot Wonders   Fast [employer] Your [facility]rit[facility]in Plate  WORKSHOPS   Healthy Mindset (Psychosocial):  Focused Goals, Sustainable Changes Clinical staff led group instruction and group discussion  with PowerPoint presentation and patient guidebook. To enhance the learning environment the use of posters, models and videos may be added. Patients will be able to apply effective goal setting strategies to establish at least one personal goal, and then take consistent, meaningful action toward that goal. They will learn to identify common barriers to achieving personal goals and develop strategies to overcome them. Patients will also gain an understanding of how our mind-set can impact our ability to achieve goals and the importance of cultivating a positive and growth-oriented mind-set. The purpose of this lesson is to provide patients with a deeper understanding of how to set and achieve personal goals, as well as the tools and strategies needed to overcome common obstacles which may arise along the way.  From Head to Heart: The Power of a Healthy Outlook  Clinical staff led group instruction and group discussion with PowerPoint presentation and patient guidebook. To enhance the learning environment the use of posters, models and videos may be added. Patients will be able to recognize and describe the impact of emotions and mood on physical health. They will discover the importance of self-care and explore self-care practices which may work for them. Patients will also learn how to utilize the 4 C's to cultivate a healthier outlook and better manage stress and challenges. The purpose of this lesson is to demonstrate to patients how a healthy outlook is an essential part of maintaining good health, especially as they continue their cardiac rehab journey.  Healthy Sleep for a Healthy Heart Clinical staff led group instruction and group discussion with PowerPoint presentation and patient guidebook. To enhance the learning environment the use of posters, models and videos may be added. At the conclusion of this workshop, patients will be able to demonstrate knowledge of the importance of sleep to overall  health, well-being, and quality of life. They will understand the symptoms of, and treatments for, common sleep disorders. Patients will also be able to identify daytime and nighttime behaviors which impact sleep, and they will be able to apply these tools to help manage sleep-related challenges. The purpose of this lesson is to provide patients with a general overview of sleep and outline the importance of quality sleep. Patients will learn about a few of the most common sleep disorders. Patients will also be introduced to the concept of "sleep hygiene," and discover ways to self-manage certain sleeping problems through simple daily behavior changes. Finally, the workshop will motivate patients by clarifying the links between quality sleep and their goals of heart-healthy living.   Recognizing and Reducing Stress Clinical staff led group instruction and group discussion with PowerPoint presentation and patient guidebook. To enhance the learning environment the use of posters, models and videos may be added. At the conclusion of this workshop, patients will be able to understand the types of stress reactions, differentiate between acute and chronic stress, and recognize the impact that chronic stress has on their health. They will also be able to apply different coping mechanisms, such as reframing negative self-talk. Patients will have the opportunity to practice a variety of stress management techniques, such as deep abdominal breathing, progressive muscle relaxation, and/or guided imagery.  The purpose of this lesson is to educate patients on the role of stress  in their lives and to provide healthy techniques for coping with it.  Learning Barriers/Preferences:  Learning Barriers/Preferences - 09/18/23 1018       Learning Barriers/Preferences   Learning Barriers Sight;Hearing   wears glasses and bilateral hearing aids   Learning Preferences Skilled Demonstration;Individual Instruction;Group Instruction              Education Topics:  Knowledge Questionnaire Score:  Knowledge Questionnaire Score - 09/18/23 1019       Knowledge Questionnaire Score   Pre Score 23/24             Core Components/Risk Factors/Patient Goals at Admission:  Personal Goals and Risk Factors at Admission - 09/18/23 1022       Core Components/Risk Factors/Patient Goals on Admission    Weight Management Yes;Weight Loss    Intervention Weight Management: Develop a combined nutrition and exercise program designed to reach desired caloric intake, while maintaining appropriate intake of nutrient and fiber, sodium and fats, and appropriate energy expenditure required for the weight goal.;Weight Management: Provide education and appropriate resources to help participant work on and attain dietary goals.;Weight Management/Obesity: Establish reasonable short term and long term weight goals.    Expected Outcomes Short Term: Continue to assess and modify interventions until short term weight is achieved;Long Term: Adherence to nutrition and physical activity/exercise program aimed toward attainment of established weight goal;Weight Loss: Understanding of general recommendations for a balanced deficit meal plan, which promotes 1-2 lb weight loss per week and includes a negative energy balance of [phone] kcal/d;Understanding recommendations for meals to include 15-35% energy as protein, 25-35% energy from fat, 35-60% energy from carbohydrates, less than 200mg  of dietary cholesterol, 20-35 gm of total fiber daily;Understanding of distribution of calorie intake throughout the day with the consumption of 4-5 meals/snacks    Hypertension Yes    Intervention Provide education on lifestyle modifcations including regular physical activity/exercise, weight management, moderate sodium restriction and increased consumption of fresh fruit, vegetables, and low fat dairy, alcohol moderation, and smoking cessation.;Monitor prescription use  compliance.    Expected Outcomes Short Term: Continued assessment and intervention until BP is < 140/35mm HG in hypertensive participants. < 130/23mm HG in hypertensive participants with diabetes, heart failure or chronic kidney disease.;Long Term: Maintenance of blood pressure at goal levels.    Lipids Yes    Intervention Provide education and support for participant on nutrition & aerobic/resistive exercise along with prescribed medications to achieve LDL 70mg , HDL >40mg .    Expected Outcomes Short Term: Participant states understanding of desired cholesterol values and is compliant with medications prescribed. Participant is following exercise prescription and nutrition guidelines.;Long Term: Cholesterol controlled with medications as prescribed, with individualized exercise RX and with personalized nutrition plan. Value goals: LDL < 70mg , HDL > 40 mg.    Stress Yes    Intervention Offer individual and/or small group education and counseling on adjustment to heart disease, stress management and health-related lifestyle change. Teach and support self-help strategies.    Expected Outcomes Short Term: Participant demonstrates changes in health-related behavior, relaxation and other stress management skills, ability to obtain effective social support, and compliance with psychotropic medications if prescribed.;Long Term: Emotional wellbeing is indicated by absence of clinically significant psychosocial distress or social isolation.             Core Components/Risk Factors/Patient Goals Review:   Goals and Risk Factor Review     Row Name 09/25/23 1410 10/16/23 1041           Core  Components/Risk Factors/Patient Goals Review   Personal Goals Review Weight Management/Obesity;Lipids;Hypertension;Stress Weight Management/Obesity;Lipids;Hypertension;Stress      Review [name] started cardiac rehab on 09/24/23. [name] did well with exercise. Vital signs were stable. [name] started cardiac rehab on  09/24/23. [name] continues to do  well with exercise. Vital signs have been stable. [name] has increased his met levels.      Expected Outcomes [name] will continue to participate in cardiac rehab for exercise, nutrition and lifestyle modifications Everardo All will continue to participate in cardiac rehab for exercise, nutrition and lifestyle modifications               Core Components/Risk Factors/Patient Goals at Discharge (Final Review):   Goals and Risk Factor Review - 10/16/23 1041       Core Components/Risk Factors/Patient Goals Review   Personal Goals Review Weight Management/Obesity;Lipids;Hypertension;Stress    Review [name] started cardiac rehab on 09/24/23. [name] continues to do  well with exercise. Vital signs have been stable. [name] has increased his met levels.    Expected Outcomes [name] will continue to participate in cardiac rehab for exercise, nutrition and lifestyle modifications             ITP Comments:  ITP Comments     Row Name 09/18/23 0929 09/25/23 1357 10/16/23 1026       ITP Comments Armanda Magic, MD: Medical Director.  Introduction to the [facility] Education Program/Intensive Cardiac Rehab.  Initial orientation packet reviewed with the patient today. 30 Day ITP Review. [name] started cardiac rehab on 09/24/23. [name] did well with exercise. 30 Day ITP Review. [name] has good attendance and participation with exercise at cardiac rehab              Comments: See ITP comments.Thayer Headings RN BSN

## 2023-10-17 ENCOUNTER — Encounter (HOSPITAL_COMMUNITY)
Admission: RE | Admit: 2023-10-17 | Discharge: 2023-10-17 | Disposition: A | Discharge status: Home / Self Care | Payer: PPO | Source: Ambulatory Visit | Attending: Cardiology | Admitting: Cardiology

## 2023-10-17 DIAGNOSIS — Z48812 Encounter for surgical aftercare following surgery on the circulatory system: Secondary | ICD-10-CM | POA: Diagnosis not present

## 2023-10-17 DIAGNOSIS — Z955 Presence of coronary angioplasty implant and graft: Secondary | ICD-10-CM

## 2023-10-17 DIAGNOSIS — I214 Non-ST elevation (NSTEMI) myocardial infarction: Secondary | ICD-10-CM

## 2023-10-17 NOTE — Progress Notes (Signed)
 Reviewed home exercise Rx with patient today.  Encouraged warm-up, cool-down, and stretching. Reviewed THRR of  60-120 and keeping RPE between 11-13. Encouraged to hydrate with activity.  Reviewed weather parameters for temperature and humidity for safe exercise outdoors. Reviewed S/S to terminate exercise and when to call 911 vs MD. Reviewed the use of NTG and pt was encouraged to carry at all times. Pt encouraged to always carry a cell phone for safety when exercising outdoors. Pt verbalized understanding of the home exercise Rx and was provided a copy.   Lorin Picket MS, ACSM-CEP, CCRP

## 2023-10-18 LAB — CUP PACEART REMOTE DEVICE CHECK
Battery Remaining Longevity: 74 mo
Battery Voltage: 2.97 V
Brady Statistic AP VP Percent: 0.12 %
Brady Statistic AP VS Percent: 99.62 %
Brady Statistic AS VP Percent: 0 %
Brady Statistic AS VS Percent: 0.26 %
Brady Statistic RA Percent Paced: 99.72 %
Brady Statistic RV Percent Paced: 0.12 %
Date Time Interrogation Session: 20250303211112
Implantable Lead Connection Status: 753985
Implantable Lead Connection Status: 753985
Implantable Lead Implant Date: 20180517
Implantable Lead Implant Date: 20180517
Implantable Lead Location: 753859
Implantable Lead Location: 753860
Implantable Lead Model: 5076
Implantable Lead Model: 5076
Implantable Pulse Generator Implant Date: 20180517
Lead Channel Impedance Value: 304 Ohm
Lead Channel Impedance Value: 323 Ohm
Lead Channel Impedance Value: 380 Ohm
Lead Channel Impedance Value: 437 Ohm
Lead Channel Pacing Threshold Amplitude: 0.625 V
Lead Channel Pacing Threshold Amplitude: 1 V
Lead Channel Pacing Threshold Pulse Width: 0.4 ms
Lead Channel Pacing Threshold Pulse Width: 0.4 ms
Lead Channel Sensing Intrinsic Amplitude: 1.875 mV
Lead Channel Sensing Intrinsic Amplitude: 1.875 mV
Lead Channel Sensing Intrinsic Amplitude: 19.625 mV
Lead Channel Sensing Intrinsic Amplitude: 19.625 mV
Lead Channel Setting Pacing Amplitude: 2 V
Lead Channel Setting Pacing Amplitude: 2.5 V
Lead Channel Setting Pacing Pulse Width: 0.4 ms
Lead Channel Setting Sensing Sensitivity: 2 mV
Zone Setting Status: 755011
Zone Setting Status: 755011

## 2023-10-19 ENCOUNTER — Encounter (HOSPITAL_COMMUNITY): Payer: PPO

## 2023-10-22 ENCOUNTER — Encounter (HOSPITAL_COMMUNITY)
Admission: RE | Admit: 2023-10-22 | Discharge: 2023-10-22 | Disposition: A | Discharge status: Home / Self Care | Payer: PPO | Source: Ambulatory Visit | Attending: Cardiology

## 2023-10-22 DIAGNOSIS — Z955 Presence of coronary angioplasty implant and graft: Secondary | ICD-10-CM

## 2023-10-22 DIAGNOSIS — I214 Non-ST elevation (NSTEMI) myocardial infarction: Secondary | ICD-10-CM

## 2023-10-22 DIAGNOSIS — Z48812 Encounter for surgical aftercare following surgery on the circulatory system: Secondary | ICD-10-CM | POA: Diagnosis not present

## 2023-10-23 DIAGNOSIS — H04123 Dry eye syndrome of bilateral lacrimal glands: Secondary | ICD-10-CM | POA: Diagnosis not present

## 2023-10-23 DIAGNOSIS — H52203 Unspecified astigmatism, bilateral: Secondary | ICD-10-CM | POA: Diagnosis not present

## 2023-10-23 DIAGNOSIS — H26493 Other secondary cataract, bilateral: Secondary | ICD-10-CM | POA: Diagnosis not present

## 2023-10-23 DIAGNOSIS — H40023 Open angle with borderline findings, high risk, bilateral: Secondary | ICD-10-CM | POA: Diagnosis not present

## 2023-10-23 DIAGNOSIS — H43813 Vitreous degeneration, bilateral: Secondary | ICD-10-CM | POA: Diagnosis not present

## 2023-10-24 ENCOUNTER — Encounter (HOSPITAL_COMMUNITY)
Admission: RE | Admit: 2023-10-24 | Discharge: 2023-10-24 | Disposition: A | Discharge status: Home / Self Care | Payer: PPO | Source: Ambulatory Visit | Attending: Cardiology | Admitting: Cardiology

## 2023-10-24 DIAGNOSIS — Z48812 Encounter for surgical aftercare following surgery on the circulatory system: Secondary | ICD-10-CM | POA: Diagnosis not present

## 2023-10-24 DIAGNOSIS — I214 Non-ST elevation (NSTEMI) myocardial infarction: Secondary | ICD-10-CM

## 2023-10-24 DIAGNOSIS — Z955 Presence of coronary angioplasty implant and graft: Secondary | ICD-10-CM

## 2023-10-25 DIAGNOSIS — I455 Other specified heart block: Secondary | ICD-10-CM | POA: Diagnosis not present

## 2023-10-25 DIAGNOSIS — I4729 Other ventricular tachycardia: Secondary | ICD-10-CM | POA: Diagnosis not present

## 2023-10-25 DIAGNOSIS — I10 Essential (primary) hypertension: Secondary | ICD-10-CM | POA: Diagnosis not present

## 2023-10-25 DIAGNOSIS — I214 Non-ST elevation (NSTEMI) myocardial infarction: Secondary | ICD-10-CM | POA: Diagnosis not present

## 2023-10-26 ENCOUNTER — Encounter (HOSPITAL_COMMUNITY)
Admission: RE | Admit: 2023-10-26 | Discharge: 2023-10-26 | Disposition: A | Discharge status: Home / Self Care | Payer: PPO | Source: Ambulatory Visit | Attending: Cardiology | Admitting: Cardiology

## 2023-10-26 DIAGNOSIS — Z48812 Encounter for surgical aftercare following surgery on the circulatory system: Secondary | ICD-10-CM | POA: Diagnosis not present

## 2023-10-26 DIAGNOSIS — I214 Non-ST elevation (NSTEMI) myocardial infarction: Secondary | ICD-10-CM

## 2023-10-26 DIAGNOSIS — Z955 Presence of coronary angioplasty implant and graft: Secondary | ICD-10-CM

## 2023-10-26 LAB — LIPID PANEL
Chol/HDL Ratio: 3 ratio (ref 0.0–5.0)
Cholesterol, Total: 124 mg/dL (ref 100–199)
HDL: 41 mg/dL (ref >39)
LDL Chol Calc (NIH): 67 mg/dL (ref 0–99)
Triglycerides: 83 mg/dL (ref 0–149)
VLDL Cholesterol Cal: 16 mg/dL (ref 5–40)

## 2023-10-26 LAB — COMPREHENSIVE METABOLIC PANEL
ALT: 58 IU/L — ABNORMAL HIGH (ref 0–44)
AST: 43 IU/L — ABNORMAL HIGH (ref 0–40)
Albumin: 4.4 g/dL (ref 3.9–4.9)
Alkaline Phosphatase: 65 IU/L (ref 44–121)
BUN/Creatinine Ratio: 13 (ref 10–24)
BUN: 15 mg/dL (ref 8–27)
Bilirubin Total: 0.8 mg/dL (ref 0.0–1.2)
CO2: 23 mmol/L (ref 20–29)
Calcium: 10.2 mg/dL (ref 8.6–10.2)
Chloride: 107 mmol/L — ABNORMAL HIGH (ref 96–106)
Creatinine, Ser: 1.2 mg/dL (ref 0.76–1.27)
Globulin, Total: 2 g/dL (ref 1.5–4.5)
Glucose: 113 mg/dL — ABNORMAL HIGH (ref 70–99)
Potassium: 4.3 mmol/L (ref 3.5–5.2)
Sodium: 144 mmol/L (ref 134–144)
Total Protein: 6.4 g/dL (ref 6.0–8.5)
eGFR: 65 mL/min/{1.73_m2} (ref >59)

## 2023-10-29 ENCOUNTER — Encounter (HOSPITAL_COMMUNITY)
Admission: RE | Admit: 2023-10-29 | Discharge: 2023-10-29 | Disposition: A | Discharge status: Home / Self Care | Payer: PPO | Source: Ambulatory Visit | Attending: Cardiology

## 2023-10-29 DIAGNOSIS — I214 Non-ST elevation (NSTEMI) myocardial infarction: Secondary | ICD-10-CM

## 2023-10-29 DIAGNOSIS — Z48812 Encounter for surgical aftercare following surgery on the circulatory system: Secondary | ICD-10-CM | POA: Diagnosis not present

## 2023-10-29 DIAGNOSIS — Z955 Presence of coronary angioplasty implant and graft: Secondary | ICD-10-CM

## 2023-10-31 ENCOUNTER — Encounter (HOSPITAL_COMMUNITY)
Admission: RE | Admit: 2023-10-31 | Discharge: 2023-10-31 | Disposition: A | Discharge status: Home / Self Care | Payer: PPO | Source: Ambulatory Visit | Attending: Cardiology

## 2023-10-31 DIAGNOSIS — I214 Non-ST elevation (NSTEMI) myocardial infarction: Secondary | ICD-10-CM

## 2023-10-31 DIAGNOSIS — Z955 Presence of coronary angioplasty implant and graft: Secondary | ICD-10-CM

## 2023-10-31 DIAGNOSIS — Z48812 Encounter for surgical aftercare following surgery on the circulatory system: Secondary | ICD-10-CM | POA: Diagnosis not present

## 2023-11-02 ENCOUNTER — Encounter (HOSPITAL_COMMUNITY)
Admission: RE | Admit: 2023-11-02 | Discharge: 2023-11-02 | Disposition: A | Discharge status: Home / Self Care | Payer: PPO | Source: Ambulatory Visit | Attending: Cardiology | Admitting: Cardiology

## 2023-11-02 DIAGNOSIS — I214 Non-ST elevation (NSTEMI) myocardial infarction: Secondary | ICD-10-CM

## 2023-11-02 DIAGNOSIS — Z955 Presence of coronary angioplasty implant and graft: Secondary | ICD-10-CM

## 2023-11-02 DIAGNOSIS — Z48812 Encounter for surgical aftercare following surgery on the circulatory system: Secondary | ICD-10-CM | POA: Diagnosis not present

## 2023-11-05 ENCOUNTER — Encounter (HOSPITAL_COMMUNITY)
Admission: RE | Admit: 2023-11-05 | Discharge: 2023-11-05 | Disposition: A | Discharge status: Home / Self Care | Payer: PPO | Source: Ambulatory Visit | Attending: Cardiology | Admitting: Cardiology

## 2023-11-05 DIAGNOSIS — Z48812 Encounter for surgical aftercare following surgery on the circulatory system: Secondary | ICD-10-CM | POA: Diagnosis not present

## 2023-11-05 DIAGNOSIS — I214 Non-ST elevation (NSTEMI) myocardial infarction: Secondary | ICD-10-CM

## 2023-11-05 DIAGNOSIS — Z955 Presence of coronary angioplasty implant and graft: Secondary | ICD-10-CM

## 2023-11-07 ENCOUNTER — Encounter (HOSPITAL_COMMUNITY)
Admission: RE | Admit: 2023-11-07 | Discharge: 2023-11-07 | Disposition: A | Discharge status: Home / Self Care | Payer: PPO | Source: Ambulatory Visit | Attending: Cardiology | Admitting: Cardiology

## 2023-11-07 DIAGNOSIS — Z48812 Encounter for surgical aftercare following surgery on the circulatory system: Secondary | ICD-10-CM | POA: Diagnosis not present

## 2023-11-07 DIAGNOSIS — I214 Non-ST elevation (NSTEMI) myocardial infarction: Secondary | ICD-10-CM

## 2023-11-07 DIAGNOSIS — Z955 Presence of coronary angioplasty implant and graft: Secondary | ICD-10-CM

## 2023-11-09 ENCOUNTER — Encounter (HOSPITAL_COMMUNITY)
Admission: RE | Admit: 2023-11-09 | Discharge: 2023-11-09 | Disposition: A | Discharge status: Home / Self Care | Payer: PPO | Source: Ambulatory Visit | Attending: Cardiology | Admitting: Cardiology

## 2023-11-09 DIAGNOSIS — I214 Non-ST elevation (NSTEMI) myocardial infarction: Secondary | ICD-10-CM

## 2023-11-09 DIAGNOSIS — Z955 Presence of coronary angioplasty implant and graft: Secondary | ICD-10-CM

## 2023-11-09 DIAGNOSIS — Z48812 Encounter for surgical aftercare following surgery on the circulatory system: Secondary | ICD-10-CM | POA: Diagnosis not present

## 2023-11-12 ENCOUNTER — Encounter (HOSPITAL_COMMUNITY)
Admission: RE | Admit: 2023-11-12 | Discharge: 2023-11-12 | Disposition: A | Discharge status: Home / Self Care | Payer: PPO | Source: Ambulatory Visit | Attending: Cardiology | Admitting: Cardiology

## 2023-11-12 DIAGNOSIS — Z48812 Encounter for surgical aftercare following surgery on the circulatory system: Secondary | ICD-10-CM | POA: Diagnosis not present

## 2023-11-12 DIAGNOSIS — I214 Non-ST elevation (NSTEMI) myocardial infarction: Secondary | ICD-10-CM

## 2023-11-12 DIAGNOSIS — Z955 Presence of coronary angioplasty implant and graft: Secondary | ICD-10-CM

## 2023-11-12 NOTE — Progress Notes (Signed)
 Cardiac Individual Treatment Plan  Patient Details  Name: Charles Ellison. MRN: [id number] Date of Birth: [birth year] Referring Provider:   Flowsheet Row INTENSIVE CARDIAC REHAB ORIENT from 09/18/2023 in [facility]  Referring Provider Yates Decamp, MD       Initial Encounter Date:  Flowsheet Row INTENSIVE CARDIAC REHAB ORIENT from 09/18/2023 in [facility]  Date 09/18/23       Visit Diagnosis: 08/24/23 NSTEMI (non-ST elevated myocardial infarction) (HCC)  08/24/23 Status post coronary artery stent placement  Patient's Home Medications on Admission:  Current Outpatient Medications:    aspirin EC 81 MG tablet, Take 1 tablet (81 mg total) by mouth daily. Swallow whole., Disp: , Rfl:    atorvastatin (LIPITOR) 80 MG tablet, Take 1 tablet (80 mg total) by mouth daily., Disp: 30 tablet, Rfl: 11   Collagen-Vitamin C-Biotin (COLLAGEN PO), Take 1 capsule by mouth daily with breakfast. (Patient not taking: Reported on 09/18/2023), Disp: , Rfl:    Glucosamine-Chondroit-Vit C-Mn (GLUCOSAMINE-CHONDROITIN) TABS, Take 1 tablet by mouth daily with breakfast. (Patient not taking: Reported on 09/18/2023), Disp: , Rfl:    metoprolol succinate (TOPROL-XL) 100 MG 24 hr tablet, TAKE 1 TABLET(100 MG) BY MOUTH DAILY, Disp: 30 tablet, Rfl: 0   nitroGLYCERIN (NITROSTAT) 0.4 MG SL tablet, Place 1 tablet (0.4 mg total) under the tongue every 5 (five) minutes as needed for chest pain., Disp: 25 tablet, Rfl: 3   Omega-3 Fatty Acids (FISH OIL PO), Take 1 capsule by mouth daily with breakfast. (Patient not taking: Reported on 09/18/2023), Disp: , Rfl:    PEPCID COMPLETE 10-800-165 MG chewable tablet, Chew 1 tablet by mouth 2 (two) times daily as needed (for heartburn/indigestion). (Patient not taking: Reported on 09/18/2023), Disp: , Rfl:    ticagrelor (BRILINTA) 90 MG TABS tablet, Take 1 tablet (90 mg total) by  mouth 2 (two) times daily., Disp: 60 tablet, Rfl: 11  Past Medical History: Past Medical History:  Diagnosis Date   Essential hypertension    Sinus arrest 12/27/2016   Hattie Perch 12/27/2016    Tobacco Use: Social History   Tobacco Use  Smoking Status Never  Smokeless Tobacco Never    Labs: Review Flowsheet       Latest Ref Rng & Units 10/25/2023  Labs for ITP Cardiac and Pulmonary Rehab  Cholestrol 100 - 199 mg/dL 403   LDL (calc) 0 - 99 mg/dL 67   HDL-C >47 mg/dL 41   Trlycerides 0 - 425 mg/dL 83     Capillary Blood Glucose: No results found for: "GLUCAP"   Exercise Target Goals: Exercise Program Goal: Individual exercise prescription set using results from initial 6 min walk test and THRR while considering  patient's activity barriers and safety.   Exercise Prescription Goal: Initial exercise prescription builds to 30-45 minutes a day of aerobic activity, 2-3 days per week.  Home exercise guidelines will be given to patient during program as part of exercise prescription that the participant will acknowledge.  Activity Barriers & Risk Stratification:  Activity Barriers & Cardiac Risk Stratification - 09/18/23 0938       Activity Barriers & Cardiac Risk Stratification   Activity Barriers Chest Pain/Angina    Cardiac Risk Stratification High             6 Minute Walk:  6 Minute Walk     Row Name 09/18/23 [phone]  6 Minute Walk   Phase Initial     Distance 1836 feet     Walk Time 6 minutes     # of Rest Breaks 0     MPH 3.5     METS 3.9     RPE 13     Perceived Dyspnea  0     VO2 Peak 13.65     Symptoms No     Resting HR 63 bpm     Resting BP 118/70     Resting Oxygen Saturation  96 %     Exercise Oxygen Saturation  during 6 min walk 99 %     Max Ex. HR 103 bpm     Max Ex. BP 136/80     2 Minute Post BP 120/80              Oxygen Initial Assessment:   Oxygen Re-Evaluation:   Oxygen Discharge (Final Oxygen  Re-Evaluation):   Initial Exercise Prescription:  Initial Exercise Prescription - 09/18/23 1000       Date of Initial Exercise RX and Referring Provider   Date 09/18/23    Referring Provider Yates Decamp, MD    Expected Discharge Date 12/12/23      Bike   Level 2    Watts 35    Minutes 15    METs 3.9      Recumbant Elliptical   Level 2    RPM 60    Watts 35    Minutes 15    METs 3.9      Prescription Details   Frequency (times per week) 3    Duration Progress to 30 minutes of continuous aerobic without signs/symptoms of physical distress      Intensity   THRR 40-80% of Max Heartrate 60-120    Ratings of Perceived Exertion 11-13    Perceived Dyspnea 0-4      Progression   Progression Continue progressive overload as per policy without signs/symptoms or physical distress.      Resistance Training   Training Prescription Yes    Weight 4 lbs    Reps 10-15             Perform Capillary Blood Glucose checks as needed.  Exercise Prescription Changes:   Exercise Prescription Changes     Row Name 09/24/23 1100 10/10/23 1015 10/17/23 1600 10/26/23 1400 11/09/23 1400     Response to Exercise   Blood Pressure (Admit) 110/68 126/70 120/78 124/68 112/70   Blood Pressure (Exercise) 142/64 152/88 124/72 -- --   Blood Pressure (Exit) 120/70 105/58 108/70 108/70 116/66   Heart Rate (Admit) 61 bpm 63 bpm 73 bpm 65 bpm 61 bpm   Heart Rate (Exercise) 98 bpm 121 bpm 119 bpm 107 bpm 103 bpm   Heart Rate (Exit) 63 bpm 59 bpm 70 bpm 63 bpm 59 bpm   Rating of Perceived Exertion (Exercise) 11 11 12 12 14    Symptoms None None None None None   Comments Pt's first day in the CRP2 program Reviewed METs Reviewed home exercise Reviewed METs/Goals Reviewed METs   Duration Continue with 30 min of aerobic exercise without signs/symptoms of physical distress. Continue with 30 min of aerobic exercise without signs/symptoms of physical distress. Continue with 30 min of aerobic exercise  without signs/symptoms of physical distress. Continue with 30 min of aerobic exercise without signs/symptoms of physical distress. Continue with 30 min of aerobic exercise without signs/symptoms of physical distress.   Intensity THRR unchanged THRR  unchanged THRR unchanged THRR unchanged THRR unchanged     Progression   Progression Continue to progress workloads to maintain intensity without signs/symptoms of physical distress. Continue to progress workloads to maintain intensity without signs/symptoms of physical distress. Continue to progress workloads to maintain intensity without signs/symptoms of physical distress. Continue to progress workloads to maintain intensity without signs/symptoms of physical distress. Continue to progress workloads to maintain intensity without signs/symptoms of physical distress.   Average METs 3.85 4.85 5 5.55 5.75     Resistance Training   Training Prescription Yes No No Yes Yes   Weight 4 lbs No weights on Wednesdays No weights on Wednesdays 4 lb wts 5 lbs   Reps 10-15 -- -- 10-15 10-15   Time 10 Minutes -- -- 10 Minutes 10 Minutes     Interval Training   Interval Training No No No No No     Bike   Level 2 2 3 3 4    Watts 37 67 62 80 88   Minutes 15 15 15 15 15    METs 3.3 4.4 4.1 4.9 4.9     Recumbant Elliptical   Level 2 3 3 3 4    RPM 77 85 92 97 96   Watts 104 129 137 144 159   Minutes 15 15 15 15 15    METs 4.4 5.3 5.9 6.2 6.6     Home Exercise Plan   Plans to continue exercise at -- -- Home (comment) Home (comment) Home (comment)   Frequency -- -- Add 2 additional days to program exercise sessions. Add 2 additional days to program exercise sessions. Add 2 additional days to program exercise sessions.   Initial Home Exercises Provided -- -- 10/17/23 10/17/23 10/17/23            Exercise Comments:   Exercise Comments     Row Name 09/24/23 1107 10/10/23 1015 10/17/23 1616 10/26/23 1442 11/09/23 1414   Exercise Comments Pt's first day in  the CRP2 program. Pt exercised with no complaints and is off to a good start. Reviewed METs. Pt is making good progress with no complaints. Reviewed home exercise RX. Pt is walking at home on his off days from the CRP2 program. Pt will continue this. Pt verbalized understanding of the home exercise Rx and was provided a copy. Reviewed METs and goals. Pt is making progress. Reviewed METs. Continues to make good progress.            Exercise Goals and Review:   Exercise Goals     Row Name 09/18/23 [phone]             Exercise Goals   Increase Physical Activity Yes       Intervention Provide advice, education, support and counseling about physical activity/exercise needs.;Develop an individualized exercise prescription for aerobic and resistive training based on initial evaluation findings, risk stratification, comorbidities and participant's personal goals.       Expected Outcomes Short Term: Attend rehab on a regular basis to increase amount of physical activity.;Long Term: Exercising regularly at least 3-5 days a week.;Long Term: Add in home exercise to make exercise part of routine and to increase amount of physical activity.       Increase Strength and Stamina Yes       Intervention Develop an individualized exercise prescription for aerobic and resistive training based on initial evaluation findings, risk stratification, comorbidities and participant's personal goals.;Provide advice, education, support and counseling about physical activity/exercise needs.  Expected Outcomes Short Term: Increase workloads from initial exercise prescription for resistance, speed, and METs.;Short Term: Perform resistance training exercises routinely during rehab and add in resistance training at home;Long Term: Improve cardiorespiratory fitness, muscular endurance and strength as measured by increased METs and functional capacity ( )       Able to understand and use rate of perceived exertion (RPE)  scale Yes       Intervention Provide education and explanation on how to use RPE scale       Expected Outcomes Short Term: Able to use RPE daily in rehab to express subjective intensity level;Long Term:  Able to use RPE to guide intensity level when exercising independently       Knowledge and understanding of Target Heart Rate Range (THRR) Yes       Intervention Provide education and explanation of THRR including how the numbers were predicted and where they are located for reference       Expected Outcomes Short Term: Able to state/look up THRR;Long Term: Able to use THRR to govern intensity when exercising independently;Short Term: Able to use daily as guideline for intensity in rehab       Understanding of Exercise Prescription Yes       Intervention Provide education, explanation, and written materials on patient's individual exercise prescription       Expected Outcomes Short Term: Able to explain program exercise prescription;Long Term: Able to explain home exercise prescription to exercise independently                Exercise Goals Re-Evaluation :  Exercise Goals Re-Evaluation     Row Name 09/24/23 1106 10/26/23 1440           Exercise Goal Re-Evaluation   Exercise Goals Review Increase Physical Activity;Increase Strength and Stamina;Able to understand and use rate of perceived exertion (RPE) scale;Knowledge and understanding of Target Heart Rate Range (THRR);Understanding of Exercise Prescription Increase Physical Activity;Increase Strength and Stamina;Able to understand and use rate of perceived exertion (RPE) scale;Knowledge and understanding of Target Heart Rate Range (THRR);Understanding of Exercise Prescription      Comments Pt's first day in the CRP2 program. Pt understands the exercise Rx, RPE scale and THRR. Reviewed METs and goals. Pt is making good progress on METs level. Peak METs are 6.9. Pt voices progress on his goal of increased strength. Pt has goal of weight loss  and has lost 1 kg.      Expected Outcomes Will continue to monitor patient and progress exercise workloads as tolerated. Will continue to monitor patient and progress exercise workloads as tolerated.               Discharge Exercise Prescription (Final Exercise Prescription Changes):  Exercise Prescription Changes - 11/09/23 1400       Response to Exercise   Blood Pressure (Admit) 112/70    Blood Pressure (Exit) 116/66    Heart Rate (Admit) 61 bpm    Heart Rate (Exercise) 103 bpm    Heart Rate (Exit) 59 bpm    Rating of Perceived Exertion (Exercise) 14    Symptoms None    Comments Reviewed METs    Duration Continue with 30 min of aerobic exercise without signs/symptoms of physical distress.    Intensity THRR unchanged      Progression   Progression Continue to progress workloads to maintain intensity without signs/symptoms of physical distress.    Average METs 5.75      Resistance Training   Training Prescription Yes  Weight 5 lbs    Reps 10-15    Time 10 Minutes      Interval Training   Interval Training No      Bike   Level 4    Watts 88    Minutes 15    METs 4.9      Recumbant Elliptical   Level 4    RPM 96    Watts 159    Minutes 15    METs 6.6      Home Exercise Plan   Plans to continue exercise at Home (comment)    Frequency Add 2 additional days to program exercise sessions.    Initial Home Exercises Provided 10/17/23             Nutrition:  Target Goals: Understanding of nutrition guidelines, daily intake of sodium 1500mg , cholesterol 200mg , calories 30% from fat and 7% or less from saturated fats, daily to have 5 or more servings of fruits and vegetables.  Biometrics:  Pre Biometrics - 09/18/23 0810       Pre Biometrics   Waist Circumference 43 inches    Hip Circumference 40.5 inches    Waist to Hip Ratio 1.06 %    Triceps Skinfold 15 mm    % Body Fat 29.1 %    Grip Strength 46 kg    Flexibility 0 in   could not reach   Single  Leg Stand 25.37 seconds              Nutrition Therapy Plan and Nutrition Goals:  Nutrition Therapy & Goals - 11/07/23 0832       Nutrition Therapy   Diet Heart Healthy Diet    Drug/Food Interactions Statins/Certain Fruits      Personal Nutrition Goals   Nutrition Goal Patient to identify strategies for reducing cardiovascular risk by attending the [facility] education and nutrition series weekly.   goal in progress.   Personal Goal #2 Patient to improve diet quality by using the plate method as a guide for meal planning to include lean protein/plant protein, fruits, vegetables, whole grains, nonfat dairy as part of a well-balanced diet.   goal in progress.   Personal Goal #3 Patient to identify strategies for weight loss with goal of 0.5-2.0# per week.   goal in progress   Comments Goals in progress. Charles Ellison continues to attend the [facility] education/nutrition series regularly. He has medical history of CAD s/p NSTEMI, hyperlipidemia, HTN, PAF. Lipids have improved but not at goal of <55. He is down 3.5# since starting with our program. Patient will continue to benefit from participation in intensive cardiac rehab for nutrition, exercise, and lifestyle modification.      Intervention Plan   Intervention Prescribe, educate and counsel regarding individualized specific dietary modifications aiming towards targeted core components such as weight, hypertension, lipid management, diabetes, heart failure and other comorbidities.;Nutrition handout(s) given to patient.    Expected Outcomes Short Term Goal: Understand basic principles of dietary content, such as calories, fat, sodium, cholesterol and nutrients.;Long Term Goal: Adherence to prescribed nutrition plan.             Nutrition Assessments:  MEDIFICTS Score Key: >=70 Need to make dietary changes  40-70 Heart Healthy Diet <= 40 Therapeutic Level Cholesterol Diet   Flowsheet Row INTENSIVE CARDIAC REHAB from 09/24/2023 in [facility]  Picture Your Plate Total Score on Admission 68      Picture Your Plate Scores: <  40 Unhealthy dietary pattern with much room for improvement. 41-50 Dietary pattern unlikely to meet recommendations for good health and room for improvement. 51-60 More healthful dietary pattern, with some room for improvement.  >60 Healthy dietary pattern, although there may be some specific behaviors that could be improved.    Nutrition Goals Re-Evaluation:  Nutrition Goals Re-Evaluation     Row Name 10/09/23 1417 11/07/23 0832           Goals   Current Weight 200 lb 9.9 oz (91 kg) 197 lb 8.5 oz (89.6 kg)      Comment Lpa WNL, cholesterol 243, HDL 52, LDL 157, triglycerides 187 LDL 67, AST 43, ALT 58; other most recent labs Lpa WNL      Expected Outcome Goals in progress. Charles Ellison continues to attend the [facility] education/nutrition series regularly. He has medical history of CAD s/p NSTEMI, hyperlipidemia, HTN, PAF. Lipids remain elevated. He has maintained his weight since starting with our program. Patient will benefit from participation in intensive cardiac rehab for nutrition, exercise, and lifestyle modification. Goals in progress. Charles Ellison continues to attend the [facility] education/nutrition series regularly. He has medical history of CAD s/p NSTEMI, hyperlipidemia, HTN, PAF. Lipids have improved but not at goal of <55. He is down 3.5# since starting with our program. Patient will continue to benefit from participation in intensive cardiac rehab for nutrition, exercise, and lifestyle modification.               Nutrition Goals Re-Evaluation:  Nutrition Goals Re-Evaluation     Row Name 10/09/23 1417 11/07/23 0832           Goals   Current Weight 200 lb 9.9 oz (91 kg) 197 lb 8.5 oz (89.6 kg)      Comment Lpa WNL, cholesterol 243, HDL 52, LDL 157, triglycerides 187 LDL 67, AST 43, ALT 58; other most recent labs Lpa WNL       Expected Outcome Goals in progress. Charles Ellison continues to attend the [facility] education/nutrition series regularly. He has medical history of CAD s/p NSTEMI, hyperlipidemia, HTN, PAF. Lipids remain elevated. He has maintained his weight since starting with our program. Patient will benefit from participation in intensive cardiac rehab for nutrition, exercise, and lifestyle modification. Goals in progress. Charles Ellison continues to attend the [facility] education/nutrition series regularly. He has medical history of CAD s/p NSTEMI, hyperlipidemia, HTN, PAF. Lipids have improved but not at goal of <55. He is down 3.5# since starting with our program. Patient will continue to benefit from participation in intensive cardiac rehab for nutrition, exercise, and lifestyle modification.               Nutrition Goals Discharge (Final Nutrition Goals Re-Evaluation):  Nutrition Goals Re-Evaluation - 11/07/23 0454       Goals   Current Weight 197 lb 8.5 oz (89.6 kg)    Comment LDL 67, AST 43, ALT 58; other most recent labs Lpa WNL    Expected Outcome Goals in progress. Charles Ellison continues to attend the [facility] education/nutrition series regularly. He has medical history of CAD s/p NSTEMI, hyperlipidemia, HTN, PAF. Lipids have improved but not at goal of <55. He is down 3.5# since starting with our program. Patient will continue to benefit from participation in intensive cardiac rehab for nutrition, exercise, and lifestyle modification.             Psychosocial: Target Goals: Acknowledge presence or absence of significant depression and/or stress, maximize coping skills, provide positive support system. Participant is  able to verbalize types and ability to use techniques and skills needed for reducing stress and depression.  Initial Review & Psychosocial Screening:  Initial Psych Review & Screening - 09/18/23 1015       Initial Review   Current issues with Current Depression;Current Sleep Concerns;Current  Stress Concerns    Source of Stress Concerns Occupation    Comments Pt with h/o of depression. Has seen therapist/physciatrist in the past. Does not feel that he needs to see anyone at this time.      Family Dynamics   Good Support System? Yes   Has wife, Misty Stanley, for support     Barriers   Psychosocial barriers to participate in program The patient should benefit from training in stress management and relaxation.      Screening Interventions   Interventions Encouraged to exercise    Expected Outcomes Short Term goal: Utilizing psychosocial counselor, staff and physician to assist with identification of specific Stressors or current issues interfering with healing process. Setting desired goal for each stressor or current issue identified.;Long Term Goal: Stressors or current issues are controlled or eliminated.;Short Term goal: Identification and review with participant of any Quality of Life or Depression concerns found by scoring the questionnaire.;Long Term goal: The participant improves quality of Life and PHQ9 Scores as seen by post scores and/or verbalization of changes             Quality of Life Scores:  Quality of Life - 09/18/23 1018       Quality of Life   Select Quality of Life      Quality of Life Scores   Health/Function Pre 25.5 %    Socioeconomic Pre 26.21 %    Psych/Spiritual Pre 19.57 %    Family Pre 26.4 %    GLOBAL Pre 24.56 %            Scores of 19 and below usually indicate a poorer quality of life in these areas.  A difference of  2-3 points is a clinically meaningful difference.  A difference of 2-3 points in the total score of the Quality of Life Index has been associated with significant improvement in overall quality of life, self-image, physical symptoms, and general health in studies assessing change in quality of life.  PHQ-9: Review Flowsheet       09/18/2023 01/13/2015  Depression screen PHQ 2/9  Decreased Interest 1 0  Down, Depressed,  Hopeless 1 0  PHQ - 2 Score 2 0  Altered sleeping 2 -  Tired, decreased energy 0 -  Change in appetite 1 -  Feeling bad or failure about yourself  1 -  Trouble concentrating 0 -  Moving slowly or fidgety/restless 0 -  Suicidal thoughts 0 -  PHQ-9 Score 6 -  Difficult doing work/chores Somewhat difficult -   Interpretation of Total Score  Total Score Depression Severity:  1-4 = Minimal depression, 5-9 = Mild depression, 10-14 = Moderate depression, 15-19 = Moderately severe depression, 20-27 = Severe depression   Psychosocial Evaluation and Intervention:   Psychosocial Re-Evaluation:  Psychosocial Re-Evaluation     Row Name 09/25/23 1403 10/16/23 1039 11/12/23 1103         Psychosocial Re-Evaluation   Current issues with History of Depression;Current Sleep Concerns;Current Stress Concerns History of Depression;Current Sleep Concerns;Current Stress Concerns History of Depression;Current Sleep Concerns;Current Stress Concerns     Comments Charles Ellison started cardiac rehab on 09/24/23. Charles Ellison denied having any increased concerns or stressors on his first  day of exercise. Charles Ellison continues not to voice  any increased concerns or stressors during exercise at cardiac rehab. Charles Ellison continues not to voice  any increased concerns or stressors during exercise at cardiac rehab.     Expected Outcomes Charles Ellison will have controlled or decreased stressors upon completion of cardiac rehab Charles Ellison will have controlled or decreased stressors upon completion of cardiac rehab Charles Ellison will have controlled or decreased stressors upon completion of cardiac rehab     Interventions Stress management education;Relaxation education;Encouraged to attend Cardiac Rehabilitation for the exercise Stress management education;Relaxation education;Encouraged to attend Cardiac Rehabilitation for the exercise Stress management education;Relaxation education;Encouraged to attend Cardiac Rehabilitation for the exercise     Continue  Psychosocial Services  Follow up required by staff Follow up required by staff No Follow up required       Initial Review   Source of Stress Concerns Occupation Occupation Occupation     Comments Will continue to monitor and offer support as needed Will continue to monitor and offer support as needed Will continue to monitor and offer support as needed              Psychosocial Discharge (Final Psychosocial Re-Evaluation):  Psychosocial Re-Evaluation - 11/12/23 1103       Psychosocial Re-Evaluation   Current issues with History of Depression;Current Sleep Concerns;Current Stress Concerns    Comments Charles Ellison continues not to voice  any increased concerns or stressors during exercise at cardiac rehab.    Expected Outcomes Charles Ellison will have controlled or decreased stressors upon completion of cardiac rehab    Interventions Stress management education;Relaxation education;Encouraged to attend Cardiac Rehabilitation for the exercise    Continue Psychosocial Services  No Follow up required      Initial Review   Source of Stress Concerns Occupation    Comments Will continue to monitor and offer support as needed             Vocational Rehabilitation: Provide vocational rehab assistance to qualifying candidates.   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 Participant will state understanding/return demonstration of topics presented.    Education     Row Name 09/24/23 0900     Education   Cardiac Education Topics Pritikin   Select Workshops     Workshops   Educator Exercise Physiologist   Select Exercise   Exercise Workshop Location manager and Fall  Prevention   Instruction Review Code 1- Verbalizes Understanding   Class Start Time [phone]   Class Stop Time 0848   Class Time Calculation (min) 34 min    Row Name 09/26/23 0900     Education   Cardiac Education Topics Pritikin   Secondary school teacher School   Educator Respiratory Therapist;Nurse   Weekly Topic Adding Flavor - Sodium-Free   Instruction Review Code 1- Verbalizes Understanding   Class Start Time 0815   Class Stop Time 0851   Class Time Calculation (min) 36 min    Row Name 09/28/23 0800     Education   Cardiac Education Topics Pritikin   Select Core Videos     Core Videos  Educator Dietitian   Select Nutrition   Nutrition Overview of the Pritikin Eating Plan   Instruction Review Code 1- Verbalizes Understanding   Class Start Time 0815   Class Stop Time 0900   Class Time Calculation (min) 45 min    Row Name 10/01/23 0900     Education   Cardiac Education Topics Pritikin   Select Core Videos     Core Videos   Educator Exercise Physiologist   Select Psychosocial   Psychosocial Healthy Minds, Bodies, Hearts   Instruction Review Code 1- Verbalizes Understanding   Class Start Time 0813   Class Stop Time 0847   Class Time Calculation (min) 34 min    Row Name 10/03/23 0900     Education   Cardiac Education Topics Pritikin   Secondary school teacher School   Educator Respiratory Therapist;Nurse   Weekly Topic Fast and Healthy Breakfasts   Instruction Review Code 1- Verbalizes Understanding   Class Start Time [phone]   Class Stop Time 0844   Class Time Calculation (min) 32 min    Row Name 10/05/23 0900     Education   Cardiac Education Topics Pritikin   Select Core Videos     Core Videos   Educator Dietitian   Select Nutrition   Nutrition Other  Label Reading   Instruction Review Code 1- Verbalizes Understanding   Class Start Time 0815   Class Stop Time 0853   Class Time Calculation (min) 38 min    Row Name 10/08/23  0900     Education   Cardiac Education Topics Pritikin   Select Core Videos     Core Videos   Educator Dietitian   Select Nutrition   Nutrition Becoming a Pritikin Chef   Instruction Review Code 1- Verbalizes Understanding   Class Start Time 0815   Class Stop Time 0856   Class Time Calculation (min) 41 min    Row Name 10/10/23 0900     Education   Cardiac Education Topics Pritikin   Secondary school teacher School   Educator Dietitian;Respiratory Therapist   Weekly Topic Personalizing Your Pritikin Plate   Instruction Review Code 1- Verbalizes Understanding   Class Start Time 0815   Class Stop Time 0848   Class Time Calculation (min) 33 min    Row Name 10/12/23 1100     Education   Cardiac Education Topics Pritikin   Glass blower/designer Nutrition   Nutrition Workshop Label Reading   Instruction Review Code 1- Verbalizes Understanding   Class Start Time 0815   Class Stop Time 0900   Class Time Calculation (min) 45 min    Row Name 10/15/23 1000     Education   Cardiac Education Topics Pritikin   Select Workshops     Workshops   Educator Exercise Physiologist   Select Psychosocial   Psychosocial Workshop Recognizing and Reducing Stress   Instruction Review Code 1- Verbalizes Understanding   Class Start Time 0815   Class Stop Time 0858   Class Time Calculation (min) 43 min    Row Name 10/17/23 1000     Education   Cardiac Education Topics Pritikin   Customer service manager   Weekly Topic [employer]   Instruction Review Code 1- Verbalizes Understanding   Class Start Time 0815   Class Stop Time  0900   Class Time Calculation (min) 45 min    Row Name 10/22/23 0900     Education   Cardiac Education Topics Pritikin   Select Workshops     Workshops   Educator Exercise Physiologist   Select Exercise   Exercise Workshop Exercise Basics: Building Your  Action Plan   Instruction Review Code 1- Verbalizes Understanding   Class Start Time 0810   Class Stop Time 0853   Class Time Calculation (min) 43 min    Row Name 10/24/23 0900     Education   Cardiac Education Topics Pritikin   Orthoptist   Educator Exercise Physiologist   Weekly Topic Efficiency Cooking - Meals in a Snap   Instruction Review Code 1- Verbalizes Understanding   Class Start Time 0815   Class Stop Time 0850   Class Time Calculation (min) 35 min    Row Name 10/26/23 0800     Education   Cardiac Education Topics Pritikin   Psychologist, forensic Exercise Education   Exercise Education Move It!   Instruction Review Code 1- Verbalizes Understanding   Class Start Time [phone]   Class Stop Time 0845   Class Time Calculation (min) 33 min    Row Name 10/29/23 0900     Education   Cardiac Education Topics Pritikin   Glass blower/designer Nutrition   Nutrition Workshop Targeting Your Nutrition Priorities   Instruction Review Code 1- Verbalizes Understanding   Class Start Time [phone]   Class Stop Time 0856   Class Time Calculation (min) 39 min    Row Name 10/31/23 0800     Education   Cardiac Education Topics Pritikin   Orthoptist   Educator Dietitian   Weekly Topic One-Pot Wonders   Instruction Review Code 1- Verbalizes Understanding   Class Start Time 0815   Class Stop Time 0900   Class Time Calculation (min) 45 min    Row Name 11/02/23 0800     Education   Cardiac Education Topics Pritikin   Psychologist, forensic General Education   General Education Hypertension and Heart Disease   Instruction Review Code 1- Verbalizes Understanding   Class Start Time 0813   Class Stop Time 0844   Class Time Calculation (min) 31 min    Row Name  11/05/23 0800     Education   Cardiac Education Topics Pritikin   Select Workshops     Workshops   Educator Exercise Physiologist   Select Psychosocial   Psychosocial Workshop Focused Goals, Sustainable Changes   Instruction Review Code 1- Verbalizes Understanding   Class Start Time 0813   Class Stop Time 0846   Class Time Calculation (min) 33 min    Row Name 11/07/23 0800     Education   Cardiac Education Topics Pritikin   Orthoptist   Educator Dietitian   Weekly Topic Comforting Weekend Breakfasts   Instruction Review Code 1- Verbalizes Understanding   Class Start Time 0815   Class Stop Time 0851   Class Time Calculation (min) 36 min    Row Name 11/09/23 0900     Education   Cardiac Education  Topics Pritikin   Select Core Videos     Core Videos   Educator Dietitian   Select Nutrition   Nutrition Dining Out - Part 1   Instruction Review Code 1- Verbalizes Understanding   Class Start Time 0815   Class Stop Time 0848   Class Time Calculation (min) 33 min    Row Name 11/12/23 0900     Education   Cardiac Education Topics Pritikin   Select Core Videos     Core Videos   Educator Exercise Physiologist   Select Exercise Education   Exercise Education Biomechanial Limitations   Instruction Review Code 1- Verbalizes Understanding   Class Start Time 0808   Class Stop Time 0841   Class Time Calculation (min) 33 min            Core Videos: Exercise    Move It!  Clinical staff conducted group or individual video education with verbal and written material and guidebook.  Patient learns the recommended Pritikin exercise program. Exercise with the goal of living a long, healthy life. Some of the health benefits of exercise include controlled diabetes, healthier blood pressure levels, improved cholesterol levels, improved heart and lung capacity, improved sleep, and better body composition. Everyone should speak with their doctor  before starting or changing an exercise routine.  Biomechanical Limitations Clinical staff conducted group or individual video education with verbal and written material and guidebook.  Patient learns how biomechanical limitations can impact exercise and how we can mitigate and possibly overcome limitations to have an impactful and balanced exercise routine.  Body Composition Clinical staff conducted group or individual video education with verbal and written material and guidebook.  Patient learns that body composition (ratio of muscle mass to fat mass) is a key component to assessing overall fitness, rather than body weight alone. Increased fat mass, especially visceral belly fat, can put [place] at increased risk for metabolic syndrome, type 2 diabetes, heart disease, and even death. It is recommended to combine diet and exercise (cardiovascular and resistance training) to improve your body composition. Seek guidance from your physician and exercise physiologist before implementing an exercise routine.  Exercise Action Plan Clinical staff conducted group or individual video education with verbal and written material and guidebook.  Patient learns the recommended strategies to achieve and enjoy long-term exercise adherence, including variety, self-motivation, self-efficacy, and positive decision making. Benefits of exercise include fitness, good health, weight management, more energy, better sleep, less stress, and overall well-being.  Medical   Heart Disease Risk Reduction Clinical staff conducted group or individual video education with verbal and written material and guidebook.  Patient learns our heart is our most vital organ as it circulates oxygen, nutrients, white blood cells, and hormones throughout the entire body, and carries waste away. Data supports a plant-based eating plan like the Pritikin Program for its effectiveness in slowing progression of and reversing heart disease. The video  provides a number of recommendations to address heart disease.   Metabolic Syndrome and Belly Fat  Clinical staff conducted group or individual video education with verbal and written material and guidebook.  Patient learns what metabolic syndrome is, how it leads to heart disease, and how one can reverse it and keep it from coming back. You have metabolic syndrome if you have 3 of the following 5 criteria: abdominal obesity, high blood pressure, high triglycerides, low HDL cholesterol, and high blood sugar.  Hypertension and Heart Disease Clinical staff conducted group or individual video education with verbal and  written material and guidebook.  Patient learns that high blood pressure, or hypertension, is very common in the [place]. Hypertension is largely due to excessive salt intake, but other important risk factors include being overweight, physical inactivity, drinking too much alcohol, smoking, and not eating enough potassium from fruits and vegetables. High blood pressure is a leading risk factor for heart attack, stroke, congestive heart failure, dementia, kidney failure, and premature death. Long-term effects of excessive salt intake include stiffening of the arteries and thickening of heart muscle and organ damage. Recommendations include ways to reduce hypertension and the risk of heart disease.  Diseases of Our Time - Focusing on Diabetes Clinical staff conducted group or individual video education with verbal and written material and guidebook.  Patient learns why the best way to stop diseases of our time is prevention, through food and other lifestyle changes. Medicine (such as prescription pills and surgeries) is often only a Band-Aid on the problem, not a long-term solution. Most common diseases of our time include obesity, type 2 diabetes, hypertension, heart disease, and cancer. The Pritikin Program is recommended and has been proven to help reduce, reverse, and/or prevent the  damaging effects of metabolic syndrome.  Nutrition   Overview of the Pritikin Eating Plan  Clinical staff conducted group or individual video education with verbal and written material and guidebook.  Patient learns about the Pritikin Eating Plan for disease risk reduction. The Pritikin Eating Plan emphasizes a wide variety of unrefined, minimally-processed carbohydrates, like fruits, vegetables, whole grains, and legumes. Go, Caution, and Stop food choices are explained. Plant-based and lean animal proteins are emphasized. Rationale provided for low sodium intake for blood pressure control, low added sugars for blood sugar stabilization, and low added fats and oils for coronary artery disease risk reduction and weight management.  Calorie Density  Clinical staff conducted group or individual video education with verbal and written material and guidebook.  Patient learns about calorie density and how it impacts the Pritikin Eating Plan. Knowing the characteristics of the food you choose will help you decide whether those foods will lead to weight gain or weight loss, and whether you want to consume more or less of them. Weight loss is usually a side effect of the Pritikin Eating Plan because of its focus on low calorie-dense foods.  Label Reading  Clinical staff conducted group or individual video education with verbal and written material and guidebook.  Patient learns about the Pritikin recommended label reading guidelines and corresponding recommendations regarding calorie density, added sugars, sodium content, and whole grains.  Dining Out - Part 1  Clinical staff conducted group or individual video education with verbal and written material and guidebook.  Patient learns that restaurant meals can be sabotaging because they can be so high in calories, fat, sodium, and/or sugar. Patient learns recommended strategies on how to positively address this and avoid unhealthy pitfalls.  Facts on Fats   Clinical staff conducted group or individual video education with verbal and written material and guidebook.  Patient learns that lifestyle modifications can be just as effective, if not more so, as many medications for lowering your risk of heart disease. A Pritikin lifestyle can help to reduce your risk of inflammation and atherosclerosis (cholesterol build-up, or plaque, in the artery walls). Lifestyle interventions such as dietary choices and physical activity address the cause of atherosclerosis. A review of the types of fats and their impact on blood cholesterol levels, along with dietary recommendations to reduce fat intake is  also included.  Nutrition Action Plan  Clinical staff conducted group or individual video education with verbal and written material and guidebook.  Patient learns how to incorporate Pritikin recommendations into their lifestyle. Recommendations include planning and keeping personal health goals in mind as an important part of their success.  Healthy Mind-Set    Healthy Minds, Bodies, Hearts  Clinical staff conducted group or individual video education with verbal and written material and guidebook.  Patient learns how to identify when they are stressed. Video will discuss the impact of that stress, as well as the many benefits of stress management. Patient will also be introduced to stress management techniques. The way we think, act, and feel has an impact on our hearts.  How Our Thoughts Can Heal Our Hearts  Clinical staff conducted group or individual video education with verbal and written material and guidebook.  Patient learns that negative thoughts can cause depression and anxiety. This can result in negative lifestyle behavior and serious health problems. Cognitive behavioral therapy is an effective method to help control our thoughts in order to change and improve our emotional outlook.  Additional Videos:  Exercise    Improving Performance  Clinical  staff conducted group or individual video education with verbal and written material and guidebook.  Patient learns to use a non-linear approach by alternating intensity levels and lengths of time spent exercising to help burn more calories and lose more body fat. Cardiovascular exercise helps improve heart health, metabolism, hormonal balance, blood sugar control, and recovery from fatigue. Resistance training improves strength, endurance, balance, coordination, reaction time, metabolism, and muscle mass. Flexibility exercise improves circulation, posture, and balance. Seek guidance from your physician and exercise physiologist before implementing an exercise routine and learn your capabilities and proper form for Ellison exercise.  Introduction to Yoga  Clinical staff conducted group or individual video education with verbal and written material and guidebook.  Patient learns about yoga, a discipline of the coming together of mind, breath, and body. The benefits of yoga include improved flexibility, improved range of motion, better posture and core strength, increased lung function, weight loss, and positive self-image. Yoga's heart health benefits include lowered blood pressure, healthier heart rate, decreased cholesterol and triglyceride levels, improved immune function, and reduced stress. Seek guidance from your physician and exercise physiologist before implementing an exercise routine and learn your capabilities and proper form for Ellison exercise.  Medical   Aging: Enhancing Your Quality of Life  Clinical staff conducted group or individual video education with verbal and written material and guidebook.  Patient learns key strategies and recommendations to stay in good physical health and enhance quality of life, such as prevention strategies, having an advocate, securing a Health Care Proxy and Power of Attorney, and keeping a list of medications and system for tracking them. It also discusses how to  avoid risk for bone loss.  Biology of Weight Control  Clinical staff conducted group or individual video education with verbal and written material and guidebook.  Patient learns that weight gain occurs because we consume more calories than we burn (eating more, moving less). Even if your body weight is normal, you may have higher ratios of fat compared to muscle mass. Too much body fat puts you at increased risk for cardiovascular disease, heart attack, stroke, type 2 diabetes, and obesity-related cancers. In addition to exercise, following the Pritikin Eating Plan can help reduce your risk.  Decoding Lab Results  Clinical staff conducted group or individual video education with  verbal and written material and guidebook.  Patient learns that lab test reflects one measurement whose values change over time and are influenced by many factors, including medication, stress, sleep, exercise, food, hydration, pre-existing medical conditions, and more. It is recommended to use the knowledge from this video to become more involved with your lab results and evaluate your numbers to speak with your doctor.   Diseases of Our Time - Overview  Clinical staff conducted group or individual video education with verbal and written material and guidebook.  Patient learns that according to the CDC, 50% to 70% of chronic diseases (such as obesity, type 2 diabetes, elevated lipids, hypertension, and heart disease) are avoidable through lifestyle improvements including healthier food choices, listening to satiety cues, and increased physical activity.  Sleep Disorders Clinical staff conducted group or individual video education with verbal and written material and guidebook.  Patient learns how good quality and duration of sleep are important to overall health and well-being. Patient also learns about sleep disorders and how they impact health along with recommendations to address them, including discussing with a  physician.  Nutrition  Dining Out - Part 2 Clinical staff conducted group or individual video education with verbal and written material and guidebook.  Patient learns how to plan ahead and communicate in order to maximize their dining experience in a healthy and nutritious manner. Included are recommended food choices based on the type of restaurant the patient is visiting.   Fueling a Banker conducted group or individual video education with verbal and written material and guidebook.  There is a strong connection between our food choices and our health. Diseases like obesity and type 2 diabetes are very prevalent and are in large-part due to lifestyle choices. The Pritikin Eating Plan provides plenty of food and hunger-curbing satisfaction. It is easy to follow, affordable, and helps reduce health risks.  Menu Workshop  Clinical staff conducted group or individual video education with verbal and written material and guidebook.  Patient learns that restaurant meals can sabotage health goals because they are often packed with calories, fat, sodium, and sugar. Recommendations include strategies to plan ahead and to communicate with the manager, chef, or server to help order a healthier meal.  Planning Your Eating Strategy  Clinical staff conducted group or individual video education with verbal and written material and guidebook.  Patient learns about the Pritikin Eating Plan and its benefit of reducing the risk of disease. The Pritikin Eating Plan does not focus on calories. Instead, it emphasizes high-quality, nutrient-rich foods. By knowing the characteristics of the foods, we choose, we can determine their calorie density and make informed decisions.  Targeting Your Nutrition Priorities  Clinical staff conducted group or individual video education with verbal and written material and guidebook.  Patient learns that lifestyle habits have a tremendous impact on disease  risk and progression. This video provides eating and physical activity recommendations based on your personal health goals, such as reducing LDL cholesterol, losing weight, preventing or controlling type 2 diabetes, and reducing high blood pressure.  Vitamins and Minerals  Clinical staff conducted group or individual video education with verbal and written material and guidebook.  Patient learns different ways to obtain key vitamins and minerals, including through a recommended healthy diet. It is important to discuss Ellison supplements you take with your doctor.   Healthy Mind-Set    Smoking Cessation  Clinical staff conducted group or individual video education with verbal and written material and  guidebook.  Patient learns that cigarette smoking and tobacco addiction pose a serious health risk which affects millions of people. Stopping smoking will significantly reduce the risk of heart disease, lung disease, and many forms of cancer. Recommended strategies for quitting are covered, including working with your doctor to develop a successful plan.  Culinary   Becoming a Set designer conducted group or individual video education with verbal and written material and guidebook.  Patient learns that cooking at home can be healthy, cost-effective, quick, and puts them in control. Keys to cooking healthy recipes will include looking at your recipe, assessing your equipment needs, planning ahead, making it simple, choosing cost-effective seasonal ingredients, and limiting the use of added fats, salts, and sugars.  Cooking - Breakfast and Snacks  Clinical staff conducted group or individual video education with verbal and written material and guidebook.  Patient learns how important breakfast is to satiety and nutrition through the entire day. Recommendations include key foods to eat during breakfast to help stabilize blood sugar levels and to prevent overeating at meals later in the day.  Planning ahead is also a key component.  Cooking - Educational psychologist conducted group or individual video education with verbal and written material and guidebook.  Patient learns eating strategies to improve overall health, including an approach to cook more at home. Recommendations include thinking of animal protein as a side on your plate rather than center stage and focusing instead on lower calorie dense options like vegetables, fruits, whole grains, and plant-based proteins, such as beans. Making sauces in large quantities to freeze for later and leaving the skin on your vegetables are also recommended to maximize your experience.  Cooking - Healthy Salads and Dressing Clinical staff conducted group or individual video education with verbal and written material and guidebook.  Patient learns that vegetables, fruits, whole grains, and legumes are the foundations of the Pritikin Eating Plan. Recommendations include how to incorporate each of these in flavorful and healthy salads, and how to create homemade salad dressings. Proper handling of ingredients is also covered. Cooking - Soups and [employer] - Soups and Desserts Clinical staff conducted group or individual video education with verbal and written material and guidebook.  Patient learns that Pritikin soups and desserts make for easy, nutritious, and delicious snacks and meal components that are low in sodium, fat, sugar, and calorie density, while high in vitamins, minerals, and filling fiber. Recommendations include simple and healthy ideas for soups and desserts.   Overview     The Pritikin Solution Program Overview Clinical staff conducted group or individual video education with verbal and written material and guidebook.  Patient learns that the results of the Pritikin Program have been documented in more than 100 articles published in peer-reviewed journals, and the benefits include reducing risk factors for  (and, in some cases, even reversing) high cholesterol, high blood pressure, type 2 diabetes, obesity, and more! An overview of the three key pillars of the Pritikin Program will be covered: eating well, doing regular exercise, and having a healthy mind-set.  WORKSHOPS  Exercise: Exercise Basics: Building Your Action Plan Clinical staff led group instruction and group discussion with PowerPoint presentation and patient guidebook. To enhance the learning environment the use of posters, models and videos may be added. At the conclusion of this workshop, patients will comprehend the difference between physical activity and exercise, as well as the benefits of incorporating both, into their routine. Patients will  understand the FITT (Frequency, Intensity, Time, and Type) principle and how to use it to build an exercise action plan. In addition, safety concerns and other considerations for exercise and cardiac rehab will be addressed by the presenter. The purpose of this lesson is to promote a comprehensive and effective weekly exercise routine in order to improve patients' overall level of fitness.   Managing Heart Disease: Your Path to a Healthier Heart Clinical staff led group instruction and group discussion with PowerPoint presentation and patient guidebook. To enhance the learning environment the use of posters, models and videos may be added.At the conclusion of this workshop, patients will understand the anatomy and physiology of the heart. Additionally, they will understand how Pritikin's three pillars impact the risk factors, the progression, and the management of heart disease.  The purpose of this lesson is to provide a high-level overview of the heart, heart disease, and how the Pritikin lifestyle positively impacts risk factors.  Exercise Biomechanics Clinical staff led group instruction and group discussion with PowerPoint presentation and patient guidebook. To enhance the learning  environment the use of posters, models and videos may be added. Patients will learn how the structural parts of their bodies function and how these functions impact their daily activities, movement, and exercise. Patients will learn how to promote a neutral spine, learn how to manage pain, and identify ways to improve their physical movement in order to promote healthy living. The purpose of this lesson is to expose patients to common physical limitations that impact physical activity. Participants will learn practical ways to adapt and manage aches and pains, and to minimize their effect on regular exercise. Patients will learn how to maintain good posture while sitting, walking, and lifting.  Balance Training and Fall Prevention  Clinical staff led group instruction and group discussion with PowerPoint presentation and patient guidebook. To enhance the learning environment the use of posters, models and videos may be added. At the conclusion of this workshop, patients will understand the importance of their sensorimotor skills (vision, proprioception, and the vestibular system) in maintaining their ability to balance as they age. Patients will apply a variety of balancing exercises that are appropriate for their current level of function. Patients will understand the common causes for poor balance, possible solutions to these problems, and ways to modify their physical environment in order to minimize their fall risk. The purpose of this lesson is to teach patients about the importance of maintaining balance as they age and ways to minimize their risk of falling.  WORKSHOPS   Nutrition:  Fueling a Ship broker led group instruction and group discussion with PowerPoint presentation and patient guidebook. To enhance the learning environment the use of posters, models and videos may be added. Patients will review the foundational principles of the Pritikin Eating Plan and understand  what constitutes a serving size in each of the food groups. Patients will also learn Pritikin-friendly foods that are better choices when away from home and review make-ahead meal and snack options. Calorie density will be reviewed and applied to three nutrition priorities: weight maintenance, weight loss, and weight gain. The purpose of this lesson is to reinforce (in a group setting) the key concepts around what patients are recommended to eat and how to apply these guidelines when away from home by planning and selecting Pritikin-friendly options. Patients will understand how calorie density may be adjusted for different weight management goals.  Mindful Eating  Clinical staff led group instruction and group discussion  with PowerPoint presentation and patient guidebook. To enhance the learning environment the use of posters, models and videos may be added. Patients will briefly review the concepts of the Pritikin Eating Plan and the importance of low-calorie dense foods. The concept of mindful eating will be introduced as well as the importance of paying attention to internal hunger signals. Triggers for non-hunger eating and techniques for dealing with triggers will be explored. The purpose of this lesson is to provide patients with the opportunity to review the basic principles of the Pritikin Eating Plan, discuss the value of eating mindfully and how to measure internal cues of hunger and fullness using the Hunger Scale. Patients will also discuss reasons for non-hunger eating and learn strategies to use for controlling emotional eating.  Targeting Your Nutrition Priorities Clinical staff led group instruction and group discussion with PowerPoint presentation and patient guidebook. To enhance the learning environment the use of posters, models and videos may be added. Patients will learn how to determine their genetic susceptibility to disease by reviewing their family history. Patients will gain  insight into the importance of diet as part of an overall healthy lifestyle in mitigating the impact of genetics and other environmental insults. The purpose of this lesson is to provide patients with the opportunity to assess their personal nutrition priorities by looking at their family history, their own health history and current risk factors. Patients will also be able to discuss ways of prioritizing and modifying the Pritikin Eating Plan for their highest risk areas  Menu  Clinical staff led group instruction and group discussion with PowerPoint presentation and patient guidebook. To enhance the learning environment the use of posters, models and videos may be added. Using menus brought in from [employer], or printed from [employer], patients will apply the Pritikin dining out guidelines that were presented in the [employer] video. Patients will also be able to practice these guidelines in a variety of provided scenarios. The purpose of this lesson is to provide patients with the opportunity to practice hands-on learning of the Pritikin Dining Out guidelines with actual menus and practice scenarios.  Label Reading Clinical staff led group instruction and group discussion with PowerPoint presentation and patient guidebook. To enhance the learning environment the use of posters, models and videos may be added. Patients will review and discuss the Pritikin label reading guidelines presented in Pritikin's Label Reading Educational series video. Using fool labels brought in from local grocery stores and markets, patients will apply the label reading guidelines and determine if the packaged food meet the Pritikin guidelines. The purpose of this lesson is to provide patients with the opportunity to review, discuss, and practice hands-on learning of the Pritikin Label Reading guidelines with actual packaged food labels. Cooking School  Pritikin's [employer] are  designed to teach patients ways to prepare quick, simple, and affordable recipes at home. The importance of nutrition's role in chronic disease risk reduction is reflected in its emphasis in the overall Pritikin program. By learning how to prepare essential core Pritikin Eating Plan recipes, patients will increase control over what they eat; be able to customize the flavor of foods without the use of added salt, sugar, or fat; and improve the quality of the food they consume. By learning a set of core recipes which are easily assembled, quickly prepared, and affordable, patients are more likely to prepare more healthy foods at home. These workshops focus on convenient breakfasts, simple entres, side dishes, and desserts  which can be prepared with minimal effort and are consistent with nutrition recommendations for cardiovascular risk reduction. Cooking [employer] are taught by a Armed forces logistics/support/administrative officer (RD) who has been trained by the [employer]. The chef or RD has a clear understanding of the importance of minimizing - if not completely eliminating - added fat, sugar, and sodium in recipes. Throughout the series of Cooking School Workshop sessions, patients will learn about healthy ingredients and efficient methods of cooking to build confidence in their capability to prepare    Cooking School weekly topics:  Adding Flavor- Sodium-Free  Fast and Healthy Breakfasts  Powerhouse Plant-Based Proteins  Satisfying Salads and Dressings  Simple Sides and Sauces  International Cuisine-Spotlight on the [employer] Zones  Delicious Desserts  Savory Soups  [employer] - Meals in a Astronomer Appetizers and Snacks  Comforting Weekend Breakfasts  One-Pot Wonders   Fast Evening Meals  Landscape architect Your Pritikin Plate  WORKSHOPS   Healthy Mindset (Psychosocial):  Focused Goals, Sustainable Changes Clinical staff led group instruction and group discussion  with PowerPoint presentation and patient guidebook. To enhance the learning environment the use of posters, models and videos may be added. Patients will be able to apply effective goal setting strategies to establish at least one personal goal, and then take consistent, meaningful action toward that goal. They will learn to identify common barriers to achieving personal goals and develop strategies to overcome them. Patients will also gain an understanding of how our mind-set can impact our ability to achieve goals and the importance of cultivating a positive and growth-oriented mind-set. The purpose of this lesson is to provide patients with a deeper understanding of how to set and achieve personal goals, as well as the tools and strategies needed to overcome common obstacles which may arise along the way.  From Head to Heart: The Power of a Healthy Outlook  Clinical staff led group instruction and group discussion with PowerPoint presentation and patient guidebook. To enhance the learning environment the use of posters, models and videos may be added. Patients will be able to recognize and describe the impact of emotions and mood on physical health. They will discover the importance of self-care and explore self-care practices which may work for them. Patients will also learn how to utilize the 4 C's to cultivate a healthier outlook and better manage stress and challenges. The purpose of this lesson is to demonstrate to patients how a healthy outlook is an essential part of maintaining good health, especially as they continue their cardiac rehab journey.  Healthy Sleep for a Healthy Heart Clinical staff led group instruction and group discussion with PowerPoint presentation and patient guidebook. To enhance the learning environment the use of posters, models and videos may be added. At the conclusion of this workshop, patients will be able to demonstrate knowledge of the importance of sleep to overall  health, well-being, and quality of life. They will understand the symptoms of, and treatments for, common sleep disorders. Patients will also be able to identify daytime and nighttime behaviors which impact sleep, and they will be able to apply these tools to help manage sleep-related challenges. The purpose of this lesson is to provide patients with a general overview of sleep and outline the importance of quality sleep. Patients will learn about a few of the most common sleep disorders. Patients will also be introduced to the concept of "sleep hygiene," and discover ways to self-manage certain sleeping problems  through simple daily behavior changes. Finally, the workshop will motivate patients by clarifying the links between quality sleep and their goals of heart-healthy living.   Recognizing and Reducing Stress Clinical staff led group instruction and group discussion with PowerPoint presentation and patient guidebook. To enhance the learning environment the use of posters, models and videos may be added. At the conclusion of this workshop, patients will be able to understand the types of stress reactions, differentiate between acute and chronic stress, and recognize the impact that chronic stress has on their health. They will also be able to apply different coping mechanisms, such as reframing negative self-talk. Patients will have the opportunity to practice a variety of stress management techniques, such as deep abdominal breathing, progressive muscle relaxation, and/or guided imagery.  The purpose of this lesson is to educate patients on the role of stress in their lives and to provide healthy techniques for coping with it.  Learning Barriers/Preferences:  Learning Barriers/Preferences - 09/18/23 1018       Learning Barriers/Preferences   Learning Barriers Sight;Hearing   wears glasses and bilateral hearing aids   Learning Preferences Skilled Demonstration;Individual Instruction;Group Instruction              Education Topics:  Knowledge Questionnaire Score:  Knowledge Questionnaire Score - 09/18/23 1019       Knowledge Questionnaire Score   Pre Score 23/24             Core Components/Risk Factors/Patient Goals at Admission:  Personal Goals and Risk Factors at Admission - 09/18/23 1022       Core Components/Risk Factors/Patient Goals on Admission    Weight Management Yes;Weight Loss    Intervention Weight Management: Develop a combined nutrition and exercise program designed to reach desired caloric intake, while maintaining appropriate intake of nutrient and fiber, sodium and fats, and appropriate energy expenditure required for the weight goal.;Weight Management: Provide education and appropriate resources to help participant work on and attain dietary goals.;Weight Management/Obesity: Establish reasonable short term and long term weight goals.    Expected Outcomes Short Term: Continue to assess and modify interventions until short term weight is achieved;Long Term: Adherence to nutrition and physical activity/exercise program aimed toward attainment of established weight goal;Weight Loss: Understanding of general recommendations for a balanced deficit meal plan, which promotes 1-2 lb weight loss per week and includes a negative energy balance of [phone] kcal/d;Understanding recommendations for meals to include 15-35% energy as protein, 25-35% energy from fat, 35-60% energy from carbohydrates, less than 200mg  of dietary cholesterol, 20-35 gm of total fiber daily;Understanding of distribution of calorie intake throughout the day with the consumption of 4-5 meals/snacks    Hypertension Yes    Intervention Provide education on lifestyle modifcations including regular physical activity/exercise, weight management, moderate sodium restriction and increased consumption of fresh fruit, vegetables, and low fat dairy, alcohol moderation, and smoking cessation.;Monitor prescription use  compliance.    Expected Outcomes Short Term: Continued assessment and intervention until BP is < 140/82mm HG in hypertensive participants. < 130/68mm HG in hypertensive participants with diabetes, heart failure or chronic kidney disease.;Long Term: Maintenance of blood pressure at goal levels.    Lipids Yes    Intervention Provide education and support for participant on nutrition & aerobic/resistive exercise along with prescribed medications to achieve LDL 70mg , HDL >40mg .    Expected Outcomes Short Term: Participant states understanding of desired cholesterol values and is compliant with medications prescribed. Participant is following exercise prescription and nutrition guidelines.;Long Term: Cholesterol controlled  with medications as prescribed, with individualized exercise RX and with personalized nutrition plan. Value goals: LDL < 70mg , HDL > 40 mg.    Stress Yes    Intervention Offer individual and/or small group education and counseling on adjustment to heart disease, stress management and health-related lifestyle change. Teach and support self-help strategies.    Expected Outcomes Short Term: Participant demonstrates changes in health-related behavior, relaxation and other stress management skills, ability to obtain effective social support, and compliance with psychotropic medications if prescribed.;Long Term: Emotional wellbeing is indicated by absence of clinically significant psychosocial distress or social isolation.             Core Components/Risk Factors/Patient Goals Review:   Goals and Risk Factor Review     Row Name 09/25/23 1410 10/16/23 1041 11/12/23 1103         Core Components/Risk Factors/Patient Goals Review   Personal Goals Review Weight Management/Obesity;Lipids;Hypertension;Stress Weight Management/Obesity;Lipids;Hypertension;Stress Weight Management/Obesity;Lipids;Hypertension;Stress     Review Charles Ellison started cardiac rehab on 09/24/23. Charles Ellison did well with  exercise. Vital signs were stable. Charles Ellison started cardiac rehab on 09/24/23. Charles Ellison continues to do  well with exercise. Vital signs have been stable. Charles Ellison has increased his met levels. Charles Ellison continues to do  well with exercise. Vital signs have been stable. Charles Ellison has increased his met levels. Charles Ellison has lost 2.5 kg since starting cardiac  rehab     Expected Outcomes Charles Ellison will continue to participate in cardiac rehab for exercise, nutrition and lifestyle modifications Charles Ellison will continue to participate in cardiac rehab for exercise, nutrition and lifestyle modifications Charles Ellison will continue to participate in cardiac rehab for exercise, nutrition and lifestyle modifications              Core Components/Risk Factors/Patient Goals at Discharge (Final Review):   Goals and Risk Factor Review - 11/12/23 1103       Core Components/Risk Factors/Patient Goals Review   Personal Goals Review Weight Management/Obesity;Lipids;Hypertension;Stress    Review Charles Ellison continues to do  well with exercise. Vital signs have been stable. Charles Ellison has increased his met levels. Charles Ellison has lost 2.5 kg since starting cardiac  rehab    Expected Outcomes Charles Ellison will continue to participate in cardiac rehab for exercise, nutrition and lifestyle modifications             ITP Comments:  ITP Comments     Row Name 09/18/23 0929 09/25/23 1357 10/16/23 1026 11/12/23 1102     ITP Comments Armanda Magic, MD: Medical Director.  Introduction to the Pritikin Education Program/Intensive Cardiac Rehab.  Initial orientation packet reviewed with the patient today. 30 Day ITP Review. Charles Ellison started cardiac rehab on 09/24/23. Charles Ellison did well with exercise. 30 Day ITP Review. Charles Ellison has good attendance and participation with exercise at cardiac rehab 30 Day ITP Review. Charles Ellison continues to have  good attendance and participation with exercise at cardiac rehab             Comments: See ITP comments.

## 2023-11-14 ENCOUNTER — Encounter (HOSPITAL_COMMUNITY)
Admission: RE | Admit: 2023-11-14 | Discharge: 2023-11-14 | Disposition: A | Discharge status: Home / Self Care | Payer: PPO | Source: Ambulatory Visit | Attending: Cardiology | Admitting: Cardiology

## 2023-11-14 DIAGNOSIS — I214 Non-ST elevation (NSTEMI) myocardial infarction: Secondary | ICD-10-CM | POA: Diagnosis not present

## 2023-11-14 DIAGNOSIS — Z955 Presence of coronary angioplasty implant and graft: Secondary | ICD-10-CM

## 2023-11-16 ENCOUNTER — Encounter (HOSPITAL_COMMUNITY): Payer: PPO

## 2023-11-19 ENCOUNTER — Encounter (HOSPITAL_COMMUNITY)
Admission: RE | Admit: 2023-11-19 | Discharge: 2023-11-19 | Disposition: A | Discharge status: Home / Self Care | Payer: PPO | Source: Ambulatory Visit | Attending: Cardiology

## 2023-11-19 DIAGNOSIS — I214 Non-ST elevation (NSTEMI) myocardial infarction: Secondary | ICD-10-CM | POA: Diagnosis not present

## 2023-11-19 DIAGNOSIS — Z955 Presence of coronary angioplasty implant and graft: Secondary | ICD-10-CM

## 2023-11-20 NOTE — Progress Notes (Signed)
 Remote pacemaker transmission.

## 2023-11-20 NOTE — Addendum Note (Signed)
 Addended by: Geralyn Flash D on: 11/20/2023 01:27 PM   Modules accepted: Orders

## 2023-11-21 ENCOUNTER — Ambulatory Visit: Payer: PPO | Attending: Cardiology | Admitting: Cardiology

## 2023-11-21 ENCOUNTER — Encounter (HOSPITAL_COMMUNITY)
Admission: RE | Admit: 2023-11-21 | Discharge: 2023-11-21 | Disposition: A | Discharge status: Home / Self Care | Payer: PPO | Source: Ambulatory Visit | Attending: Cardiology | Admitting: Cardiology

## 2023-11-21 ENCOUNTER — Encounter: Payer: Self-pay | Admitting: Cardiology

## 2023-11-21 VITALS — BP 126/78 | HR 64 | Ht 70.5 in | Wt 194.0 lb

## 2023-11-21 DIAGNOSIS — I4729 Other ventricular tachycardia: Secondary | ICD-10-CM | POA: Diagnosis not present

## 2023-11-21 DIAGNOSIS — I480 Paroxysmal atrial fibrillation: Secondary | ICD-10-CM

## 2023-11-21 DIAGNOSIS — I2510 Atherosclerotic heart disease of native coronary artery without angina pectoris: Secondary | ICD-10-CM

## 2023-11-21 DIAGNOSIS — Z955 Presence of coronary angioplasty implant and graft: Secondary | ICD-10-CM

## 2023-11-21 DIAGNOSIS — I495 Sick sinus syndrome: Secondary | ICD-10-CM | POA: Diagnosis not present

## 2023-11-21 DIAGNOSIS — I214 Non-ST elevation (NSTEMI) myocardial infarction: Secondary | ICD-10-CM | POA: Diagnosis not present

## 2023-11-21 DIAGNOSIS — Z950 Presence of cardiac pacemaker: Secondary | ICD-10-CM | POA: Diagnosis not present

## 2023-11-21 LAB — CUP PACEART INCLINIC DEVICE CHECK
Date Time Interrogation Session: 20250409164843
Implantable Lead Connection Status: 753985
Implantable Lead Connection Status: 753985
Implantable Lead Implant Date: 20180517
Implantable Lead Implant Date: 20180517
Implantable Lead Location: 753859
Implantable Lead Location: 753860
Implantable Lead Model: 5076
Implantable Lead Model: 5076
Implantable Pulse Generator Implant Date: 20180517

## 2023-11-21 NOTE — Progress Notes (Signed)
  Electrophysiology Office Note:   Date:  11/21/2023  ID:  Charles Boss., DOB 1951-07-30, MRN [id number]  Primary Cardiologist: Yates Decamp, MD Primary Heart Failure: None Electrophysiologist: Chaka Jefferys Jorja Loa, MD      History of Present Illness:   Charles Strohecker. is a 73 y.o. male with h/o hypertension, coronary artery disease, sick sinus syndrome seen today for routine electrophysiology followup.   He presented to the hospital 08/24/2023 with chest pain.  Left heart catheterization showed significant LAD stenosis post DES.  Echo at the time showed a preserved ejection fraction.  Since last being seen in our clinic the patient reports doing.  He is working with cardiac rehab and has not had any issues.  He is happy with his exercise capacity.  He plans to continue to exercise.  Aside from that, he has no acute complaints.  he denies chest pain, palpitations, dyspnea, PND, orthopnea, nausea, vomiting, dizziness, syncope, edema, weight gain, or early satiety.   Review of systems complete and found to be negative unless listed in HPI.      EP Information / Studies Reviewed:    EKG is not ordered today. EKG from 08/24/2023 reviewed which showed atrial paced rhythm, first-degree AV block      PPM Interrogation-  reviewed in detail today,  See PACEART report.  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  Risk Assessment/Calculations:             Physical Exam:   VS:  BP 126/78 (BP Location: Left Arm, Patient Position: Sitting, Cuff Size: Large)   Pulse 64   Ht 5' 10.5" (1.791 m)   Wt 194 lb (88 kg)   SpO2 98%   BMI 27.44 kg/m    Wt Readings from Last 3 Encounters:  11/21/23 194 lb (88 kg)  09/18/23 201 lb 1 oz (91.2 kg)  09/03/23 202 lb 9.6 oz (91.9 kg)     GEN: Well nourished, well developed in no acute distress NECK: No JVD; No carotid bruits CARDIAC: Regular rate and rhythm, no murmurs, rubs, gallops RESPIRATORY:   Clear to auscultation without rales, wheezing or rhonchi  ABDOMEN: Soft, non-tender, non-distended EXTREMITIES:  No edema; No deformity   ASSESSMENT AND PLAN:    SND s/p Medtronic PPM  Normal PPM function See Pace Art report Sensing, threshold, impedance within normal limits Programming reviewed and stable for patient No changes today  2.  Paroxysmal atrial fibrillation: Short episodes noted on device interrogation.  Burden remains less than 0.1%.  Charles Ellison continue to monitor.  3.  Coronary artery disease: Currently on aspirin and ticagrelor.  Post LAD stent 08/24/2023.  Plan per primary cardiology.  4.  Hypertension: Well-controlled  Disposition:   Follow up with EP APP in 12 months  Signed, Shila Kruczek Jorja Loa, MD

## 2023-11-21 NOTE — Patient Instructions (Signed)
 Medication Instructions:  Your physician recommends that you continue on your current medications as directed. Please refer to the Current Medication list given to you today.  *If you need a refill on your cardiac medications before your next appointment, please call your pharmacy*  Lab Work: None ordered.  If you have labs (blood work) drawn today and your tests are completely normal, you will receive your results only by: MyChart Message (if you have MyChart) OR A paper copy in the mail If you have any lab test that is abnormal or we need to change your treatment, we will call you to review the results.  Testing/Procedures: None ordered.   Follow-Up: At [facility], you and your health needs are our priority.  As part of our continuing mission to provide you with exceptional heart care, our providers are all part of one team.  This team includes your primary Cardiologist (physician) and Advanced Practice Providers or APPs (Physician Assistants and Nurse Practitioners) who all work together to provide you with the care you need, when you need it.  Your next appointment:    12 months with Dr Elberta Fortis PA      1st Floor: - Lobby - Registration  - Pharmacy  - Lab - Cafe  2nd Floor: - PV Lab - Diagnostic Testing (echo, CT, nuclear med)  3rd Floor: - Vacant  4th Floor: - TCTS (cardiothoracic surgery) - AFib Clinic - Structural Heart Clinic - Vascular Surgery  - Vascular Ultrasound  5th Floor: - [facility] (general and EP) - Clinical Pharmacy for coumadin, hypertension, lipid, weight-loss medications, and med management appointments    Valet parking services will be available as well.

## 2023-11-23 ENCOUNTER — Encounter (HOSPITAL_COMMUNITY)
Admission: RE | Admit: 2023-11-23 | Discharge: 2023-11-23 | Disposition: A | Discharge status: Home / Self Care | Payer: PPO | Source: Ambulatory Visit | Attending: Cardiology | Admitting: Cardiology

## 2023-11-23 DIAGNOSIS — Z955 Presence of coronary angioplasty implant and graft: Secondary | ICD-10-CM

## 2023-11-23 DIAGNOSIS — I214 Non-ST elevation (NSTEMI) myocardial infarction: Secondary | ICD-10-CM

## 2023-11-26 ENCOUNTER — Encounter (HOSPITAL_COMMUNITY): Payer: PPO

## 2023-11-28 ENCOUNTER — Encounter (HOSPITAL_COMMUNITY): Payer: PPO

## 2023-11-30 ENCOUNTER — Encounter (HOSPITAL_COMMUNITY)
Admission: RE | Admit: 2023-11-30 | Discharge: 2023-11-30 | Disposition: A | Discharge status: Home / Self Care | Payer: PPO | Source: Ambulatory Visit | Attending: Cardiology | Admitting: Cardiology

## 2023-11-30 DIAGNOSIS — I214 Non-ST elevation (NSTEMI) myocardial infarction: Secondary | ICD-10-CM | POA: Diagnosis not present

## 2023-11-30 DIAGNOSIS — Z955 Presence of coronary angioplasty implant and graft: Secondary | ICD-10-CM

## 2023-12-03 ENCOUNTER — Encounter (HOSPITAL_COMMUNITY)
Admission: RE | Admit: 2023-12-03 | Discharge: 2023-12-03 | Disposition: A | Discharge status: Home / Self Care | Payer: PPO | Source: Ambulatory Visit | Attending: Cardiology | Admitting: Cardiology

## 2023-12-03 DIAGNOSIS — I214 Non-ST elevation (NSTEMI) myocardial infarction: Secondary | ICD-10-CM | POA: Diagnosis not present

## 2023-12-03 DIAGNOSIS — Z955 Presence of coronary angioplasty implant and graft: Secondary | ICD-10-CM

## 2023-12-03 NOTE — Progress Notes (Signed)
 Cardiac Individual Treatment Plan  Patient Details  Name: Charles Ellison. MRN: [id number] Date of Birth: [birth year] Referring Provider:   Flowsheet Row INTENSIVE CARDIAC REHAB ORIENT from 09/18/2023 in [facility]  Referring Provider Gordy Bergamo, MD       Initial Encounter Date:  Flowsheet Row INTENSIVE CARDIAC REHAB ORIENT from 09/18/2023 in [facility] for Heart, Vascular, & Lung [facility]  Date 09/18/23       Visit Diagnosis: 08/24/23 NSTEMI (non-ST elevated myocardial infarction) (HCC)  08/24/23 Status post coronary artery stent placement  Patient's Home Medications on Admission:  Current Outpatient Medications:    aspirin  EC 81 MG tablet, Take 1 tablet (81 mg total) by mouth daily. Swallow whole., Disp: , Rfl:    atorvastatin  (LIPITOR) 80 MG tablet, Take 1 tablet (80 mg total) by mouth daily., Disp: 30 tablet, Rfl: 11   Collagen-Vitamin C-Biotin (COLLAGEN PO), Take 1 capsule by mouth daily with breakfast. (Patient not taking: Reported on 09/18/2023), Disp: , Rfl:    Glucosamine-Chondroit-Vit C-Mn (GLUCOSAMINE-CHONDROITIN) TABS, Take 1 tablet by mouth daily with breakfast. (Patient not taking: Reported on 09/18/2023), Disp: , Rfl:    metoprolol  succinate (TOPROL -XL) 100 MG 24 hr tablet, TAKE 1 TABLET(100 MG) BY MOUTH DAILY, Disp: 30 tablet, Rfl: 0   nitroGLYCERIN  (NITROSTAT ) 0.4 MG SL tablet, Place 1 tablet (0.4 mg total) under the tongue every 5 (five) minutes as needed for chest pain., Disp: 25 tablet, Rfl: 3   Omega-3 Fatty Acids (FISH OIL PO), Take 1 capsule by mouth daily with breakfast. (Patient not taking: Reported on 09/18/2023), Disp: , Rfl:    PEPCID  COMPLETE 10-800-165 MG chewable tablet, Chew 1 tablet by mouth 2 (two) times daily as needed (for heartburn/indigestion). (Patient not taking: Reported on 09/18/2023), Disp: , Rfl:    ticagrelor  (BRILINTA ) 90 MG TABS tablet, Take 1 tablet (90 mg total) by  mouth 2 (two) times daily., Disp: 60 tablet, Rfl: 11  Past Medical History: Past Medical History:  Diagnosis Date   Essential hypertension    Sinus arrest 12/27/2016   thelbert 12/27/2016    Tobacco Use: Social History   Tobacco Use  Smoking Status Never  Smokeless Tobacco Never    Labs: Review Flowsheet       Latest Ref Rng & Units 10/25/2023  Labs for ITP Cardiac and Pulmonary Rehab  Cholestrol 100 - 199 mg/dL 875   LDL (calc) 0 - 99 mg/dL 67   HDL-C >60 mg/dL 41   Trlycerides 0 - 850 mg/dL 83     Capillary Blood Glucose: No results found for: GLUCAP   Exercise Target Goals: Exercise Program Goal: Individual exercise prescription set using results from initial 6 min walk test and THRR while considering  patient's activity barriers and safety.   Exercise Prescription Goal: Initial exercise prescription builds to 30-45 minutes a day of aerobic activity, 2-3 days per week.  Home exercise guidelines will be given to patient during program as part of exercise prescription that the participant will acknowledge.  Activity Barriers & Risk Stratification:  Activity Barriers & Cardiac Risk Stratification - 09/18/23 0938       Activity Barriers & Cardiac Risk Stratification   Activity Barriers Chest Pain/Angina    Cardiac Risk Stratification High             6 Minute Walk:  6 Minute Walk     Row Name 09/18/23 [id number]  6 Minute Walk   Phase Initial     Distance 1836 feet     Walk Time 6 minutes     # of Rest Breaks 0     MPH 3.5     METS 3.9     RPE 13     Perceived Dyspnea  0     VO2 Peak 13.65     Symptoms No     Resting HR 63 bpm     Resting BP 118/70     Resting Oxygen Saturation  96 %     Exercise Oxygen Saturation  during 6 min walk 99 %     Max Ex. HR 103 bpm     Max Ex. BP 136/80     2 Minute Post BP 120/80              Oxygen Initial Assessment:   Oxygen Re-Evaluation:   Oxygen Discharge (Final Oxygen  Re-Evaluation):   Initial Exercise Prescription:  Initial Exercise Prescription - 09/18/23 1000       Date of Initial Exercise RX and Referring Provider   Date 09/18/23    Referring Provider Gordy Bergamo, MD    Expected Discharge Date 12/12/23      Bike   Level 2    Watts 35    Minutes 15    METs 3.9      Recumbant Elliptical   Level 2    RPM 60    Watts 35    Minutes 15    METs 3.9      Prescription Details   Frequency (times per week) 3    Duration Progress to 30 minutes of continuous aerobic without signs/symptoms of physical distress      Intensity   THRR 40-80% of Max Heartrate 60-120    Ratings of Perceived Exertion 11-13    Perceived Dyspnea 0-4      Progression   Progression Continue progressive overload as per policy without signs/symptoms or physical distress.      Resistance Training   Training Prescription Yes    Weight 4 lbs    Reps 10-15             Perform Capillary Blood Glucose checks as needed.  Exercise Prescription Changes:   Exercise Prescription Changes     Row Name 09/24/23 1100 10/10/23 1015 10/17/23 1600 10/26/23 1400 11/09/23 1400     Response to Exercise   Blood Pressure (Admit) 110/68 126/70 120/78 124/68 112/70   Blood Pressure (Exercise) 142/64 152/88 124/72 -- --   Blood Pressure (Exit) 120/70 105/58 108/70 108/70 116/66   Heart Rate (Admit) 61 bpm 63 bpm 73 bpm 65 bpm 61 bpm   Heart Rate (Exercise) 98 bpm 121 bpm 119 bpm 107 bpm 103 bpm   Heart Rate (Exit) 63 bpm 59 bpm 70 bpm 63 bpm 59 bpm   Rating of Perceived Exertion (Exercise) 11 11 12 12 14    Symptoms None None None None None   Comments Pt's first day in the CRP[facility] program Reviewed METs Reviewed home exercise Reviewed METs/Goals Reviewed METs   Duration Continue with 30 min of aerobic exercise without signs/symptoms of physical distress. Continue with 30 min of aerobic exercise without signs/symptoms of physical distress. Continue with 30 min of aerobic exercise  without signs/symptoms of physical distress. Continue with 30 min of aerobic exercise without signs/symptoms of physical distress. Continue with 30 min of aerobic exercise without signs/symptoms of physical distress.   Intensity THRR unchanged THRR  unchanged THRR unchanged THRR unchanged THRR unchanged     Progression   Progression Continue to progress workloads to maintain intensity without signs/symptoms of physical distress. Continue to progress workloads to maintain intensity without signs/symptoms of physical distress. Continue to progress workloads to maintain intensity without signs/symptoms of physical distress. Continue to progress workloads to maintain intensity without signs/symptoms of physical distress. Continue to progress workloads to maintain intensity without signs/symptoms of physical distress.   Average METs 3.85 4.85 5 5.55 5.75     Resistance Training   Training Prescription Yes No No Yes Yes   Weight 4 lbs No weights on Wednesdays No weights on Wednesdays 4 lb wts 5 lbs   Reps 10-15 -- -- 10-15 10-15   Time 10 Minutes -- -- 10 Minutes 10 Minutes     Interval Training   Interval Training No No No No No     Bike   Level 2 2 3 3 4    Watts 37 67 62 80 88   Minutes 15 15 15 15 15    METs 3.3 4.4 4.1 4.9 4.9     Recumbant Elliptical   Level 2 3 3 3 4    RPM 77 85 92 97 96   Watts 104 129 137 144 159   Minutes 15 15 15 15 15    METs 4.4 5.3 5.9 6.2 6.6     Home Exercise Plan   Plans to continue exercise at -- -- Home (comment) Home (comment) Home (comment)   Frequency -- -- Add 2 additional days to program exercise sessions. Add 2 additional days to program exercise sessions. Add 2 additional days to program exercise sessions.   Initial Home Exercises Provided -- -- 10/17/23 10/17/23 10/17/23    Row Name 11/23/23 1600             Response to Exercise   Blood Pressure (Admit) 122/78       Blood Pressure (Exit) 106/70       Heart Rate (Admit) 87 bpm       Heart  Rate (Exercise) 130 bpm       Heart Rate (Exit) 47 bpm       Rating of Perceived Exertion (Exercise) 13.5       Symptoms None       Comments Reviewed METs and goals       Duration Continue with 30 min of aerobic exercise without signs/symptoms of physical distress.       Intensity THRR unchanged         Progression   Progression Continue to progress workloads to maintain intensity without signs/symptoms of physical distress.       Average METs 5.65         Resistance Training   Training Prescription Yes       Weight 5 lbs       Reps 10-15       Time 10 Minutes         Interval Training   Interval Training No         Recumbant Bike   Level 4       RPM 81       Watts 82       Minutes 15       METs 5.3         Recumbant Elliptical   Level 4       RPM 90       Watts 142       Minutes 15  METs 6         Home Exercise Plan   Plans to continue exercise at Home (comment)       Frequency Add 2 additional days to program exercise sessions.       Initial Home Exercises Provided 10/17/23                Exercise Comments:   Exercise Comments     Row Name 09/24/23 1107 10/10/23 1015 10/17/23 1616 10/26/23 1442 11/09/23 1414   Exercise Comments Pt's first day in the [facility] program. Pt exercised with no complaints and is off to a good start. Reviewed METs. Pt is making good progress with no complaints. Reviewed home exercise RX. Pt is walking at home on his off days from the CRP2 program. Pt will continue this. Pt verbalized understanding of the home exercise Rx and was provided a copy. Reviewed METs and goals. Pt is making progress. Reviewed METs. Continues to make good progress.    Row Name 11/23/23 1631           Exercise Comments Reviewed MET and goals. Pt making good progress.                Exercise Goals and Review:   Exercise Goals     Row Name 09/18/23 [phone]             Exercise Goals   Increase Physical Activity Yes       Intervention Provide  advice, education, support and counseling about physical activity/exercise needs.;Develop an individualized exercise prescription for aerobic and resistive training based on initial evaluation findings, risk stratification, comorbidities and participant's personal goals.       Expected Outcomes Short Term: Attend rehab on a regular basis to increase amount of physical activity.;Long Term: Exercising regularly at least 3-5 days a week.;Long Term: Add in home exercise to make exercise part of routine and to increase amount of physical activity.       Increase Strength and Stamina Yes       Intervention Develop an individualized exercise prescription for aerobic and resistive training based on initial evaluation findings, risk stratification, comorbidities and participant's personal goals.;Provide advice, education, support and counseling about physical activity/exercise needs.       Expected Outcomes Short Term: Increase workloads from initial exercise prescription for resistance, speed, and METs.;Short Term: Perform resistance training exercises routinely during rehab and add in resistance training at home;Long Term: Improve cardiorespiratory fitness, muscular endurance and strength as measured by increased METs and functional capacity ( )       Able to understand and use rate of perceived exertion (RPE) scale Yes       Intervention Provide education and explanation on how to use RPE scale       Expected Outcomes Short Term: Able to use RPE daily in rehab to express subjective intensity level;Long Term:  Able to use RPE to guide intensity level when exercising independently       Knowledge and understanding of Target Heart Rate Range (THRR) Yes       Intervention Provide education and explanation of THRR including how the numbers were predicted and where they are located for reference       Expected Outcomes Short Term: Able to state/look up THRR;Long Term: Able to use THRR to govern intensity when  exercising independently;Short Term: Able to use daily as guideline for intensity in rehab       Understanding of Exercise Prescription Yes  Intervention Provide education, explanation, and written materials on patient's individual exercise prescription       Expected Outcomes Short Term: Able to explain program exercise prescription;Long Term: Able to explain home exercise prescription to exercise independently                Exercise Goals Re-Evaluation :  Exercise Goals Re-Evaluation     Row Name 09/24/23 1106 10/26/23 1440 11/23/23 1625         Exercise Goal Re-Evaluation   Exercise Goals Review Increase Physical Activity;Increase Strength and Stamina;Able to understand and use rate of perceived exertion (RPE) scale;Knowledge and understanding of Target Heart Rate Range (THRR);Understanding of Exercise Prescription Increase Physical Activity;Increase Strength and Stamina;Able to understand and use rate of perceived exertion (RPE) scale;Knowledge and understanding of Target Heart Rate Range (THRR);Understanding of Exercise Prescription Increase Physical Activity;Increase Strength and Stamina;Able to understand and use rate of perceived exertion (RPE) scale;Knowledge and understanding of Target Heart Rate Range (THRR);Understanding of Exercise Prescription     Comments Pt's first day in the [facility]2 program. Pt understands the exercise Rx, RPE scale and THRR. Reviewed METs and goals. Pt is making good progress on METs level. Peak METs are 6.9. Pt voices progress on his goal of increased strength. Pt has goal of weight loss and has lost 1 kg. Reviewed METs and goals. Pt is continues to make good progress on METs level. Pt continues to voice progress on his goal of increased strength. Pt has goal of weight loss and has lost 2.8 kg since starting the program.     Expected Outcomes Will continue to monitor patient and progress exercise workloads as tolerated. Will continue to monitor patient and  progress exercise workloads as tolerated. Will continue to monitor patient and progress exercise workloads as tolerated.              Discharge Exercise Prescription (Final Exercise Prescription Changes):  Exercise Prescription Changes - 11/23/23 1600       Response to Exercise   Blood Pressure (Admit) 122/78    Blood Pressure (Exit) 106/70    Heart Rate (Admit) 87 bpm    Heart Rate (Exercise) 130 bpm    Heart Rate (Exit) 47 bpm    Rating of Perceived Exertion (Exercise) 13.5    Symptoms None    Comments Reviewed METs and goals    Duration Continue with 30 min of aerobic exercise without signs/symptoms of physical distress.    Intensity THRR unchanged      Progression   Progression Continue to progress workloads to maintain intensity without signs/symptoms of physical distress.    Average METs 5.65      Resistance Training   Training Prescription Yes    Weight 5 lbs    Reps 10-15    Time 10 Minutes      Interval Training   Interval Training No      Recumbant Bike   Level 4    RPM 81    Watts 82    Minutes 15    METs 5.3      Recumbant Elliptical   Level 4    RPM 90    Watts 142    Minutes 15    METs 6      Home Exercise Plan   Plans to continue exercise at Home (comment)    Frequency Add 2 additional days to program exercise sessions.    Initial Home Exercises Provided 10/17/23  Nutrition:  Target Goals: Understanding of nutrition guidelines, daily intake of sodium 1500mg , cholesterol 200mg , calories 30% from fat and 7% or less from saturated fats, daily to have 5 or more servings of fruits and vegetables.  Biometrics:  Pre Biometrics - 09/18/23 0810       Pre Biometrics   Waist Circumference 43 inches    Hip Circumference 40.5 inches    Waist to Hip Ratio 1.06 %    Triceps Skinfold 15 mm    % Body Fat 29.1 %    Grip Strength 46 kg    Flexibility 0 in   could not reach   Single Leg Stand 25.37 seconds               Nutrition Therapy Plan and Nutrition Goals:  Nutrition Therapy & Goals - 11/07/23 0832       Nutrition Therapy   Diet Heart Healthy Diet    Drug/Food Interactions Statins/Certain Fruits      Personal Nutrition Goals   Nutrition Goal Patient to identify strategies for reducing cardiovascular risk by attending the [facility] education and nutrition series weekly.   goal in progress.   Personal Goal #2 Patient to improve diet quality by using the plate method as a guide for meal planning to include lean protein/plant protein, fruits, vegetables, whole grains, nonfat dairy as part of a well-balanced diet.   goal in progress.   Personal Goal #3 Patient to identify strategies for weight loss with goal of 0.5-2.0# per week.   goal in progress   Comments Goals in progress. Charles Ellison continues to attend the [facility] education/nutrition series regularly. He has medical history of CAD s/p NSTEMI, hyperlipidemia, HTN, PAF. Lipids have improved but not at goal of <55. He is down 3.5# since starting with our program. Patient will continue to benefit from participation in intensive cardiac rehab for nutrition, exercise, and lifestyle modification.      Intervention Plan   Intervention Prescribe, educate and counsel regarding individualized specific dietary modifications aiming towards targeted core components such as weight, hypertension, lipid management, diabetes, heart failure and other comorbidities.;Nutrition handout(s) given to patient.    Expected Outcomes Short Term Goal: Understand basic principles of dietary content, such as calories, fat, sodium, cholesterol and nutrients.;Long Term Goal: Adherence to prescribed nutrition plan.             Nutrition Assessments:  MEDIFICTS Score Key: >=70 Need to make dietary changes  40-70 Heart Healthy Diet <= 40 Therapeutic Level Cholesterol Diet   Flowsheet Row INTENSIVE CARDIAC REHAB from 09/24/2023 in [facility] for Heart,  Vascular, & Lung Health  Picture Your Plate Total Score on Admission 68      Picture Your Plate Scores: <59 Unhealthy dietary pattern with much room for improvement. 41-50 Dietary pattern unlikely to meet recommendations for good health and room for improvement. 51-60 More healthful dietary pattern, with some room for improvement.  >60 Healthy dietary pattern, although there may be some specific behaviors that could be improved.    Nutrition Goals Re-Evaluation:  Nutrition Goals Re-Evaluation     Row Name 10/09/23 1417 11/07/23 0832           Goals   Current Weight 200 lb 9.9 oz (91 kg) 197 lb 8.5 oz (89.6 kg)      Comment Lpa WNL, cholesterol 243, HDL 52, LDL 157, triglycerides 187 LDL 67, AST 43, ALT 58; other most recent labs Lpa WNL      Expected Outcome  Goals in progress. Charles Ellison continues to attend the [facility] education/nutrition series regularly. He has medical history of CAD s/p NSTEMI, hyperlipidemia, HTN, PAF. Lipids remain elevated. He has maintained his weight since starting with our program. Patient will benefit from participation in intensive cardiac rehab for nutrition, exercise, and lifestyle modification. Goals in progress. Charles Ellison continues to attend the [facility] education/nutrition series regularly. He has medical history of CAD s/p NSTEMI, hyperlipidemia, HTN, PAF. Lipids have improved but not at goal of <55. He is down 3.5# since starting with our program. Patient will continue to benefit from participation in intensive cardiac rehab for nutrition, exercise, and lifestyle modification.               Nutrition Goals Re-Evaluation:  Nutrition Goals Re-Evaluation     Row Name 10/09/23 1417 11/07/23 0832           Goals   Current Weight 200 lb 9.9 oz (91 kg) 197 lb 8.5 oz (89.6 kg)      Comment Lpa WNL, cholesterol 243, HDL 52, LDL 157, triglycerides 187 LDL 67, AST 43, ALT 58; other most recent labs Lpa WNL      Expected Outcome Goals in progress. Charles Ellison  continues to attend the [facility] education/nutrition series regularly. He has medical history of CAD s/p NSTEMI, hyperlipidemia, HTN, PAF. Lipids remain elevated. He has maintained his weight since starting with our program. Patient will benefit from participation in intensive cardiac rehab for nutrition, exercise, and lifestyle modification. Goals in progress. Charles Ellison continues to attend the [facility] education/nutrition series regularly. He has medical history of CAD s/p NSTEMI, hyperlipidemia, HTN, PAF. Lipids have improved but not at goal of <55. He is down 3.5# since starting with our program. Patient will continue to benefit from participation in intensive cardiac rehab for nutrition, exercise, and lifestyle modification.               Nutrition Goals Discharge (Final Nutrition Goals Re-Evaluation):  Nutrition Goals Re-Evaluation - 11/07/23 9167       Goals   Current Weight 197 lb 8.5 oz (89.6 kg)    Comment LDL 67, AST 43, ALT 58; other most recent labs Lpa WNL    Expected Outcome Goals in progress. Charles Ellison continues to attend the [facility] education/nutrition series regularly. He has medical history of CAD s/p NSTEMI, hyperlipidemia, HTN, PAF. Lipids have improved but not at goal of <55. He is down 3.5# since starting with our program. Patient will continue to benefit from participation in intensive cardiac rehab for nutrition, exercise, and lifestyle modification.             Psychosocial: Target Goals: Acknowledge presence or absence of significant depression and/or stress, maximize coping skills, provide positive support system. Participant is able to verbalize types and ability to use techniques and skills needed for reducing stress and depression.  Initial Review & Psychosocial Screening:  Initial Psych Review & Screening - 09/18/23 1015       Initial Review   Current issues with Current Depression;Current Sleep Concerns;Current Stress Concerns    Source of Stress Concerns  Occupation    Comments Pt with h/o of depression. Has seen therapist/physciatrist in the past. Does not feel that he needs to see anyone at this time.      Family Dynamics   Good Support System? Yes   Has wife, Olam, for support     Barriers   Psychosocial barriers to participate in program The patient should benefit from training in stress management and relaxation.  Screening Interventions   Interventions Encouraged to exercise    Expected Outcomes Short Term goal: Utilizing psychosocial counselor, staff and physician to assist with identification of specific Stressors or current issues interfering with healing process. Setting desired goal for each stressor or current issue identified.;Long Term Goal: Stressors or current issues are controlled or eliminated.;Short Term goal: Identification and review with participant of any Quality of Life or Depression concerns found by scoring the questionnaire.;Long Term goal: The participant improves quality of Life and PHQ9 Scores as seen by post scores and/or verbalization of changes             Quality of Life Scores:  Quality of Life - 09/18/23 1018       Quality of Life   Select Quality of Life      Quality of Life Scores   Health/Function Pre 25.5 %    Socioeconomic Pre 26.21 %    Psych/Spiritual Pre 19.57 %    Family Pre 26.4 %    GLOBAL Pre 24.56 %            Scores of 19 and below usually indicate a poorer quality of life in these areas.  A difference of  2-3 points is a clinically meaningful difference.  A difference of 2-3 points in the total score of the Quality of Life Index has been associated with significant improvement in overall quality of life, self-image, physical symptoms, and general health in studies assessing change in quality of life.  PHQ-9: Review Flowsheet       09/18/2023 01/13/2015  Depression screen PHQ 2/9  Decreased Interest 1 0  Down, Depressed, Hopeless 1 0  PHQ - 2 Score 2 0  Altered sleeping  2 -  Tired, decreased energy 0 -  Change in appetite 1 -  Feeling bad or failure about yourself  1 -  Trouble concentrating 0 -  Moving slowly or fidgety/restless 0 -  Suicidal thoughts 0 -  PHQ-9 Score 6 -  Difficult doing work/chores Somewhat difficult -   Interpretation of Total Score  Total Score Depression Severity:  1-4 = Minimal depression, 5-9 = Mild depression, 10-14 = Moderate depression, 15-19 = Moderately severe depression, 20-27 = Severe depression   Psychosocial Evaluation and Intervention:   Psychosocial Re-Evaluation:  Psychosocial Re-Evaluation     Row Name 09/25/23 1403 10/16/23 1039 11/12/23 1103 11/29/23 1430       Psychosocial Re-Evaluation   Current issues with History of Depression;Current Sleep Concerns;Current Stress Concerns History of Depression;Current Sleep Concerns;Current Stress Concerns History of Depression;Current Sleep Concerns;Current Stress Concerns History of Depression    Comments Charles Ellison started cardiac rehab on 09/24/23. Charles Ellison denied having any increased concerns or stressors on his first day of exercise. Charles Ellison continues not to voice  any increased concerns or stressors during exercise at cardiac rehab. Charles Ellison continues not to voice  any increased concerns or stressors during exercise at cardiac rehab. Charles Ellison continues not to voice  any increased concerns or stressors during exercise at cardiac rehab.    Expected Outcomes Charles Ellison will have controlled or decreased stressors upon completion of cardiac rehab Charles Ellison will have controlled or decreased stressors upon completion of cardiac rehab Charles Ellison will have controlled or decreased stressors upon completion of cardiac rehab Charles Ellison will have controlled or decreased stressors upon completion of cardiac rehab    Interventions Stress management education;Relaxation education;Encouraged to attend Cardiac Rehabilitation for the exercise Stress management education;Relaxation education;Encouraged to attend  Cardiac Rehabilitation for the exercise Stress management education;Relaxation  education;Encouraged to attend Cardiac Rehabilitation for the exercise Stress management education;Relaxation education;Encouraged to attend Cardiac Rehabilitation for the exercise    Continue Psychosocial Services  Follow up required by staff Follow up required by staff No Follow up required No Follow up required      Initial Review   Source of Stress Concerns Occupation Occupation Occupation Occupation    Comments Will continue to monitor and offer support as needed Will continue to monitor and offer support as needed Will continue to monitor and offer support as needed Will continue to monitor and offer support as needed             Psychosocial Discharge (Final Psychosocial Re-Evaluation):  Psychosocial Re-Evaluation - 11/29/23 1430       Psychosocial Re-Evaluation   Current issues with History of Depression    Comments Charles Ellison continues not to voice  any increased concerns or stressors during exercise at cardiac rehab.    Expected Outcomes Charles Ellison will have controlled or decreased stressors upon completion of cardiac rehab    Interventions Stress management education;Relaxation education;Encouraged to attend Cardiac Rehabilitation for the exercise    Continue Psychosocial Services  No Follow up required      Initial Review   Source of Stress Concerns Occupation    Comments Will continue to monitor and offer support as needed             Vocational Rehabilitation: Provide vocational rehab assistance to qualifying candidates.   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  Participant will state understanding/return demonstration of topics presented.    Education     Row Name 09/24/23 0900     Education   Cardiac Education Topics Pritikin   Select Workshops     Workshops   Educator Exercise Physiologist   Select Exercise   Exercise Workshop Location Manager and Fall Prevention   Instruction Review Code 1- Verbalizes Understanding   Class Start Time [phone]   Class Stop Time 0848   Class Time Calculation (min) 34 min    Row Name 09/26/23 0900     Education   Cardiac Education Topics Pritikin   Secondary School Teacher School   Educator Respiratory Therapist;Nurse   Weekly Topic Adding Flavor - Sodium-Free   Instruction Review Code 1- Verbalizes Understanding   Class Start Time 0815   Class Stop Time 0851   Class Time Calculation (min) 36 min    Row Name 09/28/23 0800     Education   Cardiac Education Topics Pritikin   Select Core Videos     Core Videos   Educator Dietitian   Select Nutrition   Nutrition Overview of the Pritikin Eating Plan   Instruction Review Code 1- Verbalizes Understanding   Class Start Time 0815   Class Stop Time 0900   Class Time Calculation (min) 45 min    Row Name 10/01/23 0900     Education   Cardiac Education Topics Pritikin   Nurse, Children's Exercise Physiologist   Select Psychosocial   Psychosocial Healthy Minds, Bodies, Hearts  Instruction Review Code 1- Verbalizes Understanding   Class Start Time 0813   Class Stop Time 0847   Class Time Calculation (min) 34 min    Row Name 10/03/23 0900     Education   Cardiac Education Topics Pritikin   Secondary School Teacher School   Educator Respiratory Therapist;Nurse   Weekly Topic Fast and Healthy Breakfasts   Instruction Review Code 1- Verbalizes Understanding   Class Start Time [phone]   Class Stop Time 0844   Class Time Calculation (min) 32 min    Row Name 10/05/23 0900     Education    Cardiac Education Topics Pritikin   Select Core Videos     Core Videos   Educator Dietitian   Select Nutrition   Nutrition Other  Label Reading   Instruction Review Code 1- Verbalizes Understanding   Class Start Time 0815   Class Stop Time 0853   Class Time Calculation (min) 38 min    Row Name 10/08/23 0900     Education   Cardiac Education Topics Pritikin   Select Core Videos     Core Videos   Educator Dietitian   Select Nutrition   Nutrition Becoming a Pritikin Chef   Instruction Review Code 1- Verbalizes Understanding   Class Start Time 0815   Class Stop Time 0856   Class Time Calculation (min) 41 min    Row Name 10/10/23 0900     Education   Cardiac Education Topics Pritikin   Secondary School Teacher School   Educator Dietitian;Respiratory Therapist   Weekly Topic Personalizing Your Pritikin Plate   Instruction Review Code 1- Verbalizes Understanding   Class Start Time 0815   Class Stop Time 0848   Class Time Calculation (min) 33 min    Row Name 10/12/23 1100     Education   Cardiac Education Topics Pritikin   Glass Blower/designer Nutrition   Nutrition Workshop Label Reading   Instruction Review Code 1- Verbalizes Understanding   Class Start Time 0815   Class Stop Time 0900   Class Time Calculation (min) 45 min    Row Name 10/15/23 1000     Education   Cardiac Education Topics Pritikin   Select Workshops     Workshops   Educator Exercise Physiologist   Select Psychosocial   Psychosocial Workshop Recognizing and Reducing Stress   Instruction Review Code 1- Verbalizes Understanding   Class Start Time 0815   Class Stop Time 0858   Class Time Calculation (min) 43 min    Row Name 10/17/23 1000     Education   Cardiac Education Topics Pritikin   Orthoptist   Educator Dietitian   Weekly Topic Delicious Desserts   Instruction Review Code 1- Verbalizes  Understanding   Class Start Time 0815   Class Stop Time 0900   Class Time Calculation (min) 45 min    Row Name 10/22/23 0900     Education   Cardiac Education Topics Pritikin   Select Workshops     Workshops   Educator Exercise Physiologist   Select Exercise   Exercise Workshop Exercise Basics: Building Your Action Plan   Instruction Review Code 1- Verbalizes Understanding   Class Start Time 0810   Class Stop Time 0853   Class Time Calculation (min) 43 min    Row Name 10/24/23  0900     Education   Cardiac Education Topics Pritikin   Set Designer Exercise Physiologist   Weekly Topic Efficiency Cooking - Meals in a Snap   Instruction Review Code 1- Verbalizes Understanding   Class Start Time 0815   Class Stop Time 0850   Class Time Calculation (min) 35 min    Row Name 10/26/23 0800     Education   Cardiac Education Topics Pritikin   Select Core Videos     Core Videos   Educator Exercise Physiologist   Select Exercise Education   Exercise Education Move It!   Instruction Review Code 1- Verbalizes Understanding   Class Start Time [phone]   Class Stop Time 0845   Class Time Calculation (min) 33 min    Row Name 10/29/23 0900     Education   Cardiac Education Topics Pritikin   Glass Blower/designer Nutrition   Nutrition Workshop Targeting Your Nutrition Priorities   Instruction Review Code 1- Verbalizes Understanding   Class Start Time [phone]   Class Stop Time 0856   Class Time Calculation (min) 39 min    Row Name 10/31/23 0800     Education   Cardiac Education Topics Pritikin   Orthoptist   Educator Dietitian   Weekly Topic One-Pot Wonders   Instruction Review Code 1- Verbalizes Understanding   Class Start Time 0815   Class Stop Time 0900   Class Time Calculation (min) 45 min    Row Name 11/02/23 0800     Education   Cardiac Education Topics  Pritikin   Psychologist, Forensic General Education   General Education Hypertension and Heart Disease   Instruction Review Code 1- Verbalizes Understanding   Class Start Time 0813   Class Stop Time 0844   Class Time Calculation (min) 31 min    Row Name 11/05/23 0800     Education   Cardiac Education Topics Pritikin   Select Workshops     Workshops   Educator Exercise Physiologist   Select Psychosocial   Psychosocial Workshop Focused Goals, Sustainable Changes   Instruction Review Code 1- Verbalizes Understanding   Class Start Time 0813   Class Stop Time 0846   Class Time Calculation (min) 33 min    Row Name 11/07/23 0800     Education   Cardiac Education Topics Pritikin   Orthoptist   Educator Dietitian   Weekly Topic Comforting Weekend Breakfasts   Instruction Review Code 1- Verbalizes Understanding   Class Start Time 0815   Class Stop Time [phone]   Class Time Calculation (min) 36 min    Row Name 11/09/23 0900     Education   Cardiac Education Topics Pritikin   Select Core Videos     Core Videos   Educator Dietitian   Select Nutrition   Nutrition Dining Out - Part 1   Instruction Review Code 1- Verbalizes Understanding   Class Start Time 0815   Class Stop Time 0848   Class Time Calculation (min) 33 min    Row Name 11/12/23 0900     Education   Cardiac Education Topics Pritikin   Nurse, Children's Exercise  Physiologist   Select Exercise Education   Exercise Education Biomechanial Limitations   Instruction Review Code 1- Verbalizes Understanding   Class Start Time [phone]   Class Stop Time 0841   Class Time Calculation (min) 33 min    Row Name 11/19/23 0800     Education   Cardiac Education Topics Pritikin   Select Core Videos     Core Videos   Educator Dietitian   Select Nutrition   Nutrition Fueling a Healthy Body   Instruction  Review Code 1- Verbalizes Understanding   Class Start Time 0815   Class Stop Time 0900   Class Time Calculation (min) 45 min    Row Name 11/21/23 0900     Education   Cardiac Education Topics Pritikin   Customer Service Manager   Weekly Topic International Cuisine- Spotlight on the Blue Zones   Instruction Review Code 1- Verbalizes Understanding   Class Start Time 0815   Class Stop Time 0850   Class Time Calculation (min) 35 min    Row Name 11/23/23 0900     Education   Cardiac Education Topics Pritikin   Select Core Videos     Core Videos   Educator Exercise Physiologist   Select Exercise Education   Exercise Education Improving Performance   Instruction Review Code 1- Verbalizes Understanding   Class Start Time [phone]   Class Stop Time 0848   Class Time Calculation (min) 37 min    Row Name 11/30/23 0800     Education   Cardiac Education Topics Pritikin   Select Core Videos     Core Videos   Educator Exercise Physiologist   Select Psychosocial   Psychosocial How Our Thoughts Can Heal Our Hearts   Instruction Review Code 1- Verbalizes Understanding   Class Start Time [phone]   Class Stop Time 0848   Class Time Calculation (min) 36 min    Row Name 12/03/23 0800     Education   Cardiac Education Topics Pritikin   Select Workshops     Workshops   Educator Exercise Physiologist   Select Exercise   Exercise Workshop Managing Heart Disease: Your Path to a Healthier Heart   Instruction Review Code 1- Verbalizes Understanding            Core Videos: Exercise    Move It!  Clinical staff conducted group or individual video education with verbal and written material and guidebook.  Patient learns the recommended Pritikin exercise program. Exercise with the goal of living a long, healthy life. Some of the health benefits of exercise include controlled diabetes, healthier blood pressure levels, improved cholesterol levels,  improved heart and lung capacity, improved sleep, and better body composition. Everyone should speak with their doctor before starting or changing an exercise routine.  Biomechanical Limitations Clinical staff conducted group or individual video education with verbal and written material and guidebook.  Patient learns how biomechanical limitations can impact exercise and how we can mitigate and possibly overcome limitations to have an impactful and balanced exercise routine.  Body Composition Clinical staff conducted group or individual video education with verbal and written material and guidebook.  Patient learns that body composition (ratio of muscle mass to fat mass) is a key component to assessing overall fitness, rather than body weight alone. Increased fat mass, especially visceral belly fat, can put us  at increased risk for metabolic syndrome, type 2 diabetes, heart disease, and even death. It is  recommended to combine diet and exercise (cardiovascular and resistance training) to improve your body composition. Seek guidance from your physician and exercise physiologist before implementing an exercise routine.  Exercise Action Plan Clinical staff conducted group or individual video education with verbal and written material and guidebook.  Patient learns the recommended strategies to achieve and enjoy long-term exercise adherence, including variety, self-motivation, self-efficacy, and positive decision making. Benefits of exercise include fitness, good health, weight management, more energy, better sleep, less stress, and overall well-being.  Medical   Heart Disease Risk Reduction Clinical staff conducted group or individual video education with verbal and written material and guidebook.  Patient learns our heart is our most vital organ as it circulates oxygen, nutrients, white blood cells, and hormones throughout the entire body, and carries waste away. Data supports a plant-based eating  plan like the Pritikin Program for its effectiveness in slowing progression of and reversing heart disease. The video provides a number of recommendations to address heart disease.   Metabolic Syndrome and Belly Fat  Clinical staff conducted group or individual video education with verbal and written material and guidebook.  Patient learns what metabolic syndrome is, how it leads to heart disease, and how one can reverse it and keep it from coming back. You have metabolic syndrome if you have 3 of the following 5 criteria: abdominal obesity, high blood pressure, high triglycerides, low HDL cholesterol, and high blood sugar.  Hypertension and Heart Disease Clinical staff conducted group or individual video education with verbal and written material and guidebook.  Patient learns that high blood pressure, or hypertension, is very common in the [place] . Hypertension is largely due to excessive salt intake, but other important risk factors include being overweight, physical inactivity, drinking too much alcohol, smoking, and not eating enough potassium from fruits and vegetables. High blood pressure is a leading risk factor for heart attack, stroke, congestive heart failure, dementia, kidney failure, and premature death. Long-term effects of excessive salt intake include stiffening of the arteries and thickening of heart muscle and organ damage. Recommendations include ways to reduce hypertension and the risk of heart disease.  Diseases of Our Time - Focusing on Diabetes Clinical staff conducted group or individual video education with verbal and written material and guidebook.  Patient learns why the best way to stop diseases of our time is prevention, through food and other lifestyle changes. Medicine (such as prescription pills and surgeries) is often only a Band-Aid on the problem, not a long-term solution. Most common diseases of our time include obesity, type 2 diabetes, hypertension, heart  disease, and cancer. The Pritikin Program is recommended and has been proven to help reduce, reverse, and/or prevent the damaging effects of metabolic syndrome.  Nutrition   Overview of the Pritikin Eating Plan  Clinical staff conducted group or individual video education with verbal and written material and guidebook.  Patient learns about the Pritikin Eating Plan for disease risk reduction. The Pritikin Eating Plan emphasizes a wide variety of unrefined, minimally-processed carbohydrates, like fruits, vegetables, whole grains, and legumes. Go, Caution, and Stop food choices are explained. Plant-based and lean animal proteins are emphasized. Rationale provided for low sodium intake for blood pressure control, low added sugars for blood sugar stabilization, and low added fats and oils for coronary artery disease risk reduction and weight management.  Calorie Density  Clinical staff conducted group or individual video education with verbal and written material and guidebook.  Patient learns about calorie density and how it impacts  the Pritikin Eating Plan. Knowing the characteristics of the food you choose will help you decide whether those foods will lead to weight gain or weight loss, and whether you want to consume more or less of them. Weight loss is usually a side effect of the Pritikin Eating Plan because of its focus on low calorie-dense foods.  Label Reading  Clinical staff conducted group or individual video education with verbal and written material and guidebook.  Patient learns about the Pritikin recommended label reading guidelines and corresponding recommendations regarding calorie density, added sugars, sodium content, and whole grains.  Dining Out - Part 1  Clinical staff conducted group or individual video education with verbal and written material and guidebook.  Patient learns that restaurant meals can be sabotaging because they can be so high in calories, fat, sodium, and/or  sugar. Patient learns recommended strategies on how to positively address this and avoid unhealthy pitfalls.  Facts on Fats  Clinical staff conducted group or individual video education with verbal and written material and guidebook.  Patient learns that lifestyle modifications can be just as effective, if not more so, as many medications for lowering your risk of heart disease. A Pritikin lifestyle can help to reduce your risk of inflammation and atherosclerosis (cholesterol build-up, or plaque, in the artery walls). Lifestyle interventions such as dietary choices and physical activity address the cause of atherosclerosis. A review of the types of fats and their impact on blood cholesterol levels, along with dietary recommendations to reduce fat intake is also included.  Nutrition Action Plan  Clinical staff conducted group or individual video education with verbal and written material and guidebook.  Patient learns how to incorporate Pritikin recommendations into their lifestyle. Recommendations include planning and keeping personal health goals in mind as an important part of their success.  Healthy Mind-Set    Healthy Minds, Bodies, Hearts  Clinical staff conducted group or individual video education with verbal and written material and guidebook.  Patient learns how to identify when they are stressed. Video will discuss the impact of that stress, as well as the many benefits of stress management. Patient will also be introduced to stress management techniques. The way we think, act, and feel has an impact on our hearts.  How Our Thoughts Can Heal Our Hearts  Clinical staff conducted group or individual video education with verbal and written material and guidebook.  Patient learns that negative thoughts can cause depression and anxiety. This can result in negative lifestyle behavior and serious health problems. Cognitive behavioral therapy is an effective method to help control our thoughts in  order to change and improve our emotional outlook.  Additional Videos:  Exercise    Improving Performance  Clinical staff conducted group or individual video education with verbal and written material and guidebook.  Patient learns to use a non-linear approach by alternating intensity levels and lengths of time spent exercising to help burn more calories and lose more body fat. Cardiovascular exercise helps improve heart health, metabolism, hormonal balance, blood sugar control, and recovery from fatigue. Resistance training improves strength, endurance, balance, coordination, reaction time, metabolism, and muscle mass. Flexibility exercise improves circulation, posture, and balance. Seek guidance from your physician and exercise physiologist before implementing an exercise routine and learn your capabilities and proper form for all exercise.  Introduction to Yoga  Clinical staff conducted group or individual video education with verbal and written material and guidebook.  Patient learns about yoga, a discipline of the coming together of mind,  breath, and body. The benefits of yoga include improved flexibility, improved range of motion, better posture and core strength, increased lung function, weight loss, and positive self-image. Yoga's heart health benefits include lowered blood pressure, healthier heart rate, decreased cholesterol and triglyceride levels, improved immune function, and reduced stress. Seek guidance from your physician and exercise physiologist before implementing an exercise routine and learn your capabilities and proper form for all exercise.  Medical   Aging: Enhancing Your Quality of Life  Clinical staff conducted group or individual video education with verbal and written material and guidebook.  Patient learns key strategies and recommendations to stay in good physical health and enhance quality of life, such as prevention strategies, having an advocate, securing a Health  Care Proxy and Power of Attorney, and keeping a list of medications and system for tracking them. It also discusses how to avoid risk for bone loss.  Biology of Weight Control  Clinical staff conducted group or individual video education with verbal and written material and guidebook.  Patient learns that weight gain occurs because we consume more calories than we burn (eating more, moving less). Even if your body weight is normal, you may have higher ratios of fat compared to muscle mass. Too much body fat puts you at increased risk for cardiovascular disease, heart attack, stroke, type 2 diabetes, and obesity-related cancers. In addition to exercise, following the Pritikin Eating Plan can help reduce your risk.  Decoding Lab Results  Clinical staff conducted group or individual video education with verbal and written material and guidebook.  Patient learns that lab test reflects one measurement whose values change over time and are influenced by many factors, including medication, stress, sleep, exercise, food, hydration, pre-existing medical conditions, and more. It is recommended to use the knowledge from this video to become more involved with your lab results and evaluate your numbers to speak with your doctor.   Diseases of Our Time - Overview  Clinical staff conducted group or individual video education with verbal and written material and guidebook.  Patient learns that according to the CDC, 50% to 70% of chronic diseases (such as obesity, type 2 diabetes, elevated lipids, hypertension, and heart disease) are avoidable through lifestyle improvements including healthier food choices, listening to satiety cues, and increased physical activity.  Sleep Disorders Clinical staff conducted group or individual video education with verbal and written material and guidebook.  Patient learns how good quality and duration of sleep are important to overall health and well-being. Patient also learns  about sleep disorders and how they impact health along with recommendations to address them, including discussing with a physician.  Nutrition  Dining Out - Part 2 Clinical staff conducted group or individual video education with verbal and written material and guidebook.  Patient learns how to plan ahead and communicate in order to maximize their dining experience in a healthy and nutritious manner. Included are recommended food choices based on the type of restaurant the patient is visiting.   Fueling a Banker conducted group or individual video education with verbal and written material and guidebook.  There is a strong connection between our food choices and our health. Diseases like obesity and type 2 diabetes are very prevalent and are in large-part due to lifestyle choices. The Pritikin Eating Plan provides plenty of food and hunger-curbing satisfaction. It is easy to follow, affordable, and helps reduce health risks.  Menu Workshop  Clinical staff conducted group or individual video education with verbal  and written material and guidebook.  Patient learns that restaurant meals can sabotage health goals because they are often packed with calories, fat, sodium, and sugar. Recommendations include strategies to plan ahead and to communicate with the manager, chef, or server to help order a healthier meal.  Planning Your Eating Strategy  Clinical staff conducted group or individual video education with verbal and written material and guidebook.  Patient learns about the Pritikin Eating Plan and its benefit of reducing the risk of disease. The Pritikin Eating Plan does not focus on calories. Instead, it emphasizes high-quality, nutrient-rich foods. By knowing the characteristics of the foods, we choose, we can determine their calorie density and make informed decisions.  Targeting Your Nutrition Priorities  Clinical staff conducted group or individual video education with  verbal and written material and guidebook.  Patient learns that lifestyle habits have a tremendous impact on disease risk and progression. This video provides eating and physical activity recommendations based on your personal health goals, such as reducing LDL cholesterol, losing weight, preventing or controlling type 2 diabetes, and reducing high blood pressure.  Vitamins and Minerals  Clinical staff conducted group or individual video education with verbal and written material and guidebook.  Patient learns different ways to obtain key vitamins and minerals, including through a recommended healthy diet. It is important to discuss all supplements you take with your doctor.   Healthy Mind-Set    Smoking Cessation  Clinical staff conducted group or individual video education with verbal and written material and guidebook.  Patient learns that cigarette smoking and tobacco addiction pose a serious health risk which affects millions of people. Stopping smoking will significantly reduce the risk of heart disease, lung disease, and many forms of cancer. Recommended strategies for quitting are covered, including working with your doctor to develop a successful plan.  Culinary   Becoming a Set Designer conducted group or individual video education with verbal and written material and guidebook.  Patient learns that cooking at home can be healthy, cost-effective, quick, and puts them in control. Keys to cooking healthy recipes will include looking at your recipe, assessing your equipment needs, planning ahead, making it simple, choosing cost-effective seasonal ingredients, and limiting the use of added fats, salts, and sugars.  Cooking - Breakfast and Snacks  Clinical staff conducted group or individual video education with verbal and written material and guidebook.  Patient learns how important breakfast is to satiety and nutrition through the entire day. Recommendations include key  foods to eat during breakfast to help stabilize blood sugar levels and to prevent overeating at meals later in the day. Planning ahead is also a key component.  Cooking - Educational Psychologist conducted group or individual video education with verbal and written material and guidebook.  Patient learns eating strategies to improve overall health, including an approach to cook more at home. Recommendations include thinking of animal protein as a side on your plate rather than center stage and focusing instead on lower calorie dense options like vegetables, fruits, whole grains, and plant-based proteins, such as beans. Making sauces in large quantities to freeze for later and leaving the skin on your vegetables are also recommended to maximize your experience.  Cooking - Healthy Salads and Dressing Clinical staff conducted group or individual video education with verbal and written material and guidebook.  Patient learns that vegetables, fruits, whole grains, and legumes are the foundations of the Pritikin Eating Plan. Recommendations include how to incorporate  each of these in flavorful and healthy salads, and how to create homemade salad dressings. Proper handling of ingredients is also covered. Cooking - Soups and [employer] - Soups and Desserts Clinical staff conducted group or individual video education with verbal and written material and guidebook.  Patient learns that Pritikin soups and desserts make for easy, nutritious, and delicious snacks and meal components that are low in sodium, fat, sugar, and calorie density, while high in vitamins, minerals, and filling fiber. Recommendations include simple and healthy ideas for soups and desserts.   Overview     The Pritikin Solution Program Overview Clinical staff conducted group or individual video education with verbal and written material and guidebook.  Patient learns that the results of the Pritikin Program have been  documented in more than 100 articles published in peer-reviewed journals, and the benefits include reducing risk factors for (and, in some cases, even reversing) high cholesterol, high blood pressure, type 2 diabetes, obesity, and more! An overview of the three key pillars of the Pritikin Program will be covered: eating well, doing regular exercise, and having a healthy mind-set.  WORKSHOPS  Exercise: Exercise Basics: Building Your Action Plan Clinical staff led group instruction and group discussion with PowerPoint presentation and patient guidebook. To enhance the learning environment the use of posters, models and videos may be added. At the conclusion of this workshop, patients will comprehend the difference between physical activity and exercise, as well as the benefits of incorporating both, into their routine. Patients will understand the FITT (Frequency, Intensity, Time, and Type) principle and how to use it to build an exercise action plan. In addition, safety concerns and other considerations for exercise and cardiac rehab will be addressed by the presenter. The purpose of this lesson is to promote a comprehensive and effective weekly exercise routine in order to improve patients' overall level of fitness.   Managing Heart Disease: Your Path to a Healthier Heart Clinical staff led group instruction and group discussion with PowerPoint presentation and patient guidebook. To enhance the learning environment the use of posters, models and videos may be added.At the conclusion of this workshop, patients will understand the anatomy and physiology of the heart. Additionally, they will understand how Pritikin's three pillars impact the risk factors, the progression, and the management of heart disease.  The purpose of this lesson is to provide a high-level overview of the heart, heart disease, and how the Pritikin lifestyle positively impacts risk factors.  Exercise Biomechanics Clinical  staff led group instruction and group discussion with PowerPoint presentation and patient guidebook. To enhance the learning environment the use of posters, models and videos may be added. Patients will learn how the structural parts of their bodies function and how these functions impact their daily activities, movement, and exercise. Patients will learn how to promote a neutral spine, learn how to manage pain, and identify ways to improve their physical movement in order to promote healthy living. The purpose of this lesson is to expose patients to common physical limitations that impact physical activity. Participants will learn practical ways to adapt and manage aches and pains, and to minimize their effect on regular exercise. Patients will learn how to maintain good posture while sitting, walking, and lifting.  Balance Training and Fall Prevention  Clinical staff led group instruction and group discussion with PowerPoint presentation and patient guidebook. To enhance the learning environment the use of posters, models and videos may be added. At the conclusion of this workshop, patients  will understand the importance of their sensorimotor skills (vision, proprioception, and the vestibular system) in maintaining their ability to balance as they age. Patients will apply a variety of balancing exercises that are appropriate for their current level of function. Patients will understand the common causes for poor balance, possible solutions to these problems, and ways to modify their physical environment in order to minimize their fall risk. The purpose of this lesson is to teach patients about the importance of maintaining balance as they age and ways to minimize their risk of falling.  WORKSHOPS   Nutrition:  Fueling a Ship Broker led group instruction and group discussion with PowerPoint presentation and patient guidebook. To enhance the learning environment the use of  posters, models and videos may be added. Patients will review the foundational principles of the Pritikin Eating Plan and understand what constitutes a serving size in each of the food groups. Patients will also learn Pritikin-friendly foods that are better choices when away from home and review make-ahead meal and snack options. Calorie density will be reviewed and applied to three nutrition priorities: weight maintenance, weight loss, and weight gain. The purpose of this lesson is to reinforce (in a group setting) the key concepts around what patients are recommended to eat and how to apply these guidelines when away from home by planning and selecting Pritikin-friendly options. Patients will understand how calorie density may be adjusted for different weight management goals.  Mindful Eating  Clinical staff led group instruction and group discussion with PowerPoint presentation and patient guidebook. To enhance the learning environment the use of posters, models and videos may be added. Patients will briefly review the concepts of the Pritikin Eating Plan and the importance of low-calorie dense foods. The concept of mindful eating will be introduced as well as the importance of paying attention to internal hunger signals. Triggers for non-hunger eating and techniques for dealing with triggers will be explored. The purpose of this lesson is to provide patients with the opportunity to review the basic principles of the Pritikin Eating Plan, discuss the value of eating mindfully and how to measure internal cues of hunger and fullness using the Hunger Scale. Patients will also discuss reasons for non-hunger eating and learn strategies to use for controlling emotional eating.  Targeting Your Nutrition Priorities Clinical staff led group instruction and group discussion with PowerPoint presentation and patient guidebook. To enhance the learning environment the use of posters, models and videos may be added.  Patients will learn how to determine their genetic susceptibility to disease by reviewing their family history. Patients will gain insight into the importance of diet as part of an overall healthy lifestyle in mitigating the impact of genetics and other environmental insults. The purpose of this lesson is to provide patients with the opportunity to assess their personal nutrition priorities by looking at their family history, their own health history and current risk factors. Patients will also be able to discuss ways of prioritizing and modifying the Pritikin Eating Plan for their highest risk areas  Menu  Clinical staff led group instruction and group discussion with PowerPoint presentation and patient guidebook. To enhance the learning environment the use of posters, models and videos may be added. Using menus brought in from [employer], or printed from [employer], patients will apply the Pritikin dining out guidelines that were presented in the [employer] video. Patients will also be able to practice these guidelines in a variety of provided scenarios. The  purpose of this lesson is to provide patients with the opportunity to practice hands-on learning of the Pritikin Dining Out guidelines with actual menus and practice scenarios.  Label Reading Clinical staff led group instruction and group discussion with PowerPoint presentation and patient guidebook. To enhance the learning environment the use of posters, models and videos may be added. Patients will review and discuss the Pritikin label reading guidelines presented in Pritikin's Label Reading Educational series video. Using fool labels brought in from local grocery stores and markets, patients will apply the label reading guidelines and determine if the packaged food meet the Pritikin guidelines. The purpose of this lesson is to provide patients with the opportunity to review, discuss, and practice hands-on learning of the  Pritikin Label Reading guidelines with actual packaged food labels. Cooking School  Pritikin's [employer] are designed to teach patients ways to prepare quick, simple, and affordable recipes at home. The importance of nutrition's role in chronic disease risk reduction is reflected in its emphasis in the overall Pritikin program. By learning how to prepare essential core Pritikin Eating Plan recipes, patients will increase control over what they eat; be able to customize the flavor of foods without the use of added salt, sugar, or fat; and improve the quality of the food they consume. By learning a set of core recipes which are easily assembled, quickly prepared, and affordable, patients are more likely to prepare more healthy foods at home. These workshops focus on convenient breakfasts, simple entres, side dishes, and desserts which can be prepared with minimal effort and are consistent with nutrition recommendations for cardiovascular risk reduction. Cooking [employer] are taught by a armed forces logistics/support/administrative officer (RD) who has been trained by the [employer]. The chef or RD has a clear understanding of the importance of minimizing - if not completely eliminating - added fat, sugar, and sodium in recipes. Throughout the series of Cooking School Workshop sessions, patients will learn about healthy ingredients and efficient methods of cooking to build confidence in their capability to prepare    Cooking School weekly topics:  Adding Flavor- Sodium-Free  Fast and Healthy Breakfasts  Powerhouse Plant-Based Proteins  Satisfying Salads and Dressings  Simple Sides and Sauces  International Cuisine-Spotlight on the [employer] Zones  Delicious Desserts  Savory Soups  [employer] - Meals in a Astronomer Appetizers and Snacks  Comforting Weekend Breakfasts  One-Pot Wonders   Fast Evening Meals  Landscape Architect Your Pritikin Plate  WORKSHOPS    Healthy Mindset (Psychosocial):  Focused Goals, Sustainable Changes Clinical staff led group instruction and group discussion with PowerPoint presentation and patient guidebook. To enhance the learning environment the use of posters, models and videos may be added. Patients will be able to apply effective goal setting strategies to establish at least one personal goal, and then take consistent, meaningful action toward that goal. They will learn to identify common barriers to achieving personal goals and develop strategies to overcome them. Patients will also gain an understanding of how our mind-set can impact our ability to achieve goals and the importance of cultivating a positive and growth-oriented mind-set. The purpose of this lesson is to provide patients with a deeper understanding of how to set and achieve personal goals, as well as the tools and strategies needed to overcome common obstacles which may arise along the way.  From Head to Heart: The Power of a Healthy Outlook  Clinical staff led group instruction and group discussion  with PowerPoint presentation and patient guidebook. To enhance the learning environment the use of posters, models and videos may be added. Patients will be able to recognize and describe the impact of emotions and mood on physical health. They will discover the importance of self-care and explore self-care practices which may work for them. Patients will also learn how to utilize the 4 C's to cultivate a healthier outlook and better manage stress and challenges. The purpose of this lesson is to demonstrate to patients how a healthy outlook is an essential part of maintaining good health, especially as they continue their cardiac rehab journey.  Healthy Sleep for a Healthy Heart Clinical staff led group instruction and group discussion with PowerPoint presentation and patient guidebook. To enhance the learning environment the use of posters, models and videos may be  added. At the conclusion of this workshop, patients will be able to demonstrate knowledge of the importance of sleep to overall health, well-being, and quality of life. They will understand the symptoms of, and treatments for, common sleep disorders. Patients will also be able to identify daytime and nighttime behaviors which impact sleep, and they will be able to apply these tools to help manage sleep-related challenges. The purpose of this lesson is to provide patients with a general overview of sleep and outline the importance of quality sleep. Patients will learn about a few of the most common sleep disorders. Patients will also be introduced to the concept of "sleep hygiene," and discover ways to self-manage certain sleeping problems through simple daily behavior changes. Finally, the workshop will motivate patients by clarifying the links between quality sleep and their goals of heart-healthy living.   Recognizing and Reducing Stress Clinical staff led group instruction and group discussion with PowerPoint presentation and patient guidebook. To enhance the learning environment the use of posters, models and videos may be added. At the conclusion of this workshop, patients will be able to understand the types of stress reactions, differentiate between acute and chronic stress, and recognize the impact that chronic stress has on their health. They will also be able to apply different coping mechanisms, such as reframing negative self-talk. Patients will have the opportunity to practice a variety of stress management techniques, such as deep abdominal breathing, progressive muscle relaxation, and/or guided imagery.  The purpose of this lesson is to educate patients on the role of stress in their lives and to provide healthy techniques for coping with it.  Learning Barriers/Preferences:  Learning Barriers/Preferences - 09/18/23 1018       Learning Barriers/Preferences   Learning Barriers Sight;Hearing    wears glasses and bilateral hearing aids   Learning Preferences Skilled Demonstration;Individual Instruction;Group Instruction             Education Topics:  Knowledge Questionnaire Score:  Knowledge Questionnaire Score - 09/18/23 1019       Knowledge Questionnaire Score   Pre Score 23/24             Core Components/Risk Factors/Patient Goals at Admission:  Personal Goals and Risk Factors at Admission - 09/18/23 1022       Core Components/Risk Factors/Patient Goals on Admission    Weight Management Yes;Weight Loss    Intervention Weight Management: Develop a combined nutrition and exercise program designed to reach desired caloric intake, while maintaining appropriate intake of nutrient and fiber, sodium and fats, and appropriate energy expenditure required for the weight goal.;Weight Management: Provide education and appropriate resources to help participant work on and attain dietary goals.;Weight  Management/Obesity: Establish reasonable short term and long term weight goals.    Expected Outcomes Short Term: Continue to assess and modify interventions until short term weight is achieved;Long Term: Adherence to nutrition and physical activity/exercise program aimed toward attainment of established weight goal;Weight Loss: Understanding of general recommendations for a balanced deficit meal plan, which promotes 1-2 lb weight loss per week and includes a negative energy balance of [phone] kcal/d;Understanding recommendations for meals to include 15-35% energy as protein, 25-35% energy from fat, 35-60% energy from carbohydrates, less than 200mg  of dietary cholesterol, 20-35 gm of total fiber daily;Understanding of distribution of calorie intake throughout the day with the consumption of 4-5 meals/snacks    Hypertension Yes    Intervention Provide education on lifestyle modifcations including regular physical activity/exercise, weight management, moderate sodium restriction and  increased consumption of fresh fruit, vegetables, and low fat dairy, alcohol moderation, and smoking cessation.;Monitor prescription use compliance.    Expected Outcomes Short Term: Continued assessment and intervention until BP is < 140/78mm HG in hypertensive participants. < 130/60mm HG in hypertensive participants with diabetes, heart failure or chronic kidney disease.;Long Term: Maintenance of blood pressure at goal levels.    Lipids Yes    Intervention Provide education and support for participant on nutrition & aerobic/resistive exercise along with prescribed medications to achieve LDL 70mg , HDL >40mg .    Expected Outcomes Short Term: Participant states understanding of desired cholesterol values and is compliant with medications prescribed. Participant is following exercise prescription and nutrition guidelines.;Long Term: Cholesterol controlled with medications as prescribed, with individualized exercise RX and with personalized nutrition plan. Value goals: LDL < 70mg , HDL > 40 mg.    Stress Yes    Intervention Offer individual and/or small group education and counseling on adjustment to heart disease, stress management and health-related lifestyle change. Teach and support self-help strategies.    Expected Outcomes Short Term: Participant demonstrates changes in health-related behavior, relaxation and other stress management skills, ability to obtain effective social support, and compliance with psychotropic medications if prescribed.;Long Term: Emotional wellbeing is indicated by absence of clinically significant psychosocial distress or social isolation.             Core Components/Risk Factors/Patient Goals Review:   Goals and Risk Factor Review     Row Name 09/25/23 1410 10/16/23 1041 11/12/23 1103 11/29/23 1431       Core Components/Risk Factors/Patient Goals Review   Personal Goals Review Weight Management/Obesity;Lipids;Hypertension;Stress Weight  Management/Obesity;Lipids;Hypertension;Stress Weight Management/Obesity;Lipids;Hypertension;Stress Weight Management/Obesity;Lipids;Hypertension;Stress    Review Charles Ellison started cardiac rehab on 09/24/23. Charles Ellison did well with exercise. Vital signs were stable. Charles Ellison started cardiac rehab on 09/24/23. Charles Ellison continues to do  well with exercise. Vital signs have been stable. Charles Ellison has increased his met levels. Charles Ellison continues to do  well with exercise. Vital signs have been stable. Charles Ellison has increased his met levels. Charles Ellison has lost 2.5 kg since starting cardiac  rehab Charles Ellison continues to do  well with exercise. Vital signs have been stable. Charles Ellison has increased his met levels. Charles Ellison has lost 2.6 kg since starting cardiac  rehab    Expected Outcomes Charles Ellison will continue to participate in cardiac rehab for exercise, nutrition and lifestyle modifications Charles Ellison will continue to participate in cardiac rehab for exercise, nutrition and lifestyle modifications Charles Ellison will continue to participate in cardiac rehab for exercise, nutrition and lifestyle modifications Charles Ellison will continue to participate in cardiac rehab for exercise, nutrition and lifestyle modifications  Core Components/Risk Factors/Patient Goals at Discharge (Final Review):   Goals and Risk Factor Review - 11/29/23 1431       Core Components/Risk Factors/Patient Goals Review   Personal Goals Review Weight Management/Obesity;Lipids;Hypertension;Stress    Review Charles Ellison continues to do  well with exercise. Vital signs have been stable. Charles Ellison has increased his met levels. Charles Ellison has lost 2.6 kg since starting cardiac  rehab    Expected Outcomes Charles Ellison will continue to participate in cardiac rehab for exercise, nutrition and lifestyle modifications             ITP Comments:  ITP Comments     Row Name 09/18/23 0929 09/25/23 1357 10/16/23 1026 11/12/23 1102 11/29/23 1430   ITP Comments Charles Bihari, MD: Medical Director.   Introduction to the [facility]/Intensive Cardiac Rehab.  Initial orientation packet reviewed with the patient today. 30 Day ITP Review. Charles Ellison started cardiac rehab on 09/24/23. Charles Ellison did well with exercise. 30 Day ITP Review. Charles Ellison has good attendance and participation with exercise at cardiac rehab 30 Day ITP Review. Charles Ellison continues to have  good attendance and participation with exercise at cardiac rehab 30 Day ITP Review. Charles Ellison continues to have  good attendance and participation with exercise at cardiac rehab            Comments: See ITP comments.

## 2023-12-05 ENCOUNTER — Encounter (HOSPITAL_COMMUNITY)
Admission: RE | Admit: 2023-12-05 | Discharge: 2023-12-05 | Disposition: A | Discharge status: Home / Self Care | Payer: PPO | Source: Ambulatory Visit | Attending: Cardiology

## 2023-12-05 VITALS — Ht 70.5 in | Wt 196.0 lb

## 2023-12-05 DIAGNOSIS — I214 Non-ST elevation (NSTEMI) myocardial infarction: Secondary | ICD-10-CM

## 2023-12-05 DIAGNOSIS — Z955 Presence of coronary angioplasty implant and graft: Secondary | ICD-10-CM

## 2023-12-07 ENCOUNTER — Encounter (HOSPITAL_COMMUNITY)
Admission: RE | Admit: 2023-12-07 | Discharge: 2023-12-07 | Disposition: A | Discharge status: Home / Self Care | Payer: PPO | Source: Ambulatory Visit | Attending: Cardiology | Admitting: Cardiology

## 2023-12-07 DIAGNOSIS — I214 Non-ST elevation (NSTEMI) myocardial infarction: Secondary | ICD-10-CM | POA: Diagnosis not present

## 2023-12-07 DIAGNOSIS — Z955 Presence of coronary angioplasty implant and graft: Secondary | ICD-10-CM

## 2023-12-09 NOTE — Progress Notes (Unsigned)
 Cardiology Office Note:  .   Date:  12/10/2023  ID:  Charles Ellison., DOB 29-Nov-1950, MRN [id number] PCP: Benedetta Bradley, MD  [facility] Providers Cardiologist:  Knox Perl, MD Electrophysiologist:  Will Cortland Ding, MD   History of Present Illness: Charles Ellison. is a 73 y.o. Male patient with CAD, stenting to mid LAD for NSTEMI on 08/24/2023, sinus node dysfunction SP Medtronic dual-chamber pacemaker implantation in 2018, primary hypertension, brief PAF episodes on pacemaker interrogation with burden <1% hence not on anticoagulation presents for his 4-month office visit.  Discussed the use of AI scribe software for clinical note transcription with the patient, who gave verbal consent to proceed.  History of Present Illness Charles Ellison, a 73 year old patient with a history of coronary artery disease and a pacemaker, presents for a follow-up visit. He had a small heart attack on 10/25, after which he underwent angioplasty and had a stent placed in his LAD. Since the procedure, he reports no angina and his heart function has been normal. He has been attending cardio rehab three days a week and walking four miles a day, with no reported chest pain.  The patient has been experiencing snoring and disturbed sleep at night, which he attributes to sinus blockage. He reports no known allergies. His wife has noted episodes of apnea during his sleep. He also reports episodes of brief atrial fibrillation, as noted on his pacemaker.  The patient has a goal to lose weight, having already lost seven to eight pounds. He aims to lose 10% of his body weight, which he believes will reduce his risk of atrial fibrillation. He also reports a decrease in sexual activity, which he attributes to a loss of momentum.  Labs   Lab Results  Component Value Date   CHOL 124 10/25/2023   HDL 41 10/25/2023   LDLCALC 67 10/25/2023   TRIG 83 10/25/2023   CHOLHDL 3.0 10/25/2023   Lab Results   Component Value Date   NA 144 10/25/2023   K 4.3 10/25/2023   CO2 23 10/25/2023   GLUCOSE 113 (H) 10/25/2023   BUN 15 10/25/2023   CREATININE 1.20 10/25/2023   CALCIUM  10.2 10/25/2023   EGFR 65 10/25/2023   GFRNONAA >60 08/25/2023      Latest Ref Rng & Units 10/25/2023    8:48 AM 08/25/2023    3:18 AM 08/24/2023    2:29 PM  BMP  Glucose 70 - 99 mg/dL 409  811    BUN 8 - 27 mg/dL 15  12    Creatinine 9.14 - 1.27 mg/dL 7.82  9.56  2.13   BUN/Creat Ratio 10 - 24 13     Sodium 134 - 144 mmol/L 144  138    Potassium 3.5 - 5.2 mmol/L 4.3  3.7    Chloride 96 - 106 mmol/L 107  107    CO2 20 - 29 mmol/L 23  23    Calcium  8.6 - 10.2 mg/dL 08.6  8.9        Latest Ref Rng & Units 08/25/2023    3:18 AM 08/24/2023    2:29 PM 08/24/2023    3:00 AM  CBC  WBC 4.0 - 10.5 K/uL 10.2  12.9  9.1   Hemoglobin 13.0 - 17.0 g/dL 57.8  46.9  62.9   Hematocrit 39.0 - 52.0 % 44.0  47.6  45.7   Platelets 150 - 400 K/uL 124  129  135  No results found for: "HGBA1C"  Lab Results  Component Value Date   TSH 1.950 01/28/2020    Review of Systems  Cardiovascular:  Negative for chest pain, dyspnea on exertion and leg swelling.   Physical Exam:   VS:  BP 112/68 (BP Location: Left Arm, Patient Position: Sitting, Cuff Size: Normal)   Pulse 73   Resp 16   Ht 5\' 10"  (1.778 m)   Wt 195 lb 6.4 oz (88.6 kg)   SpO2 98%   BMI 28.04 kg/m    Wt Readings from Last 3 Encounters:  12/10/23 195 lb 6.4 oz (88.6 kg)  12/05/23 195 lb 15.8 oz (88.9 kg)  11/21/23 194 lb (88 kg)    Physical Exam Neck:     Vascular: No carotid bruit or JVD.  Cardiovascular:     Rate and Rhythm: Normal rate and regular rhythm.     Pulses: Intact distal pulses.     Heart sounds: Normal heart sounds. No murmur heard.    No gallop.  Pulmonary:     Effort: Pulmonary effort is normal.     Breath sounds: Normal breath sounds.  Abdominal:     General: Bowel sounds are normal.     Palpations: Abdomen is soft.  Musculoskeletal:      Right lower leg: No edema.     Left lower leg: No edema.    Studies Reviewed: Aaron Aas    CARDIAC CATHETERIZATION 08/24/2023  Mid LAD 2.75 x 32 mm Synergy stent     ECHOCARDIOGRAM COMPLETE 08/25/2023  1. There is apical hypokenesis. . Left ventricular ejection fraction, by estimation, is 55 to 60%. The left ventricle has normal function. The left ventricle demonstrates regional wall motion abnormalities (see scoring diagram/findings for description). Left ventricular diastolic parameters are consistent with Grade I diastolic dysfunction (impaired relaxation). 2. Right ventricular systolic function is mildly reduced. The right ventricular size is moderately enlarged. 3. The mitral valve is grossly normal. Mild mitral valve regurgitation. No evidence of mitral stenosis. 4. The aortic valve is tricuspid. Aortic valve regurgitation is not visualized. No aortic stenosis is present.  EKG:         EKG 08/24/2023: Atrially paced rhythm with first-degree AV block at the rate of 60 bpm, inferior infarct old.  Incomplete right bundle branch block.  Poor R wave progression no evidence of ischemia.  Medications and allergies    No Known Allergies   Current Outpatient Medications:    aspirin  EC 81 MG tablet, Take 1 tablet (81 mg total) by mouth daily. Swallow whole., Disp: , Rfl:    atorvastatin  (LIPITOR) 80 MG tablet, Take 1 tablet (80 mg total) by mouth daily., Disp: 30 tablet, Rfl: 11   metoprolol  succinate (TOPROL -XL) 100 MG 24 hr tablet, TAKE 1 TABLET(100 MG) BY MOUTH DAILY, Disp: 30 tablet, Rfl: 0   PEPCID  COMPLETE 10-800-165 MG chewable tablet, Chew 1 tablet by mouth 2 (two) times daily as needed (for heartburn/indigestion)., Disp: , Rfl:    ticagrelor  (BRILINTA ) 90 MG TABS tablet, Take 1 tablet (90 mg total) by mouth 2 (two) times daily., Disp: 60 tablet, Rfl: 11   nitroGLYCERIN  (NITROSTAT ) 0.4 MG SL tablet, Place 1 tablet (0.4 mg total) under the tongue every 5 (five) minutes as needed for  chest pain., Disp: 25 tablet, Rfl: 3   No orders of the defined types were placed in this encounter.    Medications Discontinued During This Encounter  Medication Reason   Collagen-Vitamin C-Biotin (COLLAGEN PO) Patient Preference  Glucosamine-Chondroit-Vit C-Mn (GLUCOSAMINE-CHONDROITIN) TABS Patient Preference   Omega-3 Fatty Acids (FISH OIL PO) Patient Preference     ASSESSMENT AND PLAN: .      ICD-10-CM   1. Coronary artery disease involving native coronary artery of native heart without angina pectoris  I25.10     2. Pacemaker Dual chamber Medtronic Azure XT DR MRI SureScan [id number] (serial number [id number])   Z95.0     3. Essential hypertension  I10     4. Hyperlipidemia LDL goal <70  E78.5     5. Snoring  R06.83       Assessment and Plan Assessment & Plan Coronary Artery Disease with Stent   He is status post stent placement in the LAD on August 24, 2023, following a small myocardial infarction. He has experienced no angina since the angioplasty, and heart function is normal. He remains asymptomatic and is on dual antiplatelet therapy with aspirin  and Brilinta . Continue both medications until January 2026, at which point Brilinta  will be stopped. Consider either continuing aspirin  or switching to Plavix.  Paroxysmal Atrial Fibrillation   Brief episodes of atrial fibrillation lasting 6-8 seconds have been detected on his pacemaker, indicating a low AFib burden. Discussed the impact of untreated sleep apnea on AFib and the benefits of weight loss. Emphasized that losing 5-10% of body weight can significantly reduce AFib burden, potentially more effective than drug therapy. Encourage weight loss and arrange for a sleep study to evaluate for sleep apnea.  Sleep Apnea (Suspected)   Suspected sleep apnea is based on snoring and disturbed sleep. There is a potential link between sleep apnea and AFib. Confirm diagnosis with a sleep study.  Hypertension   His hypertension is  currently managed with metoprolol  100 mg daily, and blood pressure control appears adequate. Discussed potential benefits of ACE inhibitors or ARBs but will continue the current regimen due to concerns about weight gain.  Hyperlipidemia   He is on atorvastatin  80 mg daily, and his recent lipid profile shows LDL at 67 mg/dL, which is well-controlled. Continue atorvastatin  80 mg daily.   Signed,  Knox Perl, MD, [facility] 12/10/2023, 11:11 AM [facility] [address] [place], [place] [address] Phone: [phone]. Fax:  [phone]

## 2023-12-10 ENCOUNTER — Encounter (HOSPITAL_COMMUNITY)
Admission: RE | Admit: 2023-12-10 | Discharge: 2023-12-10 | Disposition: A | Discharge status: Home / Self Care | Payer: PPO | Source: Ambulatory Visit | Attending: Cardiology | Admitting: Cardiology

## 2023-12-10 ENCOUNTER — Ambulatory Visit: Payer: PPO | Attending: Cardiology | Admitting: Cardiology

## 2023-12-10 ENCOUNTER — Encounter: Payer: Self-pay | Admitting: Cardiology

## 2023-12-10 VITALS — BP 112/68 | HR 73 | Resp 16 | Ht 70.0 in | Wt 195.4 lb

## 2023-12-10 DIAGNOSIS — I10 Essential (primary) hypertension: Secondary | ICD-10-CM | POA: Diagnosis not present

## 2023-12-10 DIAGNOSIS — I214 Non-ST elevation (NSTEMI) myocardial infarction: Secondary | ICD-10-CM

## 2023-12-10 DIAGNOSIS — Z950 Presence of cardiac pacemaker: Secondary | ICD-10-CM | POA: Diagnosis not present

## 2023-12-10 DIAGNOSIS — I2510 Atherosclerotic heart disease of native coronary artery without angina pectoris: Secondary | ICD-10-CM | POA: Diagnosis not present

## 2023-12-10 DIAGNOSIS — R0683 Snoring: Secondary | ICD-10-CM | POA: Diagnosis not present

## 2023-12-10 DIAGNOSIS — E785 Hyperlipidemia, unspecified: Secondary | ICD-10-CM | POA: Diagnosis not present

## 2023-12-10 DIAGNOSIS — Z955 Presence of coronary angioplasty implant and graft: Secondary | ICD-10-CM

## 2023-12-10 NOTE — Patient Instructions (Signed)
 Medication Instructions:  Your physician recommends that you continue on your current medications as directed. Please refer to the Current Medication list given to you today.  *If you need a refill on your cardiac medications before your next appointment, please call your pharmacy*  Lab Work: none If you have labs (blood work) drawn today and your tests are completely normal, you will receive your results only by: MyChart Message (if you have MyChart) OR A paper copy in the mail If you have any lab test that is abnormal or we need to change your treatment, we will call you to review the results.  Testing/Procedures: none  Follow-Up: At [facility], you and your health needs are our priority.  As part of our continuing mission to provide you with exceptional heart care, our providers are all part of one team.  This team includes your primary Cardiologist (physician) and Advanced Practice Providers or APPs (Physician Assistants and Nurse Practitioners) who all work together to provide you with the care you need, when you need it.  Your next appointment:   12 month(s)  Provider:   Knox Perl, MD    We recommend signing up for the patient portal called "MyChart".  Sign up information is provided on this After Visit Summary.  MyChart is used to connect with patients for Virtual Visits (Telemedicine).  Patients are able to view lab/test results, encounter notes, upcoming appointments, etc.  Non-urgent messages can be sent to your provider as well.   To learn more about what you can do with MyChart, go to [email or web address].   Other Instructions

## 2023-12-12 ENCOUNTER — Encounter (HOSPITAL_COMMUNITY)
Admission: RE | Admit: 2023-12-12 | Discharge: 2023-12-12 | Disposition: A | Discharge status: Home / Self Care | Payer: PPO | Source: Ambulatory Visit | Attending: Cardiology

## 2023-12-12 DIAGNOSIS — Z955 Presence of coronary angioplasty implant and graft: Secondary | ICD-10-CM

## 2023-12-12 DIAGNOSIS — I214 Non-ST elevation (NSTEMI) myocardial infarction: Secondary | ICD-10-CM | POA: Diagnosis not present

## 2023-12-12 NOTE — Progress Notes (Signed)
 Discharge Progress Report  Patient Details  Name: Charles Ellison. MRN: [id number] Date of Birth: [birth year] Referring Provider:   Flowsheet Row INTENSIVE CARDIAC REHAB ORIENT from 09/18/2023 in [facility]  Referring Provider Knox Perl, MD        Number of Visits: 64  Reason for Discharge:  Patient reached a stable level of exercise. Patient independent in their exercise. Patient has met program and personal goals.  Smoking History:  Social History   Tobacco Use  Smoking Status Never  Smokeless Tobacco Never    Diagnosis:  08/24/23 NSTEMI (non-ST elevated myocardial infarction) (HCC)  08/24/23 Status post coronary artery stent placement  ADL UCSD:   Initial Exercise Prescription:  Initial Exercise Prescription - 09/18/23 1000       Date of Initial Exercise RX and Referring Provider   Date 09/18/23    Referring Provider Knox Perl, MD    Expected Discharge Date 12/12/23      Bike   Level 2    Watts 35    Minutes 15    METs 3.9      Recumbant Elliptical   Level 2    RPM 60    Watts 35    Minutes 15    METs 3.9      Prescription Details   Frequency (times per week) 3    Duration Progress to 30 minutes of continuous aerobic without signs/symptoms of physical distress      Intensity   THRR 40-80% of Max Heartrate 60-120    Ratings of Perceived Exertion 11-13    Perceived Dyspnea 0-4      Progression   Progression Continue progressive overload as per policy without signs/symptoms or physical distress.      Resistance Training   Training Prescription Yes    Weight 4 lbs    Reps 10-15             Discharge Exercise Prescription (Final Exercise Prescription Changes):  Exercise Prescription Changes - 12/12/23 1246       Response to Exercise   Blood Pressure (Admit) 136/64    Blood Pressure (Exit) 120/70    Heart Rate (Admit) 57 bpm    Heart Rate (Exercise) 89 bpm    Heart Rate  (Exit) 66 bpm    Rating of Perceived Exertion (Exercise) 13    Symptoms None    Comments Pt graduated from the CRP2 program today    Duration Continue with 30 min of aerobic exercise without signs/symptoms of physical distress.    Intensity THRR unchanged      Progression   Progression Continue to progress workloads to maintain intensity without signs/symptoms of physical distress.    Average METs 6.45      Resistance Training   Training Prescription No    Weight No weights on wednesdays      Interval Training   Interval Training No      Recumbant Bike   Level 4    RPM 79    Watts 102    Minutes 15    METs 6      Recumbant Elliptical   Level 6    RPM 89    Watts 135    Minutes 15    METs 6.9      Home Exercise Plan   Plans to continue exercise at Home (comment)    Frequency Add 2 additional days to program exercise sessions.    Initial  Home Exercises Provided 10/17/23             Functional Capacity:  6 Minute Walk     Row Name 09/18/23 0905 12/05/23 0918       6 Minute Walk   Phase Initial Discharge    Distance 1836 feet 2375 feet    Distance % Change -- 29.36 %    Distance Feet Change -- 539 ft    Walk Time 6 minutes 6 minutes    # of Rest Breaks 0 0    MPH 3.5 4.5    METS 3.9 4.9    RPE 13 10    Perceived Dyspnea  0 0    VO2 Peak 13.65 17.2    Symptoms No No    Resting HR 63 bpm 70 bpm    Resting BP 118/70 120/74    Resting Oxygen Saturation  96 % --    Exercise Oxygen Saturation  during 6 min walk 99 % --    Max Ex. HR 103 bpm 105 bpm    Max Ex. BP 136/80 136/78    2 Minute Post BP 120/80 122/78             Psychological, QOL, Others - Outcomes: PHQ 2/9:    12/12/2023    9:17 AM 09/18/2023    9:46 AM 01/13/2015    7:10 PM  Depression screen PHQ 2/9  Decreased Interest 0 1 0  Down, Depressed, Hopeless 0 1 0  PHQ - 2 Score 0 2 0  Altered sleeping 0 2   Tired, decreased energy 0 0   Change in appetite 0 1   Feeling bad or failure  about yourself  0 1   Trouble concentrating 0 0   Moving slowly or fidgety/restless 0 0   Suicidal thoughts 0 0   PHQ-9 Score 0 6   Difficult doing work/chores Not difficult at all Somewhat difficult     Quality of Life:  Quality of Life - 12/11/23 1517       Quality of Life Scores   Health/Function Pre 25.5 %    Health/Function Post 27.73 %    Health/Function % Change 8.75 %    Socioeconomic Pre 26.21 %    Socioeconomic Post 29.58 %    Socioeconomic % Change  12.86 %    Psych/Spiritual Pre 19.57 %    Psych/Spiritual Post 26.71 %    Psych/Spiritual % Change 36.48 %    Family Pre 26.4 %    Family Post 26.4 %    Family % Change 0 %    GLOBAL Pre 24.56 %    GLOBAL Post 27.65 %    GLOBAL % Change 12.58 %             Personal Goals: Goals established at orientation with interventions provided to work toward goal.  Personal Goals and Risk Factors at Admission - 09/18/23 1022       Core Components/Risk Factors/Patient Goals on Admission    Weight Management Yes;Weight Loss    Intervention Weight Management: Develop a combined nutrition and exercise program designed to reach desired caloric intake, while maintaining appropriate intake of nutrient and fiber, sodium and fats, and appropriate energy expenditure required for the weight goal.;Weight Management: Provide education and appropriate resources to help participant work on and attain dietary goals.;Weight Management/Obesity: Establish reasonable short term and long term weight goals.    Expected Outcomes Short Term: Continue to assess and modify interventions until short term weight  is achieved;Long Term: Adherence to nutrition and physical activity/exercise program aimed toward attainment of established weight goal;Weight Loss: Understanding of general recommendations for a balanced deficit meal plan, which promotes 1-2 lb weight loss per week and includes a negative energy balance of [phone] kcal/d;Understanding  recommendations for meals to include 15-35% energy as protein, 25-35% energy from fat, 35-60% energy from carbohydrates, less than 200mg  of dietary cholesterol, 20-35 gm of total fiber daily;Understanding of distribution of calorie intake throughout the day with the consumption of 4-5 meals/snacks    Hypertension Yes    Intervention Provide education on lifestyle modifcations including regular physical activity/exercise, weight management, moderate sodium restriction and increased consumption of fresh fruit, vegetables, and low fat dairy, alcohol moderation, and smoking cessation.;Monitor prescription use compliance.    Expected Outcomes Short Term: Continued assessment and intervention until BP is < 140/68mm HG in hypertensive participants. < 130/56mm HG in hypertensive participants with diabetes, heart failure or chronic kidney disease.;Long Term: Maintenance of blood pressure at goal levels.    Lipids Yes    Intervention Provide education and support for participant on nutrition & aerobic/resistive exercise along with prescribed medications to achieve LDL 70mg , HDL >40mg .    Expected Outcomes Short Term: Participant states understanding of desired cholesterol values and is compliant with medications prescribed. Participant is following exercise prescription and nutrition guidelines.;Long Term: Cholesterol controlled with medications as prescribed, with individualized exercise RX and with personalized nutrition plan. Value goals: LDL < 70mg , HDL > 40 mg.    Stress Yes    Intervention Offer individual and/or small group education and counseling on adjustment to heart disease, stress management and health-related lifestyle change. Teach and support self-help strategies.    Expected Outcomes Short Term: Participant demonstrates changes in health-related behavior, relaxation and other stress management skills, ability to obtain effective social support, and compliance with psychotropic medications if  prescribed.;Long Term: Emotional wellbeing is indicated by absence of clinically significant psychosocial distress or social isolation.              Personal Goals Discharge:  Goals and Risk Factor Review     Row Name 09/25/23 1410 10/16/23 1041 11/12/23 1103 11/29/23 1431 12/20/23 1108     Core Components/Risk Factors/Patient Goals Review   Personal Goals Review Weight Management/Obesity;Lipids;Hypertension;Stress Weight Management/Obesity;Lipids;Hypertension;Stress Weight Management/Obesity;Lipids;Hypertension;Stress Weight Management/Obesity;Lipids;Hypertension;Stress Weight Management/Obesity;Lipids;Hypertension;Stress   Review Charles Ellison started cardiac rehab on 09/24/23. Charles Ellison did well with exercise. Vital signs were stable. Charles Ellison started cardiac rehab on 09/24/23. Charles Ellison continues to do  well with exercise. Vital signs have been stable. Charles Ellison has increased his met levels. Charles Ellison continues to do  well with exercise. Vital signs have been stable. Charles Ellison has increased his met levels. Charles Ellison has lost 2.5 kg since starting cardiac  rehab Charles Ellison continues to do  well with exercise. Vital signs have been stable. Charles Ellison has increased his met levels. Charles Ellison has lost 2.6 kg since starting cardiac  rehab Charles Ellison did well with exercise. Vital signs were  stable. Charles Ellison  increased his met levels. Charles Ellison  lost 2.3 kg since starting cardiac  rehab. Charles Ellison completed cardiac rehab on 04.30/25   Expected Outcomes Charles Ellison will continue to participate in cardiac rehab for exercise, nutrition and lifestyle modifications Charles Ellison will continue to participate in cardiac rehab for exercise, nutrition and lifestyle modifications Charles Ellison will continue to participate in cardiac rehab for exercise, nutrition and lifestyle modifications Charles Ellison will continue to participate in cardiac rehab for exercise, nutrition and lifestyle modifications Charles Ellison will continue to participate in cardiac rehab  for exercise, nutrition and  lifestyle modifications            Exercise Goals and Review:  Exercise Goals     Row Name 09/18/23 0943             Exercise Goals   Increase Physical Activity Yes       Intervention Provide advice, education, support and counseling about physical activity/exercise needs.;Develop an individualized exercise prescription for aerobic and resistive training based on initial evaluation findings, risk stratification, comorbidities and participant's personal goals.       Expected Outcomes Short Term: Attend rehab on a regular basis to increase amount of physical activity.;Long Term: Exercising regularly at least 3-5 days a week.;Long Term: Add in home exercise to make exercise part of routine and to increase amount of physical activity.       Increase Strength and Stamina Yes       Intervention Develop an individualized exercise prescription for aerobic and resistive training based on initial evaluation findings, risk stratification, comorbidities and participant's personal goals.;Provide advice, education, support and counseling about physical activity/exercise needs.       Expected Outcomes Short Term: Increase workloads from initial exercise prescription for resistance, speed, and METs.;Short Term: Perform resistance training exercises routinely during rehab and add in resistance training at home;Long Term: Improve cardiorespiratory fitness, muscular endurance and strength as measured by increased METs and functional capacity ( )       Able to understand and use rate of perceived exertion (RPE) scale Yes       Intervention Provide education and explanation on how to use RPE scale       Expected Outcomes Short Term: Able to use RPE daily in rehab to express subjective intensity level;Long Term:  Able to use RPE to guide intensity level when exercising independently       Knowledge and understanding of Target Heart Rate Range (THRR) Yes       Intervention Provide education and explanation  of THRR including how the numbers were predicted and where they are located for reference       Expected Outcomes Short Term: Able to state/look up THRR;Long Term: Able to use THRR to govern intensity when exercising independently;Short Term: Able to use daily as guideline for intensity in rehab       Understanding of Exercise Prescription Yes       Intervention Provide education, explanation, and written materials on patient's individual exercise prescription       Expected Outcomes Short Term: Able to explain program exercise prescription;Long Term: Able to explain home exercise prescription to exercise independently                Exercise Goals Re-Evaluation:  Exercise Goals Re-Evaluation     Row Name 09/24/23 1106 10/26/23 1440 11/23/23 1625 12/12/23 1249       Exercise Goal Re-Evaluation   Exercise Goals Review Increase Physical Activity;Increase Strength and Stamina;Able to understand and use rate of perceived exertion (RPE) scale;Knowledge and understanding of Target Heart Rate Range (THRR);Understanding of Exercise Prescription Increase Physical Activity;Increase Strength and Stamina;Able to understand and use rate of perceived exertion (RPE) scale;Knowledge and understanding of Target Heart Rate Range (THRR);Understanding of Exercise Prescription Increase Physical Activity;Increase Strength and Stamina;Able to understand and use rate of perceived exertion (RPE) scale;Knowledge and understanding of Target Heart Rate Range (THRR);Understanding of Exercise Prescription Increase Physical Activity;Increase Strength and Stamina;Able to understand and use rate of perceived exertion (RPE) scale;Knowledge and understanding of Target Heart Rate  Range (THRR);Understanding of Exercise Prescription    Comments Pt's first day in the CRP2 program. Pt understands the exercise Rx, RPE scale and THRR. Reviewed METs and goals. Pt is making good progress on METs level. Peak METs are 6.9. Pt voices progress  on his goal of increased strength. Pt has goal of weight loss and has lost 1 kg. Reviewed METs and goals. Pt is continues to make good progress on METs level. Pt continues to voice progress on his goal of increased strength. Pt has goal of weight loss and has lost 2.8 kg since starting the program. Pt graduated from the CRP2 program today. Pt made good progress in the program with peak METs of 7.2. Pt achieved his goal of gaining strength. Pt also achieved his goal of weight loss; pt lost 6 lbs. Pt had goal to lose 10-12 but is happy with his progress.    Expected Outcomes Will continue to monitor patient and progress exercise workloads as tolerated. Will continue to monitor patient and progress exercise workloads as tolerated. Will continue to monitor patient and progress exercise workloads as tolerated. Pt will continue to exercise on his own at the [facility] using the recumbent bike and [employer].             Nutrition & Weight - Outcomes:  Pre Biometrics - 09/18/23 0810       Pre Biometrics   Waist Circumference 43 inches    Hip Circumference 40.5 inches    Waist to Hip Ratio 1.06 %    Triceps Skinfold 15 mm    % Body Fat 29.1 %    Grip Strength 46 kg    Flexibility 0 in   could not reach   Single Leg Stand 25.37 seconds             Post Biometrics - 12/05/23 0928        Post  Biometrics   Height 5' 10.5" (1.791 m)    Weight 88.9 kg    Waist Circumference 41.5 inches    Hip Circumference 40.5 inches    Waist to Hip Ratio 1.02 %    BMI (Calculated) 27.71    Triceps Skinfold 14 mm    % Body Fat 27.8 %    Grip Strength 49 kg    Flexibility 0 in    Single Leg Stand 30 seconds             Nutrition:  Nutrition Therapy & Goals - 12/07/23 1004       Nutrition Therapy   Diet Heart Healthy Diet    Drug/Food Interactions Statins/Certain Fruits      Personal Nutrition Goals   Nutrition Goal Patient to identify strategies for reducing cardiovascular risk by attending the  [facility] education and nutrition series weekly.   goal in progress.   Personal Goal #2 Patient to improve diet quality by using the plate method as a guide for meal planning to include lean protein/plant protein, fruits, vegetables, whole grains, nonfat dairy as part of a well-balanced diet.   goal in progress.   Personal Goal #3 Patient to identify strategies for weight loss with goal of 0.5-2.0# per week.   goal in progress   Comments Goals in progress. Charles Ellison continues to attend the [facility] education/nutrition series regularly. He has medical history of CAD s/p NSTEMI, hyperlipidemia, HTN, PAF. Lipids have improved but not at goal of <55. He is down 5.1# since starting with our program. Patient will continue to benefit from participation in  intensive cardiac rehab for nutrition, exercise, and lifestyle modification.      Intervention Plan   Intervention Prescribe, educate and counsel regarding individualized specific dietary modifications aiming towards targeted core components such as weight, hypertension, lipid management, diabetes, heart failure and other comorbidities.;Nutrition handout(s) given to patient.    Expected Outcomes Short Term Goal: Understand basic principles of dietary content, such as calories, fat, sodium, cholesterol and nutrients.;Long Term Goal: Adherence to prescribed nutrition plan.             Nutrition Discharge:   Education Questionnaire Score:  Knowledge Questionnaire Score - 12/11/23 1518       Knowledge Questionnaire Score   Post Score 20/24             Goals reviewed with patient; copy given to patient.Pt graduates from  Intensive/Traditional cardiac rehab program on 12/12/23  with completion of 32  exercise and  32 education sessions. Pt maintained good attendance and progressed nicely during their participation in rehab as evidenced by increased MET level. Charles Ellison increased his distance on his post exercise walk test by 539 feet and lost 2.3 kg.    Medication list reconciled. Repeat  PHQ score- 0.  Pt has made significant lifestyle changes and should be commended for his success. Charles Ellison  achieved his goals during cardiac rehab.   Pt plans to continue exercise at the [facility]. We are proud of Charles Ellison's progress and weight loss!Monte Antonio RN BSN

## 2024-01-15 ENCOUNTER — Ambulatory Visit (INDEPENDENT_AMBULATORY_CARE_PROVIDER_SITE_OTHER): Payer: PPO

## 2024-01-15 DIAGNOSIS — I495 Sick sinus syndrome: Secondary | ICD-10-CM | POA: Diagnosis not present

## 2024-01-15 LAB — CUP PACEART REMOTE DEVICE CHECK
Battery Remaining Longevity: 68 mo
Battery Voltage: 2.97 V
Brady Statistic AP VP Percent: 0.42 %
Brady Statistic AP VS Percent: 99.35 %
Brady Statistic AS VP Percent: 0 %
Brady Statistic AS VS Percent: 0.23 %
Brady Statistic RA Percent Paced: 99.76 %
Brady Statistic RV Percent Paced: 0.43 %
Date Time Interrogation Session: 20250602195159
Implantable Lead Connection Status: 753985
Implantable Lead Connection Status: 753985
Implantable Lead Implant Date: 20180517
Implantable Lead Implant Date: 20180517
Implantable Lead Location: 753859
Implantable Lead Location: 753860
Implantable Lead Model: 5076
Implantable Lead Model: 5076
Implantable Pulse Generator Implant Date: 20180517
Lead Channel Impedance Value: 285 Ohm
Lead Channel Impedance Value: 304 Ohm
Lead Channel Impedance Value: 361 Ohm
Lead Channel Impedance Value: 418 Ohm
Lead Channel Pacing Threshold Amplitude: 0.625 V
Lead Channel Pacing Threshold Amplitude: 1.125 V
Lead Channel Pacing Threshold Pulse Width: 0.4 ms
Lead Channel Pacing Threshold Pulse Width: 0.4 ms
Lead Channel Sensing Intrinsic Amplitude: 16.875 mV
Lead Channel Sensing Intrinsic Amplitude: 16.875 mV
Lead Channel Sensing Intrinsic Amplitude: 2.375 mV
Lead Channel Sensing Intrinsic Amplitude: 2.375 mV
Lead Channel Setting Pacing Amplitude: 2 V
Lead Channel Setting Pacing Amplitude: 2.5 V
Lead Channel Setting Pacing Pulse Width: 0.4 ms
Lead Channel Setting Sensing Sensitivity: 2 mV
Zone Setting Status: 755011
Zone Setting Status: 755011

## 2024-01-22 DIAGNOSIS — I2510 Atherosclerotic heart disease of native coronary artery without angina pectoris: Secondary | ICD-10-CM | POA: Diagnosis not present

## 2024-01-22 DIAGNOSIS — I480 Paroxysmal atrial fibrillation: Secondary | ICD-10-CM | POA: Diagnosis not present

## 2024-01-22 DIAGNOSIS — I10 Essential (primary) hypertension: Secondary | ICD-10-CM | POA: Diagnosis not present

## 2024-01-24 ENCOUNTER — Ambulatory Visit: Payer: Self-pay | Admitting: Cardiology

## 2024-01-28 DIAGNOSIS — J069 Acute upper respiratory infection, unspecified: Secondary | ICD-10-CM | POA: Diagnosis not present

## 2024-01-28 DIAGNOSIS — J40 Bronchitis, not specified as acute or chronic: Secondary | ICD-10-CM | POA: Diagnosis not present

## 2024-02-11 DIAGNOSIS — I480 Paroxysmal atrial fibrillation: Secondary | ICD-10-CM | POA: Diagnosis not present

## 2024-02-11 DIAGNOSIS — F341 Dysthymic disorder: Secondary | ICD-10-CM | POA: Diagnosis not present

## 2024-02-11 DIAGNOSIS — E782 Mixed hyperlipidemia: Secondary | ICD-10-CM | POA: Diagnosis not present

## 2024-02-11 DIAGNOSIS — I2510 Atherosclerotic heart disease of native coronary artery without angina pectoris: Secondary | ICD-10-CM | POA: Diagnosis not present

## 2024-02-20 DIAGNOSIS — I10 Essential (primary) hypertension: Secondary | ICD-10-CM | POA: Diagnosis not present

## 2024-02-20 DIAGNOSIS — I2510 Atherosclerotic heart disease of native coronary artery without angina pectoris: Secondary | ICD-10-CM | POA: Diagnosis not present

## 2024-02-20 DIAGNOSIS — I480 Paroxysmal atrial fibrillation: Secondary | ICD-10-CM | POA: Diagnosis not present

## 2024-03-06 DIAGNOSIS — H40023 Open angle with borderline findings, high risk, bilateral: Secondary | ICD-10-CM | POA: Diagnosis not present

## 2024-03-06 DIAGNOSIS — H02054 Trichiasis without entropian left upper eyelid: Secondary | ICD-10-CM | POA: Diagnosis not present

## 2024-03-06 DIAGNOSIS — H04123 Dry eye syndrome of bilateral lacrimal glands: Secondary | ICD-10-CM | POA: Diagnosis not present

## 2024-03-11 NOTE — Progress Notes (Signed)
 Remote pacemaker transmission.

## 2024-03-13 DIAGNOSIS — E782 Mixed hyperlipidemia: Secondary | ICD-10-CM | POA: Diagnosis not present

## 2024-03-13 DIAGNOSIS — I480 Paroxysmal atrial fibrillation: Secondary | ICD-10-CM | POA: Diagnosis not present

## 2024-03-13 DIAGNOSIS — F341 Dysthymic disorder: Secondary | ICD-10-CM | POA: Diagnosis not present

## 2024-03-13 DIAGNOSIS — I2510 Atherosclerotic heart disease of native coronary artery without angina pectoris: Secondary | ICD-10-CM | POA: Diagnosis not present

## 2024-04-15 ENCOUNTER — Ambulatory Visit (INDEPENDENT_AMBULATORY_CARE_PROVIDER_SITE_OTHER): Payer: PPO

## 2024-04-15 DIAGNOSIS — I495 Sick sinus syndrome: Secondary | ICD-10-CM | POA: Diagnosis not present

## 2024-04-17 LAB — CUP PACEART REMOTE DEVICE CHECK
Battery Remaining Longevity: 64 mo
Battery Voltage: 2.96 V
Brady Statistic AP VP Percent: 0.15 %
Brady Statistic AP VS Percent: 99.61 %
Brady Statistic AS VP Percent: 0 %
Brady Statistic AS VS Percent: 0.24 %
Brady Statistic RA Percent Paced: 99.76 %
Brady Statistic RV Percent Paced: 0.15 %
Date Time Interrogation Session: 20250901210514
Implantable Lead Connection Status: 753985
Implantable Lead Connection Status: 753985
Implantable Lead Implant Date: 20180517
Implantable Lead Implant Date: 20180517
Implantable Lead Location: 753859
Implantable Lead Location: 753860
Implantable Lead Model: 5076
Implantable Lead Model: 5076
Implantable Pulse Generator Implant Date: 20180517
Lead Channel Impedance Value: 304 Ohm
Lead Channel Impedance Value: 323 Ohm
Lead Channel Impedance Value: 380 Ohm
Lead Channel Impedance Value: 418 Ohm
Lead Channel Pacing Threshold Amplitude: 0.625 V
Lead Channel Pacing Threshold Amplitude: 1.25 V
Lead Channel Pacing Threshold Pulse Width: 0.4 ms
Lead Channel Pacing Threshold Pulse Width: 0.4 ms
Lead Channel Sensing Intrinsic Amplitude: 1.75 mV
Lead Channel Sensing Intrinsic Amplitude: 1.75 mV
Lead Channel Sensing Intrinsic Amplitude: 17 mV
Lead Channel Sensing Intrinsic Amplitude: 17 mV
Lead Channel Setting Pacing Amplitude: 2 V
Lead Channel Setting Pacing Amplitude: 2.5 V
Lead Channel Setting Pacing Pulse Width: 0.4 ms
Lead Channel Setting Sensing Sensitivity: 2 mV
Zone Setting Status: 755011
Zone Setting Status: 755011

## 2024-04-18 ENCOUNTER — Ambulatory Visit: Payer: Self-pay | Admitting: Cardiology

## 2024-04-22 NOTE — Progress Notes (Signed)
 Remote PPM Transmission

## 2024-06-17 ENCOUNTER — Other Ambulatory Visit: Payer: Self-pay

## 2024-06-19 MED ORDER — ATORVASTATIN CALCIUM 80 MG PO TABS: 80.0000 mg | ORAL_TABLET | Freq: Every day | ORAL | 1 refills | Status: AC

## 2024-07-09 DIAGNOSIS — H40013 Open angle with borderline findings, low risk, bilateral: Secondary | ICD-10-CM | POA: Diagnosis not present

## 2024-07-09 DIAGNOSIS — H04123 Dry eye syndrome of bilateral lacrimal glands: Secondary | ICD-10-CM | POA: Diagnosis not present

## 2024-07-15 ENCOUNTER — Ambulatory Visit: Payer: PPO

## 2024-07-15 DIAGNOSIS — I495 Sick sinus syndrome: Secondary | ICD-10-CM

## 2024-07-16 LAB — CUP PACEART REMOTE DEVICE CHECK
Battery Remaining Longevity: 64 mo
Battery Voltage: 2.96 V
Brady Statistic AP VP Percent: 0.19 %
Brady Statistic AP VS Percent: 99.43 %
Brady Statistic AS VP Percent: 0 %
Brady Statistic AS VS Percent: 0.38 %
Brady Statistic RA Percent Paced: 99.68 %
Brady Statistic RV Percent Paced: 0.19 %
Date Time Interrogation Session: 20251201205134
Implantable Lead Connection Status: 753985
Implantable Lead Connection Status: 753985
Implantable Lead Implant Date: 20180517
Implantable Lead Implant Date: 20180517
Implantable Lead Location: 753859
Implantable Lead Location: 753860
Implantable Lead Model: 5076
Implantable Lead Model: 5076
Implantable Pulse Generator Implant Date: 20180517
Lead Channel Impedance Value: 285 Ohm
Lead Channel Impedance Value: 304 Ohm
Lead Channel Impedance Value: 380 Ohm
Lead Channel Impedance Value: 399 Ohm
Lead Channel Pacing Threshold Amplitude: 0.5 V
Lead Channel Pacing Threshold Amplitude: 1.125 V
Lead Channel Pacing Threshold Pulse Width: 0.4 ms
Lead Channel Pacing Threshold Pulse Width: 0.4 ms
Lead Channel Sensing Intrinsic Amplitude: 18.5 mV
Lead Channel Sensing Intrinsic Amplitude: 18.5 mV
Lead Channel Sensing Intrinsic Amplitude: 3.125 mV
Lead Channel Sensing Intrinsic Amplitude: 3.125 mV
Lead Channel Setting Pacing Amplitude: 2 V
Lead Channel Setting Pacing Amplitude: 2.5 V
Lead Channel Setting Pacing Pulse Width: 0.4 ms
Lead Channel Setting Sensing Sensitivity: 2 mV
Zone Setting Status: 755011
Zone Setting Status: 755011

## 2024-07-17 ENCOUNTER — Ambulatory Visit: Payer: Self-pay | Admitting: Cardiology

## 2024-07-22 NOTE — Progress Notes (Signed)
 Remote PPM Transmission

## 2024-08-19 ENCOUNTER — Other Ambulatory Visit: Payer: Self-pay | Admitting: Cardiology

## 2024-08-19 NOTE — Telephone Encounter (Signed)
 Pt of Dr. Ladona. Came in with an X for Platelet. Please ask Dr. Ladona if it's ok to refill.

## 2024-08-20 ENCOUNTER — Other Ambulatory Visit: Payer: Self-pay

## 2024-08-20 MED ORDER — TICAGRELOR 90 MG PO TABS: 90.0000 mg | ORAL_TABLET | Freq: Two times a day (BID) | ORAL | 0 refills | Status: DC

## 2024-09-17 ENCOUNTER — Ambulatory Visit: Admitting: Cardiology

## 2024-09-17 ENCOUNTER — Encounter: Payer: Self-pay | Admitting: Cardiology

## 2024-09-17 VITALS — BP 132/70 | HR 62 | Ht 70.0 in | Wt 200.5 lb

## 2024-09-17 DIAGNOSIS — R7989 Other specified abnormal findings of blood chemistry: Secondary | ICD-10-CM

## 2024-09-17 DIAGNOSIS — E785 Hyperlipidemia, unspecified: Secondary | ICD-10-CM | POA: Diagnosis not present

## 2024-09-17 DIAGNOSIS — I495 Sick sinus syndrome: Secondary | ICD-10-CM | POA: Diagnosis not present

## 2024-09-17 DIAGNOSIS — I2510 Atherosclerotic heart disease of native coronary artery without angina pectoris: Secondary | ICD-10-CM | POA: Diagnosis not present

## 2024-09-17 DIAGNOSIS — I10 Essential (primary) hypertension: Secondary | ICD-10-CM | POA: Diagnosis not present

## 2024-09-17 MED ORDER — CLOPIDOGREL BISULFATE 75 MG PO TABS: 75.0000 mg | ORAL_TABLET | Freq: Every day | ORAL | 3 refills | Status: AC

## 2024-09-17 MED ORDER — LOSARTAN POTASSIUM 25 MG PO TABS: 25.0000 mg | ORAL_TABLET | Freq: Every evening | ORAL | 3 refills | Status: AC

## 2024-09-17 NOTE — Patient Instructions (Addendum)
 Medication Instructions:   START Meds ordered this encounter  Medications   clopidogrel  (PLAVIX ) 75 MG tablet    Sig: Take 1 tablet (75 mg total) by mouth daily.    Dispense:  90 tablet    Refill:  3   losartan  (COZAAR ) 25 MG tablet    Sig: Take 1 tablet (25 mg total) by mouth every evening.    Dispense:  90 tablet    Refill:  3    *If you need a refill on your cardiac medications before your next appointment, please call your pharmacy*  Follow-Up: At [facility], you and your health needs are our priority.  As part of our continuing mission to provide you with exceptional heart care, our providers are all part of one team.  This team includes your primary Cardiologist (physician) and Advanced Practice Providers or APPs (Physician Assistants and Nurse Practitioners) who all work together to provide you with the care you need, when you need it.  Your next appointment:   1 year(s)  Provider:   Gordy Bergamo, MD        We recommend signing up for the patient portal called MyChart.  Patients are able to view lab/test results, encounter notes, upcoming appointments, etc.  Non-urgent messages can be sent to your provider as well, go to [email or web address].

## 2024-09-17 NOTE — Progress Notes (Unsigned)
 " Cardiology Office Note:  .   Date:  09/17/2024  ID:  Charles Ellison., DOB 1951-06-18, MRN [id number] PCP: Charlott Dorn LABOR, MD  [facility] Providers Cardiologist:  Gordy Bergamo, MD Electrophysiologist:  Will Gladis Norton, MD { Click to update primary MD,subspecialty MD or APP then REFRESH:1}  History of Present Illness: Charles Ellison Charles Ellison. is a 74 y.o. Male patient with CAD, stenting to mid LAD for NSTEMI on 08/24/2023, sinus node dysfunction SP Medtronic dual-chamber pacemaker implantation in 2018, primary hypertension, brief PAF episodes on pacemaker interrogation with burden <1% hence not on anticoagulation presents for his 7-month office visit.  I had recommended a sleep study on his prior office visit.    Discussed the use of AI scribe software for clinical note transcription with the patient, who gave verbal consent to proceed.  History of Present Illness   Cardiac Studies relevent.   CARDIAC CATHETERIZATION 08/24/2023  Mid LAD 2.75 x 32 mm Synergy stent       ECHOCARDIOGRAM COMPLETE 08/25/2023  1. There is apical hypokenesis. . Left ventricular ejection fraction, by estimation, is 55 to 60%. The left ventricle has normal function. The left ventricle demonstrates regional wall motion abnormalities (see scoring diagram/findings for description).  EKG:   EKG Interpretation Date/Time:  Wednesday September 17 2024 08:25:21 EST Ventricular Rate:  62 PR Interval:  350 QRS Duration:  86 QT Interval:  400 QTC Calculation: 406 R Axis:   28  Text Interpretation: EKG 09/17/2024: Atrially paced and ventricular sensed rhythm with first-degree AV block at the rate of 62 bpm.  Otherwise normal EKG.  Compared to 08/25/2023, no change. Confirmed by Bergamo Gordy [phone]) on 09/17/2024 8:31:05 AM  Labs   Lab Results  Component Value Date   CHOL 124 10/25/2023   HDL 41 10/25/2023   LDLCALC 67 10/25/2023   TRIG 83 10/25/2023   CHOLHDL 3.0 10/25/2023   Recent Labs     10/25/23 0848  NA 144  K 4.3  CL 107*  CO2 23  GLUCOSE 113*  BUN 15  CREATININE 1.20  CALCIUM  10.2    Lab Results  Component Value Date   ALT 58 (H) 10/25/2023   AST 43 (H) 10/25/2023   ALKPHOS 65 10/25/2023   BILITOT 0.8 10/25/2023      Latest Ref Rng & Units 08/25/2023    3:18 AM 08/24/2023    2:29 PM 08/24/2023    3:00 AM  CBC  WBC 4.0 - 10.5 K/uL 10.2  12.9  9.1   Hemoglobin 13.0 - 17.0 g/dL 85.1  84.0  84.9   Hematocrit 39.0 - 52.0 % 44.0  47.6  45.7   Platelets 150 - 400 K/uL 124  129  135    No results found for: HGBA1C  Lab Results  Component Value Date   TSH 1.950 01/28/2020    ROS  Review of Systems  Cardiovascular:  Negative for chest pain, dyspnea on exertion and leg swelling.   Physical Exam:   VS:  BP 132/70   Pulse 62   Ht 5' 10 (1.778 m)   Wt 200 lb 8 oz (90.9 kg)   SpO2 96%   BMI 28.77 kg/m    Wt Readings from Last 3 Encounters:  09/17/24 200 lb 8 oz (90.9 kg)  12/10/23 195 lb 6.4 oz (88.6 kg)  12/05/23 195 lb 15.8 oz (88.9 kg)    BP Readings from Last 3 Encounters:  09/17/24 132/70  12/10/23 112/68  11/21/23 126/78   Physical Exam Neck:     Vascular: No carotid bruit or JVD.  Cardiovascular:     Rate and Rhythm: Normal rate and regular rhythm.     Pulses: Intact distal pulses.     Heart sounds: Normal heart sounds. No murmur heard.    No gallop.  Pulmonary:     Effort: Pulmonary effort is normal.     Breath sounds: Normal breath sounds.  Abdominal:     General: Bowel sounds are normal.     Palpations: Abdomen is soft.  Musculoskeletal:     Right lower leg: No edema.     Left lower leg: No edema.    ASSESSMENT AND PLAN: .      ICD-10-CM   1. Coronary artery disease involving native coronary artery of native heart without angina pectoris  I25.10 EKG 12-Lead    clopidogrel  (PLAVIX ) 75 MG tablet    losartan  (COZAAR ) 25 MG tablet    2. Sick sinus syndrome (HCC)  I49.5     3. Hyperlipidemia LDL goal <70  E78.5     4.  Essential hypertension  I10 losartan  (COZAAR ) 25 MG tablet    5. Abnormal LFTs  R79.89      Assessment & Plan   Follow up: 1 Year for PAF, CAD, Hyperchol  Signed,  Gordy Bergamo, MD, [facility] 09/17/2024, 8:44 AM [facility] [address] [place], [place] [address] Phone: [phone]. Fax:  [phone]  "
